# Patient Record
Sex: Male | Born: 1970 | Race: Black or African American | Hispanic: No | Smoking: Never smoker
Health system: Southern US, Community
[De-identification: ages and names within clinical notes are randomized; demographics above are authoritative.]

## PROBLEM LIST (undated history)

## (undated) DIAGNOSIS — I3139 Other pericardial effusion (noninflammatory): Secondary | ICD-10-CM

## (undated) DIAGNOSIS — R569 Unspecified convulsions: Secondary | ICD-10-CM

## (undated) DIAGNOSIS — D649 Anemia, unspecified: Secondary | ICD-10-CM

## (undated) DIAGNOSIS — E785 Hyperlipidemia, unspecified: Secondary | ICD-10-CM

## (undated) DIAGNOSIS — I714 Abdominal aortic aneurysm, without rupture, unspecified: Secondary | ICD-10-CM

## (undated) DIAGNOSIS — N289 Disorder of kidney and ureter, unspecified: Secondary | ICD-10-CM

## (undated) DIAGNOSIS — I214 Non-ST elevation (NSTEMI) myocardial infarction: Secondary | ICD-10-CM

## (undated) DIAGNOSIS — I10 Essential (primary) hypertension: Secondary | ICD-10-CM

## (undated) DIAGNOSIS — N186 End stage renal disease: Secondary | ICD-10-CM

## (undated) DIAGNOSIS — I639 Cerebral infarction, unspecified: Secondary | ICD-10-CM

## (undated) DIAGNOSIS — I5042 Chronic combined systolic (congestive) and diastolic (congestive) heart failure: Secondary | ICD-10-CM

## (undated) DIAGNOSIS — K219 Gastro-esophageal reflux disease without esophagitis: Secondary | ICD-10-CM

## (undated) DIAGNOSIS — N051 Unspecified nephritic syndrome with focal and segmental glomerular lesions: Secondary | ICD-10-CM

## (undated) DIAGNOSIS — I313 Pericardial effusion (noninflammatory): Secondary | ICD-10-CM

## (undated) DIAGNOSIS — Z992 Dependence on renal dialysis: Secondary | ICD-10-CM

## (undated) HISTORY — DX: End stage renal disease: N18.6

## (undated) HISTORY — DX: Abdominal aortic aneurysm, without rupture, unspecified: I71.40

## (undated) HISTORY — DX: Abdominal aortic aneurysm, without rupture: I71.4

## (undated) HISTORY — PX: ABDOMINAL AORTIC ANEURYSM REPAIR: SUR1152

## (undated) HISTORY — DX: Pericardial effusion (noninflammatory): I31.3

## (undated) HISTORY — PX: KIDNEY SURGERY: SHX687

## (undated) HISTORY — DX: Other pericardial effusion (noninflammatory): I31.39

---

## 1997-11-30 ENCOUNTER — Emergency Department (HOSPITAL_COMMUNITY): Admission: EM | Admit: 1997-11-30 | Discharge: 1997-11-30 | Payer: Self-pay | Admitting: Emergency Medicine

## 2016-03-20 DIAGNOSIS — Z01818 Encounter for other preprocedural examination: Secondary | ICD-10-CM | POA: Insufficient documentation

## 2016-03-20 DIAGNOSIS — I723 Aneurysm of iliac artery: Secondary | ICD-10-CM | POA: Insufficient documentation

## 2016-07-21 DIAGNOSIS — I714 Abdominal aortic aneurysm, without rupture, unspecified: Secondary | ICD-10-CM | POA: Insufficient documentation

## 2016-09-13 DIAGNOSIS — Z8673 Personal history of transient ischemic attack (TIA), and cerebral infarction without residual deficits: Secondary | ICD-10-CM | POA: Insufficient documentation

## 2016-09-13 DIAGNOSIS — R002 Palpitations: Secondary | ICD-10-CM | POA: Insufficient documentation

## 2017-02-06 DIAGNOSIS — N051 Unspecified nephritic syndrome with focal and segmental glomerular lesions: Secondary | ICD-10-CM | POA: Insufficient documentation

## 2017-02-13 DIAGNOSIS — R5084 Febrile nonhemolytic transfusion reaction: Secondary | ICD-10-CM | POA: Insufficient documentation

## 2017-02-14 DIAGNOSIS — R0689 Other abnormalities of breathing: Secondary | ICD-10-CM | POA: Insufficient documentation

## 2017-02-14 DIAGNOSIS — G8918 Other acute postprocedural pain: Secondary | ICD-10-CM | POA: Insufficient documentation

## 2017-06-20 ENCOUNTER — Emergency Department (HOSPITAL_COMMUNITY): Payer: Medicare Other

## 2017-06-20 ENCOUNTER — Inpatient Hospital Stay (HOSPITAL_COMMUNITY)
Admission: EM | Admit: 2017-06-20 | Discharge: 2017-06-20 | DRG: 291 | Discharge status: Against Medical Advice | Payer: Medicare Other | Attending: Internal Medicine | Admitting: Internal Medicine

## 2017-06-20 ENCOUNTER — Encounter (HOSPITAL_COMMUNITY): Payer: Self-pay | Admitting: Emergency Medicine

## 2017-06-20 ENCOUNTER — Other Ambulatory Visit: Payer: Self-pay

## 2017-06-20 DIAGNOSIS — E785 Hyperlipidemia, unspecified: Secondary | ICD-10-CM

## 2017-06-20 DIAGNOSIS — I5033 Acute on chronic diastolic (congestive) heart failure: Secondary | ICD-10-CM | POA: Diagnosis present

## 2017-06-20 DIAGNOSIS — R0602 Shortness of breath: Secondary | ICD-10-CM | POA: Diagnosis present

## 2017-06-20 DIAGNOSIS — I132 Hypertensive heart and chronic kidney disease with heart failure and with stage 5 chronic kidney disease, or end stage renal disease: Secondary | ICD-10-CM | POA: Diagnosis present

## 2017-06-20 DIAGNOSIS — E876 Hypokalemia: Secondary | ICD-10-CM | POA: Diagnosis present

## 2017-06-20 DIAGNOSIS — R7989 Other specified abnormal findings of blood chemistry: Secondary | ICD-10-CM | POA: Diagnosis present

## 2017-06-20 DIAGNOSIS — I160 Hypertensive urgency: Secondary | ICD-10-CM | POA: Diagnosis present

## 2017-06-20 DIAGNOSIS — E8770 Fluid overload, unspecified: Secondary | ICD-10-CM | POA: Diagnosis present

## 2017-06-20 DIAGNOSIS — N186 End stage renal disease: Secondary | ICD-10-CM

## 2017-06-20 DIAGNOSIS — Z992 Dependence on renal dialysis: Secondary | ICD-10-CM

## 2017-06-20 DIAGNOSIS — I639 Cerebral infarction, unspecified: Secondary | ICD-10-CM | POA: Diagnosis present

## 2017-06-20 DIAGNOSIS — Z8249 Family history of ischemic heart disease and other diseases of the circulatory system: Secondary | ICD-10-CM

## 2017-06-20 DIAGNOSIS — E875 Hyperkalemia: Secondary | ICD-10-CM | POA: Diagnosis present

## 2017-06-20 DIAGNOSIS — K5900 Constipation, unspecified: Secondary | ICD-10-CM | POA: Diagnosis present

## 2017-06-20 DIAGNOSIS — G40909 Epilepsy, unspecified, not intractable, without status epilepticus: Secondary | ICD-10-CM | POA: Diagnosis present

## 2017-06-20 DIAGNOSIS — D631 Anemia in chronic kidney disease: Secondary | ICD-10-CM | POA: Diagnosis present

## 2017-06-20 DIAGNOSIS — Z8673 Personal history of transient ischemic attack (TIA), and cerebral infarction without residual deficits: Secondary | ICD-10-CM | POA: Diagnosis not present

## 2017-06-20 DIAGNOSIS — K219 Gastro-esophageal reflux disease without esophagitis: Secondary | ICD-10-CM | POA: Diagnosis present

## 2017-06-20 DIAGNOSIS — R569 Unspecified convulsions: Secondary | ICD-10-CM

## 2017-06-20 DIAGNOSIS — R945 Abnormal results of liver function studies: Secondary | ICD-10-CM | POA: Diagnosis not present

## 2017-06-20 HISTORY — DX: Unspecified nephritic syndrome with focal and segmental glomerular lesions: N05.1

## 2017-06-20 HISTORY — DX: Cerebral infarction, unspecified: I63.9

## 2017-06-20 HISTORY — DX: Dependence on renal dialysis: N18.6

## 2017-06-20 HISTORY — DX: Unspecified convulsions: R56.9

## 2017-06-20 HISTORY — DX: Essential (primary) hypertension: I10

## 2017-06-20 HISTORY — DX: Hyperlipidemia, unspecified: E78.5

## 2017-06-20 HISTORY — DX: Gastro-esophageal reflux disease without esophagitis: K21.9

## 2017-06-20 HISTORY — DX: Disorder of kidney and ureter, unspecified: N28.9

## 2017-06-20 HISTORY — DX: Dependence on renal dialysis: Z99.2

## 2017-06-20 LAB — CBC WITH DIFFERENTIAL/PLATELET
BASOS PCT: 0 %
Basophils Absolute: 0 10*3/uL (ref 0.0–0.1)
EOS ABS: 0.3 10*3/uL (ref 0.0–0.7)
EOS PCT: 3 %
HCT: 20.9 % — ABNORMAL LOW (ref 39.0–52.0)
Hemoglobin: 7 g/dL — ABNORMAL LOW (ref 13.0–17.0)
Lymphocytes Relative: 19 %
Lymphs Abs: 1.5 10*3/uL (ref 0.7–4.0)
MCH: 28.7 pg (ref 26.0–34.0)
MCHC: 33.5 g/dL (ref 30.0–36.0)
MCV: 85.7 fL (ref 78.0–100.0)
MONO ABS: 0.6 10*3/uL (ref 0.1–1.0)
MONOS PCT: 7 %
Neutro Abs: 5.6 10*3/uL (ref 1.7–7.7)
Neutrophils Relative %: 71 %
Platelets: 222 10*3/uL (ref 150–400)
RBC: 2.44 MIL/uL — ABNORMAL LOW (ref 4.22–5.81)
RDW: 16.4 % — AB (ref 11.5–15.5)
WBC: 8 10*3/uL (ref 4.0–10.5)

## 2017-06-20 LAB — COMPREHENSIVE METABOLIC PANEL
ALBUMIN: 3.2 g/dL — AB (ref 3.5–5.0)
ALT: 468 U/L — ABNORMAL HIGH (ref 17–63)
ANION GAP: 21 — AB (ref 5–15)
AST: 177 U/L — ABNORMAL HIGH (ref 15–41)
Alkaline Phosphatase: 115 U/L (ref 38–126)
BUN: 139 mg/dL — ABNORMAL HIGH (ref 6–20)
CO2: 19 mmol/L — AB (ref 22–32)
Calcium: 9.4 mg/dL (ref 8.9–10.3)
Chloride: 99 mmol/L — ABNORMAL LOW (ref 101–111)
Creatinine, Ser: 18.19 mg/dL — ABNORMAL HIGH (ref 0.61–1.24)
GFR calc non Af Amer: 3 mL/min — ABNORMAL LOW (ref >60)
GFR, EST AFRICAN AMERICAN: 3 mL/min — AB (ref >60)
GLUCOSE: 95 mg/dL (ref 65–99)
POTASSIUM: 5.2 mmol/L — AB (ref 3.5–5.1)
SODIUM: 139 mmol/L (ref 135–145)
TOTAL PROTEIN: 6.6 g/dL (ref 6.5–8.1)
Total Bilirubin: 1.1 mg/dL (ref 0.3–1.2)

## 2017-06-20 LAB — HEPATITIS PANEL, ACUTE
HEP B S AG: NEGATIVE
Hep A IgM: NEGATIVE
Hep B C IgM: NEGATIVE

## 2017-06-20 LAB — ABO/RH: ABO/RH(D): A POS

## 2017-06-20 LAB — ACETAMINOPHEN LEVEL: Acetaminophen (Tylenol), Serum: <10 ug/mL — ABNORMAL LOW (ref 10–30)

## 2017-06-20 LAB — TYPE AND SCREEN
ABO/RH(D): A POS
ANTIBODY SCREEN: NEGATIVE

## 2017-06-20 LAB — HIV ANTIBODY (ROUTINE TESTING W REFLEX): HIV Screen 4th Generation wRfx: NONREACTIVE

## 2017-06-20 MED ORDER — SEVELAMER CARBONATE 800 MG PO TABS
3200.0000 mg | ORAL_TABLET | Freq: Three times a day (TID) | ORAL | Status: DC
  Filled 2017-06-20 (×4): qty 4

## 2017-06-20 MED ORDER — NITROGLYCERIN IN D5W 200-5 MCG/ML-% IV SOLN
0.0000 ug/min | Freq: Once | INTRAVENOUS | Status: DC
  Filled 2017-06-20: qty 250

## 2017-06-20 MED ORDER — SENNOSIDES-DOCUSATE SODIUM 8.6-50 MG PO TABS: 1.0000 | ORAL_TABLET | Freq: Every evening | ORAL | Status: DC | PRN

## 2017-06-20 MED ORDER — HYDRALAZINE HCL 25 MG PO TABS
100.0000 mg | ORAL_TABLET | Freq: Three times a day (TID) | ORAL | Status: DC
  Administered 2017-06-20: 100 mg via ORAL
  Filled 2017-06-20: qty 4

## 2017-06-20 MED ORDER — LISINOPRIL 20 MG PO TABS: 20.0000 mg | ORAL_TABLET | Freq: Every day | ORAL | Status: DC

## 2017-06-20 MED ORDER — ATORVASTATIN CALCIUM 40 MG PO TABS
40.0000 mg | ORAL_TABLET | Freq: Every day | ORAL | Status: DC
  Filled 2017-06-20: qty 1

## 2017-06-20 MED ORDER — LEVETIRACETAM 500 MG PO TABS: 500.0000 mg | ORAL_TABLET | Freq: Two times a day (BID) | ORAL | Status: DC

## 2017-06-20 MED ORDER — HYDRALAZINE HCL 20 MG/ML IJ SOLN
10.0000 mg | Freq: Once | INTRAMUSCULAR | Status: DC
Start: 2017-06-20 — End: 2017-06-20
  Filled 2017-06-20: qty 1

## 2017-06-20 MED ORDER — PANTOPRAZOLE SODIUM 40 MG PO TBEC: 40.0000 mg | DELAYED_RELEASE_TABLET | Freq: Every day | ORAL | Status: DC

## 2017-06-20 MED ORDER — CALCITRIOL 0.5 MCG PO CAPS
0.5000 ug | ORAL_CAPSULE | Freq: Every day | ORAL | Status: DC
  Filled 2017-06-20: qty 1

## 2017-06-20 MED ORDER — HYDRALAZINE HCL 20 MG/ML IJ SOLN
10.0000 mg | Freq: Once | INTRAMUSCULAR | Status: AC
  Administered 2017-06-20: 10 mg via INTRAVENOUS

## 2017-06-20 MED ORDER — VITAMIN D 1000 UNITS PO TABS: 1000.0000 [IU] | ORAL_TABLET | Freq: Every day | ORAL | Status: DC

## 2017-06-20 MED ORDER — CLONIDINE HCL 0.2 MG PO TABS: 0.2000 mg | ORAL_TABLET | Freq: Two times a day (BID) | ORAL | Status: DC

## 2017-06-20 MED ORDER — HEPARIN SODIUM (PORCINE) 5000 UNIT/ML IJ SOLN: 5000.0000 [IU] | Freq: Three times a day (TID) | INTRAMUSCULAR | Status: DC

## 2017-06-20 MED ORDER — ZOLPIDEM TARTRATE 5 MG PO TABS: 5.0000 mg | ORAL_TABLET | Freq: Every evening | ORAL | Status: DC | PRN

## 2017-06-20 MED ORDER — ASPIRIN EC 81 MG PO TBEC: 81.0000 mg | DELAYED_RELEASE_TABLET | Freq: Every day | ORAL | Status: DC

## 2017-06-20 MED ORDER — POLYETHYLENE GLYCOL 3350 17 G PO PACK: 17.0000 g | PACK | Freq: Every day | ORAL | Status: DC

## 2017-06-20 MED ORDER — FUROSEMIDE 20 MG PO TABS: 80.0000 mg | ORAL_TABLET | Freq: Every day | ORAL | Status: DC

## 2017-06-20 MED ORDER — CARVEDILOL 12.5 MG PO TABS: 25.0000 mg | ORAL_TABLET | Freq: Two times a day (BID) | ORAL | Status: DC

## 2017-06-20 MED ORDER — HYDRALAZINE HCL 20 MG/ML IJ SOLN: 5.0000 mg | INTRAMUSCULAR | Status: DC | PRN

## 2017-06-20 MED ORDER — ONDANSETRON HCL 4 MG/2ML IJ SOLN: 4.0000 mg | Freq: Three times a day (TID) | INTRAMUSCULAR | Status: DC | PRN

## 2017-06-20 NOTE — ED Triage Notes (Signed)
Pt st's he is from  [place] and has not been to dialysis since last Thursday.  Pt to ED with c/o shortness of breath and non productive cough

## 2017-06-20 NOTE — ED Notes (Signed)
Another RN attempted to start IV on pt. Pt refused to let the RN attempt IV access.  States he needs treatment soon because he "needs to go back to [place]".  RN explained that pt's treatment involves establishing an IV.  Pt continues to refuse.  EDP Rancour notified.

## 2017-06-20 NOTE — ED Notes (Signed)
EDP Rancour at bedside  

## 2017-06-20 NOTE — ED Notes (Signed)
IV team RN at bedside with primary RN.  Pt continues to refuse treatment. Pt states "if I can't be guaranteed 100% that I will be on the dialysis machine by 7a then I cannot stay".  Advised pt that his blood pressure is dangerously high and the risks associated.  Pt states "im not worried about that blood pressure, it's always that high or higher"  Pt states he needs to return to [place] by noon to be with his son.  Pt only consents to any treatment he can receive for constipation but declines all other treatment at this time.  EDP Rancour notified.

## 2017-06-20 NOTE — ED Provider Notes (Signed)
[facility] EMERGENCY DEPARTMENT Provider Note   CSN: [id number] Arrival date & time: 06/20/17  0000     History   Chief Complaint Chief Complaint  Patient presents with  . Shortness of Breath    HPI Angel [name] is a 46 y.o. male.  Patient visiting from [place].  Feels like he needs dialysis.  He is normally Tuesday, Thursday, Saturday and has missed his last 2 sessions.  He feels he is about 2 kg over his dry weight and is having some shortness of breath and chest congestion.  Denies any cough or fever.  Still makes some urine.  Denies any chest pain.  Denies any abdominal pain, nausea or vomiting.  He is hypertensive but states compliance with his medications and states he normally has difficult to control blood pressure. Patient had a recent open aortic aneurysm repair in July at [facility].   The history is provided by the patient.  Shortness of Breath  Pertinent negatives include no fever, no headaches, no rhinorrhea, no chest pain, no vomiting, no abdominal pain, no rash and no leg swelling.    Past Medical History:  Diagnosis Date  . CHF (congestive heart failure) ([place])   . Hypertension   . Renal disorder     There are no active problems to display for this patient.   Past Surgical History:  Procedure Laterality Date  . ABDOMINAL AORTIC ANEURYSM REPAIR    . KIDNEY SURGERY         Home Medications    Prior to Admission medications   Not on File    Family History No family history on file.  Social History Social History   Tobacco Use  . Smoking status: Never Smoker  . Smokeless tobacco: Never Used  Substance Use Topics  . Alcohol use: No    Frequency: Never  . Drug use: No     Allergies   Patient has no allergy information on record.   Review of Systems Review of Systems  Constitutional: Positive for activity change, appetite change and fatigue. Negative for fever.  HENT: Negative for congestion and  rhinorrhea.   Eyes: Negative for visual disturbance.  Respiratory: Positive for chest tightness and shortness of breath.   Cardiovascular: Negative for chest pain and leg swelling.  Gastrointestinal: Negative for abdominal pain, nausea and vomiting.  Genitourinary: Negative for dysuria and hematuria.  Musculoskeletal: Negative for arthralgias.  Skin: Negative for rash.  Neurological: Negative for dizziness, weakness and headaches.    all other systems are negative except as noted in the HPI and PMH.    Physical Exam Updated Vital Signs BP (!) 173/137   Pulse 89   Temp 98.2 F (36.8 C) (Oral)   Resp (!) 23   Ht 5\' 5"  (1.651 m)   Wt 58 kg (127 lb 13.9 oz)   SpO2 98%   BMI 21.28 kg/m   Physical Exam  Constitutional: He is oriented to person, place, and time. He appears well-developed and well-nourished. No distress.  Speaking in full sentences  HENT:  Head: Normocephalic and atraumatic.  Mouth/Throat: Oropharynx is clear and moist. No oropharyngeal exudate.  Eyes: Conjunctivae and EOM are normal. Pupils are equal, round, and reactive to light.  Neck: Normal range of motion. Neck supple.  No meningismus.  Cardiovascular: Normal rate, regular rhythm, normal heart sounds and intact distal pulses.  No murmur heard. Pulmonary/Chest: Effort normal and breath sounds normal. No respiratory distress.  Bibasilar crackles Dialysis catheter R chest  Abdominal: Soft. There is no tenderness. There is no rebound and no guarding.  Well healed midline abdominal incision  Musculoskeletal: Normal range of motion. He exhibits edema. He exhibits no tenderness.  +1 pretibial edema  Neurological: He is alert and oriented to person, place, and time. No cranial nerve deficit. He exhibits normal muscle tone. Coordination normal.  No ataxia on finger to nose bilaterally. No pronator drift. 5/5 strength throughout. CN 2-12 intact.Equal grip strength. Sensation intact.   Skin: Skin is warm.    Psychiatric: He has a normal mood and affect. His behavior is normal.  Nursing note and vitals reviewed.    ED Treatments / Results  Labs (all labs ordered are listed, but only abnormal results are displayed) Labs Reviewed  CBC WITH DIFFERENTIAL/PLATELET - Abnormal; Notable for the following components:      Result Value   RBC 2.44 (*)    Hemoglobin 7.0 (*)    HCT 20.9 (*)    RDW 16.4 (*)    All other components within normal limits  COMPREHENSIVE METABOLIC PANEL - Abnormal; Notable for the following components:   Potassium 5.2 (*)    Chloride 99 (*)    CO2 19 (*)    BUN 139 (*)    Creatinine, Ser 18.19 (*)    Albumin 3.2 (*)    AST 177 (*)    ALT 468 (*)    GFR calc non Af Amer 3 (*)    GFR calc Af Amer 3 (*)    Anion gap 21 (*)    All other components within normal limits  TYPE AND SCREEN  ABO/RH    EKG  EKG Interpretation  Date/Time:  Wednesday June 20 2017 00:11:14 EST Ventricular Rate:  101 PR Interval:  118 QRS Duration: 86 QT Interval:  358 QTC Calculation: 464 R Axis:   -12 Text Interpretation:  Sinus tachycardia Minimal voltage criteria for LVH, may be normal variant Nonspecific ST and T wave abnormality Abnormal ECG No previous ECGs available Confirmed by Ezequiel Essex [phone]) on 06/20/2017 4:02:28 AM       Radiology Dg Chest 2 View  Result Date: 06/20/2017 CLINICAL DATA:  Acute onset of shortness of breath. EXAM: CHEST  2 VIEW COMPARISON:  None. FINDINGS: The lungs are well-aerated and clear. There is no evidence of focal opacification, pleural effusion or pneumothorax. The heart is mildly enlarged. A right-sided dual-lumen catheter is noted ending at the right atrium. No acute osseous abnormalities are seen. IMPRESSION: Mild cardiomegaly.  Lungs remain grossly clear. Electronically Signed   By: Garald Balding M.D.   On: 06/20/2017 01:43    Procedures Procedures (including critical care time)  Medications Ordered in ED Medications   hydrALAZINE (APRESOLINE) injection 10 mg (not administered)     Initial Impression / Assessment and Plan / ED Course  I have reviewed the triage vital signs and the nursing notes.  Pertinent labs & imaging results that were available during my care of the patient were reviewed by me and considered in my medical decision making (see chart for details).    Patient requesting dialysis.  States he has missed his last 2 sessions and feels short of breath and 2 kg over his dry weight.  No chest pain.  He is in not in any respiratory distress.  He is not hypoxic.  His blood pressure is elevated. Chest x-ray does not show any pleural effusions.  There is minimal edema.  Potassium is 5.2 without EKG changes.  Hemoglobin is  7.0 which appears to be similar to previous values in care everywhere.  Hydralazine given and NTG started for hypertensive urgency.  D/w Dr. Jimmy Footman of nephrology who will arrange for dialysis.   Admission d/w Dr. Blaine Hamper.  Patient states he will not stay if he cannot have dialysis by 8am. He was informed that this cannot be guaranteed.   CRITICAL CARE Performed by: Ezequiel Essex Total critical care time: 31 minutes Critical care time was exclusive of separately billable procedures and treating other patients. Critical care was necessary to treat or prevent imminent or life-threatening deterioration. Critical care was time spent personally by me on the following activities: development of treatment plan with patient and/or surrogate as well as nursing, discussions with consultants, evaluation of patient's response to treatment, examination of patient, obtaining history from patient or surrogate, ordering and performing treatments and interventions, ordering and review of laboratory studies, ordering and review of radiographic studies, pulse oximetry and re-evaluation of patient's condition.  Final Clinical Impressions(s) / ED Diagnoses   Final diagnoses:  Hypervolemia,  unspecified hypervolemia type  Hyperkalemia  Hypertensive urgency    ED Discharge Orders    None       Marshell Dilauro, Annie Main, MD 06/20/17 [phone]

## 2017-06-20 NOTE — Discharge Summary (Signed)
Physician Discharge Summary  Angel [name] MWN:[id number] DOB: [birth year] DOA: 06/20/2017  PCP: Gwenlyn Saran, MD  Admit date: 06/20/2017 Discharge date: 06/20/2017  Time spent: 35 minutes  Recommendations for Outpatient Follow-up:   Did not make follow-up appointment since pt left hospital on AMA, I did not have chance to see pt before he left.  Discharge Diagnoses:  Principal Problem:   Fluid overload Active Problems:   HLD (hyperlipidemia)   Stroke (cerebrum) ([name])   ESRD on dialysis ([name])   Acute on chronic diastolic CHF (congestive heart failure) ([name])   Hypertensive urgency   Hyperkalemia   GERD (gastroesophageal reflux disease)   Seizure ([name])   Abnormal LFTs   Anemia in ESRD (end-stage renal disease) ([place])   Discharge Condition: still has SOB and elevated Bp at 179/138, hemodialysis was not done.  Diet recommendation:  Did not make recommendations since pt left hospital on AMA, I did not have chance to see pt before he left.  Filed Weights   06/20/17 0029  Weight: 58 kg (127 lb 13.9 oz)    History of present illness:  Angel [name] is a 46 y.o. male with medical history significant of ESRD-HD, hypertension, hyperlipidemia, stroke, GERD, CHF, seizure, s/p of AAA repair, who presents with SOB.  Patient states that he has missed his last 2 sessions of hemodialysis.He feels he is about 2 kg over his dry weight. He developed shortness rest, which has been progressively getting worse. He also has chest congestion, but no chest pain. He has a mild dry cough, but no fever or chills. Denies nausea, vomiting, diarrhea or abdominal pain. He states that he is constipated recently. No symptoms of UTI or unilateral weakness. Patient had a recent open aortic aneurysm repair in July at [facility].  ED Course: pt was found to have potassium of 5.2, bicarbonate 19, creatinine 18.19, BUN 139, WBC 8.0, hemoglobin 7.0 which was 9.9 on 02/17/17, abnormal liver function with  ALP 115, AST 177, ALT 468, total bilirubin 1.1, elevated blood pressure 198/143, tachycardia, tachypnea, oxygen saturation 98% on room air, temperature normal. Chest x-rays negative for infiltration, but showed mild cardiomegaly. Patient is admitted to stepdown as inpatient.  Hospital Course:   Pt was admitted to SDU as inpt. Assessment & plan was made as the followings, but unfortunately patient left hospital on AMA. I did not have chance to see pt before he left. Per EDP's note, "patient did not want to wait any longer in the emergency department for his dialysis.  No evidence of psychosis.  He decided independently to leave [place]".   Fluid overload and acute on chronic diastolic CHF: Patient missed 2 sessions of dialysis, presented with SOB. Most likely due to fluid overload given  1+ leg edema and positive JVD. He also has hypertensive urgency with blood pressure 189/143. Patient does not have chest pain. 2-D Echo 11/80/17 showed EF of 50-55 percent  -will admit to SDU -nitro gtt was ordered -dialysis per renal  -Bp control as below  ESRD on dialysis (TTS): missed 2 sessions of HD. 5.2, bicarbonate 19, creatinine 18.19, BUN 139 -Left message to renal box for HD -Continue Renvela and Calcitriol  Mild hypokalemia: potassium 5.2 -Per dialysis  Hypertensive urgency: Patient states that he has been compliant to taking his medications. -IV hydralazine when necessary -Continue home blood pressure medications: Coreg, clonidine, hydralazine, lisinopril -Patient is also on Lasix  HLD: -lipitor  Hx of Stroke (cerebrum) ([name]) -on ASA and lipitor  GERD: -Protonix  Seizure:  -Seizure precaution -Continue Keppra -check Keppra level  Abnormal LFTs: Etiology is not clear. ALP 115, AST 177, ALT 468, total bilirubin 1.1, -Check Tylenol level -Hepatitis panel  Anemia in ESRD (end-stage renal disease) ([place]): Hemoglobin dropped from 9.9 on 02/17/17-7.0. No active  bleeding -Check FOBT -Follow-up by CBC  Constipation: -MiraLAX and senokote   Procedures:  none  Consultations:  Renal for hemodialysis, but hemodialysis was not done yet  Discharge Exam: Vitals:   06/20/17 0600 06/20/17 0615  BP: (!) 179/138 (!) 179/139  Pulse: 93 92  Resp: 20 (!) 24  Temp:    SpO2: 100% 99%     I did not have chance to to physical examination since pt left hospital on [facility]   Discharge Instructions  Did not give instruction since pt left hospital on AMA, I did not have chance to see pt before he left.   Current Discharge Medication List    CONTINUE these medications which have NOT CHANGED   Details  aspirin EC 81 MG tablet Take 81 mg daily by mouth.    atorvastatin (LIPITOR) 40 MG tablet Take 40 mg at bedtime by mouth.    carvedilol (COREG) 25 MG tablet Take 25 mg 2 (two) times daily with a meal by mouth.    cloNIDine (CATAPRES) 0.2 MG tablet Take 0.2 mg 2 (two) times daily by mouth.    furosemide (LASIX) 80 MG tablet Take 80 mg at bedtime by mouth.    hydrALAZINE (APRESOLINE) 50 MG tablet Take 100 mg 3 (three) times daily by mouth.    lisinopril (PRINIVIL,ZESTRIL) 40 MG tablet Take 40 mg daily by mouth.       No Known Allergies    The results of significant diagnostics from this hospitalization (including imaging, microbiology, ancillary and laboratory) are listed below for reference.    Significant Diagnostic Studies: Dg Chest 2 View  Result Date: 06/20/2017 CLINICAL DATA:  Acute onset of shortness of breath. EXAM: CHEST  2 VIEW COMPARISON:  None. FINDINGS: The lungs are well-aerated and clear. There is no evidence of focal opacification, pleural effusion or pneumothorax. The heart is mildly enlarged. A right-sided dual-lumen catheter is noted ending at the right atrium. No acute osseous abnormalities are seen. IMPRESSION: Mild cardiomegaly.  Lungs remain grossly clear. Electronically Signed   By: Garald Balding M.D.   On: 06/20/2017  01:43    Microbiology: No results found for this or any previous visit (from the past 240 hour(s)).   Labs: Basic Metabolic Panel: Recent Labs  Lab 06/20/17 0036  NA 139  K 5.2*  CL 99*  CO2 19*  GLUCOSE 95  BUN 139*  CREATININE 18.19*  CALCIUM 9.4   Liver Function Tests: Recent Labs  Lab 06/20/17 0036  AST 177*  ALT 468*  ALKPHOS 115  BILITOT 1.1  PROT 6.6  ALBUMIN 3.2*   No results for input(s): LIPASE, AMYLASE in the last 168 hours. No results for input(s): AMMONIA in the last 168 hours. CBC: Recent Labs  Lab 06/20/17 0036  WBC 8.0  NEUTROABS 5.6  HGB 7.0*  HCT 20.9*  MCV 85.7  PLT 222   Cardiac Enzymes: No results for input(s): CKTOTAL, CKMB, CKMBINDEX, TROPONINI in the last 168 hours. BNP: BNP (last 3 results) No results for input(s): BNP in the last 8760 hours.  ProBNP (last 3 results) No results for input(s): PROBNP in the last 8760 hours.  CBG: No results for input(s): GLUCAP in the last 168 hours.  Signed:  Ivor Costa  [facility] 06/20/2017, 5:26 PM

## 2017-06-20 NOTE — ED Notes (Signed)
RN attempted IV without success.  Pt states he was "stuck  27 times" last admission and would prefer IV team to assist with access.

## 2017-06-20 NOTE — H&P (Signed)
History and Physical    Angel [name] SVX:[id number] DOB: 09-Apr-1971 DOA: 06/20/2017  Referring MD/NP/PA:   PCP: Gwenlyn Saran, MD   Patient coming from:  The patient is coming from home.  At baseline, pt is independent for most of ADL.   Chief Complaint: SOB  HPI: Angel [name] is a 46 y.o. male with medical history significant of ESRD-HD, hypertension, hyperlipidemia, stroke, GERD, CHF, seizure, s/p of AAA repair, who presents with SOB.  Patient states that he has missed his last 2 sessions of hemodialysis. He feels he is about 2 kg over his dry weight. He developed shortness rest, which has been progressively getting worse. He also has chest congestion, but no chest pain. He has a mild dry cough, but no fever or chills. Denies nausea, vomiting, diarrhea or abdominal pain. He states that he is constipated recently. No symptoms of UTI or unilateral weakness. Patient had a recent open aortic aneurysm repair in July at [facility].  ED Course: pt was found to have potassium of 5.2, bicarbonate 19, creatinine 18.19, BUN 139, WBC 8.0, hemoglobin 7.0 which was 9.9 on 02/17/17, abnormal liver function with ALP 115, AST 177, ALT 468, total bilirubin 1.1, elevated blood pressure 198/143, tachycardia, tachypnea, oxygen saturation 98% on room air, temperature normal. Chest x-rays negative for infiltration, but showed mild cardiomegaly. Patient is admitted to stepdown as inpatient.  Review of Systems:   General: no fevers, chills, no body weight gain, has poor appetite, has fatigue HEENT: no blurry vision, hearing changes or sore throat Respiratory: has dyspnea, coughing, no wheezing CV: no chest pain, no palpitations GI: no nausea, vomiting, abdominal pain, diarrhea, has constipation GU: no dysuria, burning on urination, increased urinary frequency, hematuria  Ext: has leg edema Neuro: no unilateral weakness, numbness, or tingling, no vision change or hearing loss Skin: no rash, no  skin tear. MSK: No muscle spasm, no deformity, no limitation of range of movement in spin Heme: No easy bruising.  Travel history: No recent long distant travel.  Allergy: No Known Allergies  Past Medical History:  Diagnosis Date  . CHF (congestive heart failure) ([place])   . ESRD on dialysis ([place])   . FSGS (focal segmental glomerulosclerosis)   . GERD (gastroesophageal reflux disease)   . HLD (hyperlipidemia)   . Hypertension   . Renal disorder   . Seizure ([place])   . Stroke (cerebrum) [facility])     Past Surgical History:  Procedure Laterality Date  . ABDOMINAL AORTIC ANEURYSM REPAIR    . KIDNEY SURGERY      Social History:  reports that  has never smoked. he has never used smokeless tobacco. He reports that he does not drink alcohol or use drugs.  Family History:  Family History  Problem Relation Age of Onset  . Hypertension Mother   . Kidney disease Mother   . Asthma Father   . Hypertension Father   . Kidney disease Father   . Hypertension Sister   . Heart disease Sister   . Kidney disease Sister   . Heart disease Brother   . Hypertension Brother   . COPD Brother      Prior to Admission medications   Not on File    Physical Exam: Vitals:   06/20/17 0500 06/20/17 0515 06/20/17 0530 06/20/17 0600  BP: (!) 173/135 (!) 177/132 (!) 178/143 (!) 179/138  Pulse: 90 92 93 93  Resp: (!) 21 (!) 24 (!) 25 20  Temp:  TempSrc:      SpO2: 100% 97% 99% 100%  Weight:      Height:       General: Not in acute distress HEENT:       Eyes: PERRL, EOMI, no scleral icterus.       ENT: No discharge from the ears and nose, no pharynx injection, no tonsillar enlargement.        Neck: positive JVD, no bruit, no mass felt. Heme: No neck lymph node enlargement. Cardiac: S1/S2, RRR, No murmurs, No gallops or rubs. Respiratory:  No rales, wheezing, rhonchi or rubs. GI: Soft, nondistended, nontender, no rebound pain, no organomegaly, BS present. GU: No hematuria Ext: 1+ pitting leg  edema bilaterally. 2+DP/PT pulse bilaterally. Musculoskeletal: No joint deformities, No joint redness or warmth, no limitation of ROM in spin. Skin: No rashes.  Neuro: Alert, oriented X3, cranial nerves II-XII grossly intact, moves all extremities normally.  Psych: Patient is not psychotic, no suicidal or hemocidal ideation.  Labs on Admission: I have personally reviewed following labs and imaging studies  CBC: Recent Labs  Lab 06/20/17 0036  WBC 8.0  NEUTROABS 5.6  HGB 7.0*  HCT 20.9*  MCV 85.7  PLT 144   Basic Metabolic Panel: Recent Labs  Lab 06/20/17 0036  NA 139  K 5.2*  CL 99*  CO2 19*  GLUCOSE 95  BUN 139*  CREATININE 18.19*  CALCIUM 9.4   GFR: Estimated Creatinine Clearance: 4.2 mL/min (A) (by C-G formula based on SCr of 18.19 mg/dL (H)). Liver Function Tests: Recent Labs  Lab 06/20/17 0036  AST 177*  ALT 468*  ALKPHOS 115  BILITOT 1.1  PROT 6.6  ALBUMIN 3.2*   No results for input(s): LIPASE, AMYLASE in the last 168 hours. No results for input(s): AMMONIA in the last 168 hours. Coagulation Profile: No results for input(s): INR, PROTIME in the last 168 hours. Cardiac Enzymes: No results for input(s): CKTOTAL, CKMB, CKMBINDEX, TROPONINI in the last 168 hours. BNP (last 3 results) No results for input(s): PROBNP in the last 8760 hours. HbA1C: No results for input(s): HGBA1C in the last 72 hours. CBG: No results for input(s): GLUCAP in the last 168 hours. Lipid Profile: No results for input(s): CHOL, HDL, LDLCALC, TRIG, CHOLHDL, LDLDIRECT in the last 72 hours. Thyroid Function Tests: No results for input(s): TSH, T4TOTAL, FREET4, T3FREE, THYROIDAB in the last 72 hours. Anemia Panel: No results for input(s): VITAMINB12, FOLATE, FERRITIN, TIBC, IRON, RETICCTPCT in the last 72 hours. Urine analysis: No results found for: COLORURINE, APPEARANCEUR, LABSPEC, PHURINE, GLUCOSEU, HGBUR, BILIRUBINUR, KETONESUR, PROTEINUR, UROBILINOGEN, NITRITE,  LEUKOCYTESUR Sepsis Labs: @LABRCNTIP (procalcitonin:4,lacticidven:4) )No results found for this or any previous visit (from the past 240 hour(s)).   Radiological Exams on Admission: Dg Chest 2 View  Result Date: 06/20/2017 CLINICAL DATA:  Acute onset of shortness of breath. EXAM: CHEST  2 VIEW COMPARISON:  None. FINDINGS: The lungs are well-aerated and clear. There is no evidence of focal opacification, pleural effusion or pneumothorax. The heart is mildly enlarged. A right-sided dual-lumen catheter is noted ending at the right atrium. No acute osseous abnormalities are seen. IMPRESSION: Mild cardiomegaly.  Lungs remain grossly clear. Electronically Signed   By: Garald Balding M.D.   On: 06/20/2017 01:43     EKG: Independently reviewed.  Sinus rhythm, QTC 464, mild T-wave inversion in V5-V6  Assessment/Plan Principal Problem:   Fluid overload Active Problems:   HLD (hyperlipidemia)   Stroke (cerebrum) ([name])   ESRD on dialysis ([place])   Acute  on chronic diastolic CHF (congestive heart failure) ([name])   Hypertensive urgency   Hyperkalemia   GERD (gastroesophageal reflux disease)   Seizure ([name])   Abnormal LFTs   Anemia in ESRD (end-stage renal disease) ([name])   Fluid overload and acute on chronic diastolic CHF: Patient missed 2 sessions of dialysis, presented with SOB. Most likely due to fluid overload given  1+ leg edema and positive JVD. He also has hypertensive urgency with blood pressure 189/143. Patient does not have chest pain. 2-D Echo 11/80/17 showed EF of 50-55 percent  -will admit to SDU -nitro gtt was ordered -dialysis per renal  -Bp control as below  ESRD on dialysis (TTS): missed 2 sessions of HD. 5.2, bicarbonate 19, creatinine 18.19, BUN 139 -Left message to renal box for HD -Continue Renvela and Calcitriol  Mild hypokalemia: potassium 5.2 -Per dialysis  Hypertensive urgency: Patient states that he has been compliant to taking his medications. -IV hydralazine  when necessary -Continue home blood pressure medications: Coreg, clonidine, hydralazine, lisinopril -Patient is also on Lasix  HLD: -lipitor  Hx of Stroke (cerebrum) ([name]) -on ASA and lipitor  GERD: -Protonix  Seizure:  -Seizure precaution -Continue Keppra -check Keppra level  Abnormal LFTs: Etiology is not clear. ALP 115, AST 177, ALT 468, total bilirubin 1.1, -Check Tylenol level -Hepatitis panel  Anemia in ESRD (end-stage renal disease) ([place]): Hemoglobin dropped from 9.9 on 02/17/17-7.0. No active bleeding -Check FOBT -Follow-up by CBC  Constipation: -MiraLAX and senokote    DVT ppx: SQ Heparin    Code Status: Full code Family Communication: None at bed side.    Disposition Plan:  Anticipate discharge back to previous home environment Consults called:  none Admission status:   SDU/inpation       Date of Service 06/20/2017    Neysha Criado, [place] Pager [phone]  If 7PM-7AM, please contact night-coverage [email or web address] Password [facility] 06/20/2017, 6:28 AM

## 2017-06-20 NOTE — ED Provider Notes (Signed)
Patient did not want to wait any longer in the emergency department for his dialysis.  No evidence of psychosis.  He decided independently to leave [place].   Nat Christen, MD 06/20/17 [phone]

## 2017-06-20 NOTE — Discharge Instructions (Signed)
Follow-up your kidney doctor at home.

## 2017-06-20 NOTE — Progress Notes (Signed)
Patient apparently left AMA.

## 2017-06-22 LAB — LEVETIRACETAM LEVEL: Levetiracetam Lvl: NOT DETECTED ug/mL (ref 10.0–40.0)

## 2017-06-24 ENCOUNTER — Encounter (HOSPITAL_COMMUNITY): Payer: Self-pay | Admitting: Emergency Medicine

## 2017-06-24 ENCOUNTER — Inpatient Hospital Stay (HOSPITAL_COMMUNITY)
Admission: EM | Admit: 2017-06-24 | Discharge: 2017-06-27 | DRG: 280 | Discharge status: Against Medical Advice | Payer: Medicare Other | Attending: Family Medicine | Admitting: Family Medicine

## 2017-06-24 ENCOUNTER — Emergency Department (HOSPITAL_COMMUNITY): Payer: Medicare Other

## 2017-06-24 DIAGNOSIS — I161 Hypertensive emergency: Secondary | ICD-10-CM | POA: Diagnosis present

## 2017-06-24 DIAGNOSIS — G40909 Epilepsy, unspecified, not intractable, without status epilepticus: Secondary | ICD-10-CM | POA: Diagnosis present

## 2017-06-24 DIAGNOSIS — Z8249 Family history of ischemic heart disease and other diseases of the circulatory system: Secondary | ICD-10-CM | POA: Diagnosis not present

## 2017-06-24 DIAGNOSIS — I169 Hypertensive crisis, unspecified: Secondary | ICD-10-CM

## 2017-06-24 DIAGNOSIS — Z9115 Patient's noncompliance with renal dialysis: Secondary | ICD-10-CM

## 2017-06-24 DIAGNOSIS — K219 Gastro-esophageal reflux disease without esophagitis: Secondary | ICD-10-CM | POA: Diagnosis present

## 2017-06-24 DIAGNOSIS — Z7982 Long term (current) use of aspirin: Secondary | ICD-10-CM

## 2017-06-24 DIAGNOSIS — N2581 Secondary hyperparathyroidism of renal origin: Secondary | ICD-10-CM | POA: Diagnosis present

## 2017-06-24 DIAGNOSIS — E785 Hyperlipidemia, unspecified: Secondary | ICD-10-CM | POA: Diagnosis present

## 2017-06-24 DIAGNOSIS — Z888 Allergy status to other drugs, medicaments and biological substances status: Secondary | ICD-10-CM

## 2017-06-24 DIAGNOSIS — I5040 Unspecified combined systolic (congestive) and diastolic (congestive) heart failure: Secondary | ICD-10-CM | POA: Diagnosis present

## 2017-06-24 DIAGNOSIS — I429 Cardiomyopathy, unspecified: Secondary | ICD-10-CM | POA: Diagnosis not present

## 2017-06-24 DIAGNOSIS — I132 Hypertensive heart and chronic kidney disease with heart failure and with stage 5 chronic kidney disease, or end stage renal disease: Secondary | ICD-10-CM | POA: Diagnosis present

## 2017-06-24 DIAGNOSIS — E875 Hyperkalemia: Secondary | ICD-10-CM | POA: Diagnosis present

## 2017-06-24 DIAGNOSIS — N186 End stage renal disease: Secondary | ICD-10-CM | POA: Diagnosis present

## 2017-06-24 DIAGNOSIS — Z841 Family history of disorders of kidney and ureter: Secondary | ICD-10-CM

## 2017-06-24 DIAGNOSIS — I361 Nonrheumatic tricuspid (valve) insufficiency: Secondary | ICD-10-CM | POA: Diagnosis not present

## 2017-06-24 DIAGNOSIS — Z79899 Other long term (current) drug therapy: Secondary | ICD-10-CM | POA: Diagnosis not present

## 2017-06-24 DIAGNOSIS — R748 Abnormal levels of other serum enzymes: Secondary | ICD-10-CM | POA: Diagnosis not present

## 2017-06-24 DIAGNOSIS — Z8673 Personal history of transient ischemic attack (TIA), and cerebral infarction without residual deficits: Secondary | ICD-10-CM

## 2017-06-24 DIAGNOSIS — D631 Anemia in chronic kidney disease: Secondary | ICD-10-CM | POA: Diagnosis present

## 2017-06-24 DIAGNOSIS — R0602 Shortness of breath: Secondary | ICD-10-CM

## 2017-06-24 DIAGNOSIS — I214 Non-ST elevation (NSTEMI) myocardial infarction: Secondary | ICD-10-CM | POA: Diagnosis present

## 2017-06-24 DIAGNOSIS — I10 Essential (primary) hypertension: Secondary | ICD-10-CM | POA: Diagnosis not present

## 2017-06-24 DIAGNOSIS — Z992 Dependence on renal dialysis: Secondary | ICD-10-CM | POA: Diagnosis not present

## 2017-06-24 DIAGNOSIS — I160 Hypertensive urgency: Secondary | ICD-10-CM | POA: Diagnosis present

## 2017-06-24 LAB — BASIC METABOLIC PANEL
Anion gap: 23 — ABNORMAL HIGH (ref 5–15)
BUN: 203 mg/dL — AB (ref 6–20)
CALCIUM: 9.8 mg/dL (ref 8.9–10.3)
CHLORIDE: 102 mmol/L (ref 101–111)
CO2: 16 mmol/L — ABNORMAL LOW (ref 22–32)
CREATININE: 22.62 mg/dL — AB (ref 0.61–1.24)
GFR calc Af Amer: 2 mL/min — ABNORMAL LOW (ref >60)
GFR calc non Af Amer: 2 mL/min — ABNORMAL LOW (ref >60)
Glucose, Bld: 112 mg/dL — ABNORMAL HIGH (ref 65–99)
Potassium: 5.2 mmol/L — ABNORMAL HIGH (ref 3.5–5.1)
SODIUM: 141 mmol/L (ref 135–145)

## 2017-06-24 LAB — I-STAT TROPONIN, ED: Troponin i, poc: 0.41 ng/mL (ref 0.00–0.08)

## 2017-06-24 LAB — SAMPLE TO BLOOD BANK

## 2017-06-24 LAB — CBC
HCT: 22.2 % — ABNORMAL LOW (ref 39.0–52.0)
HEMOGLOBIN: 7.5 g/dL — AB (ref 13.0–17.0)
MCH: 28.3 pg (ref 26.0–34.0)
MCHC: 33.8 g/dL (ref 30.0–36.0)
MCV: 83.8 fL (ref 78.0–100.0)
PLATELETS: 245 10*3/uL (ref 150–400)
RBC: 2.65 MIL/uL — ABNORMAL LOW (ref 4.22–5.81)
RDW: 16.1 % — AB (ref 11.5–15.5)
WBC: 7.3 10*3/uL (ref 4.0–10.5)

## 2017-06-24 MED ORDER — ACETAMINOPHEN 325 MG PO TABS
650.0000 mg | ORAL_TABLET | Freq: Four times a day (QID) | ORAL | Status: DC | PRN
  Administered 2017-06-25 (×2): 650 mg via ORAL
  Filled 2017-06-24 (×2): qty 2

## 2017-06-24 MED ORDER — SODIUM CHLORIDE 0.9 % IV SOLN
1.0000 g | Freq: Once | INTRAVENOUS | Status: AC
  Administered 2017-06-24: 1 g via INTRAVENOUS
  Filled 2017-06-24: qty 10

## 2017-06-24 MED ORDER — SODIUM BICARBONATE 8.4 % IV SOLN
50.0000 meq | Freq: Once | INTRAVENOUS | Status: AC
  Administered 2017-06-24: 50 meq via INTRAVENOUS
  Filled 2017-06-24: qty 50

## 2017-06-24 MED ORDER — ZOLPIDEM TARTRATE 5 MG PO TABS
5.0000 mg | ORAL_TABLET | Freq: Every evening | ORAL | Status: DC | PRN
  Administered 2017-06-25: 5 mg via ORAL
  Filled 2017-06-24: qty 1

## 2017-06-24 MED ORDER — ACETAMINOPHEN 650 MG RE SUPP: 650.0000 mg | Freq: Four times a day (QID) | RECTAL | Status: DC | PRN

## 2017-06-24 MED ORDER — HYDRALAZINE HCL 50 MG PO TABS
100.0000 mg | ORAL_TABLET | Freq: Three times a day (TID) | ORAL | Status: DC
  Administered 2017-06-25 (×2): 100 mg via ORAL
  Filled 2017-06-24: qty 2
  Filled 2017-06-24 (×2): qty 4
  Filled 2017-06-24: qty 2

## 2017-06-24 MED ORDER — NITROGLYCERIN IN D5W 200-5 MCG/ML-% IV SOLN
0.0000 ug/min | INTRAVENOUS | Status: DC
  Administered 2017-06-24: 5 ug/min via INTRAVENOUS
  Administered 2017-06-25: 35 ug/min via INTRAVENOUS
  Administered 2017-06-25: 20 ug/min via INTRAVENOUS
  Filled 2017-06-24: qty 250

## 2017-06-24 MED ORDER — HYDRALAZINE HCL 20 MG/ML IJ SOLN
10.0000 mg | Freq: Four times a day (QID) | INTRAMUSCULAR | Status: DC | PRN
  Administered 2017-06-25 (×2): 10 mg via INTRAVENOUS
  Filled 2017-06-24 (×3): qty 1

## 2017-06-24 MED ORDER — SODIUM POLYSTYRENE SULFONATE 15 GM/60ML PO SUSP
30.0000 g | Freq: Once | ORAL | Status: AC
  Administered 2017-06-24: 30 g via ORAL
  Filled 2017-06-24: qty 120

## 2017-06-24 NOTE — ED Provider Notes (Signed)
The patient is a 46 year old male, he has end-stage renal disease and is on dialysis, he has a Port-A-Cath in his right upper chest through which he gets dialysis.  He has not had dialysis in over 6 days.  He reports that he recently moved from [place] to this area and has not arranged a dialysis set up yet.  He is working with a Education officer, museum to do this.  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  He states that his blood pressure has been relatively well-controlled until recently when it started to go much higher despite taking the maximum doses of hydralazine clonidine and carvedilol.  He has also been prescribed lisinopril.  The patient on exam is severely hypertensive at 240/144, his heart sounds are normal, there is only a slight murmur, his abdomen is soft and nontender, he does have 1+ pitting edema symmetrically below the knees.  He speaks in full sentences and does not appear to be in respiratory distress and I detect no rales on exam.  His x-ray reveals severe cardiomegaly, no significant pulmonary edema  His lab work shows a significant elevation in his creatinine to 22 potassium of 5.2 and an EKG without findings of hyperkalemia.  The patient is critically ill with severe hypertensive emergency, he will need to have nitroglycerin drip, he will need to be admitted to the hospital, dialysis, the patient is in agreement with this plan.   EKG Interpretation  Date/Time:  Sunday June 24 2017 19:40:07 EST Ventricular Rate:  101 PR Interval:  114 QRS Duration: 84 QT Interval:  356 QTC Calculation: 461 R Axis:   -11 Text Interpretation:  Sinus tachycardia Nonspecific T wave abnormality Abnormal ECG since last tracing no significant change Confirmed by [name] [phone]) on 06/24/2017 8:28:12 PM      Vitals:   06/24/17 1919 06/24/17 1930 06/24/17 2140 06/24/17  2200  BP: (!) 215/140  (!) 230/141 (!) 220/137  Pulse:   (!) 102 (!) 102  Resp:   20 19  SpO2:  97% 96% 100%  Weight:  57.6 kg (127 lb)    Height:  5\' 5"  (1.651 m)       CRITICAL CARE Performed by: Johnna Acosta Total critical care time: 35 minutes Critical care time was exclusive of separately billable procedures and treating other patients. Critical care was necessary to treat or prevent imminent or life-threatening deterioration. Critical care was time spent personally by me on the following activities: development of treatment plan with patient and/or surrogate as well as nursing, discussions with consultants, evaluation of patient's response to treatment, examination of patient, obtaining history from patient or surrogate, ordering and performing treatments and interventions, ordering and review of laboratory studies, ordering and review of radiographic studies, pulse oximetry and re-evaluation of patient's condition.  Medical screening examination/treatment/procedure(s) were conducted as a shared visit with non-physician practitioner(s) and myself.  I personally evaluated the patient during the encounter.  Clinical Impression:   Final diagnoses:  Hypertensive crisis  SOB (shortness of breath)  ESRD (end stage renal disease) ([name])         [name], MD 06/25/17 [phone]

## 2017-06-24 NOTE — ED Provider Notes (Signed)
[facility] EMERGENCY DEPARTMENT Provider Note   CSN: [id number] Arrival date & time: 06/24/17  1850     History   Chief Complaint Chief Complaint  Patient presents with  . Shortness of Breath    HPI Angel [name] is a 46 y.o. male.  HPI   Angel [name] is a 46yo male with a history of ESRD, CHF, hypertension, hyperlipidemia, GERD, anemia who presents emergency department for dialysis.  Patient states that he recently moved from [place] where he was getting hemodialysis Tuesday, Thursday, Saturday.  Has a portacath in the right chest. He has not established a dialysis center in [place].  States that he has a Education officer, museum who is working on finding him nephrologist in the area.  Reports that he was seen at [facility] long ER on Wednesday and scheduled for dialysis, but left AMA due to wait time and had to pick up his son. States that he has had worsening shortness of breath for the past several days. Also reports swelling in his bilateral lower legs. Denies fever, cough, headache, visual disturbance, numbness, weakness, chest pain, abdominal pain, N/V, diarrhea. He states that he urinates two or three times a day. States that his blood pressure has been high despite taking hydralazine, clonidine, carvedilol and lisinopril.   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   Past Medical History:  Diagnosis Date  . CHF (congestive heart failure) ([place])   . ESRD on dialysis ([place])   . FSGS (focal segmental glomerulosclerosis)   . GERD (gastroesophageal reflux disease)   . HLD (hyperlipidemia)   . Hypertension   . Renal disorder   . Seizure ([place])   . Stroke [facility]cerebrum) [facility])     Patient Active Problem List   Diagnosis Date Noted  . Hypertensive urgency 06/20/2017  . Fluid overload 06/20/2017  . Hyperkalemia 06/20/2017  . GERD (gastroesophageal reflux disease) 06/20/2017  . Seizure  ([place]) 06/20/2017  . Abnormal LFTs 06/20/2017  . Anemia in ESRD (end-stage renal disease) ([place]) 06/20/2017  . HLD (hyperlipidemia)   . Stroke (cerebrum) ([place])   . ESRD on dialysis ([place])   . Acute on chronic diastolic CHF (congestive heart failure) ([place])     Past Surgical History:  Procedure Laterality Date  . ABDOMINAL AORTIC ANEURYSM REPAIR    . KIDNEY SURGERY         Home Medications    Prior to Admission medications   Medication Sig Start Date End Date Taking? Authorizing Provider  aspirin EC 81 MG tablet Take 81 mg daily by mouth.   Yes [provider]  atorvastatin (LIPITOR) 40 MG tablet Take 40 mg at bedtime by mouth.   Yes [provider]  carvedilol (COREG) 25 MG tablet Take 25 mg 2 (two) times daily with a meal by mouth.   Yes [provider]  cloNIDine (CATAPRES) 0.2 MG tablet Take 0.2 mg 2 (two) times daily by mouth.   Yes [provider]  furosemide (LASIX) 80 MG tablet Take 80 mg at bedtime by mouth.   Yes [provider]  hydrALAZINE (APRESOLINE) 50 MG tablet Take 100 mg 3 (three) times daily by mouth.   Yes [provider]  lisinopril (PRINIVIL,ZESTRIL) 40 MG tablet Take 40 mg daily by mouth.   Yes [provider]    Family History Family History  Problem Relation Age of Onset  . Hypertension Mother   .  Kidney disease Mother   . Asthma Father   . Hypertension Father   . Kidney disease Father   . Hypertension Sister   . Heart disease Sister   . Kidney disease Sister   . Heart disease Brother   . Hypertension Brother   . COPD Brother     Social History Social History   Tobacco Use  . Smoking status: Never Smoker  . Smokeless tobacco: Never Used  Substance Use Topics  . Alcohol use: No    Frequency: Never  . Drug use: No     Allergies   Patient has no known allergies.   Review of Systems Review of Systems  Constitutional: Negative for chills, fatigue and fever.  HENT: Negative  for congestion.   Respiratory: Positive for shortness of breath. Negative for cough.   Cardiovascular: Positive for leg swelling (bilateral). Negative for chest pain and palpitations.  Gastrointestinal: Negative for abdominal pain, nausea and vomiting.  Genitourinary: Negative for dysuria and frequency.  Musculoskeletal: Negative for gait problem.  Skin: Negative for color change.  Neurological: Negative for dizziness, weakness, numbness and headaches.  Psychiatric/Behavioral: Negative for agitation and confusion.     Physical Exam Updated Vital Signs BP (!) 230/141 (BP Location: Right Arm)   Pulse (!) 102   Resp 20   Ht 5\' 5"  (1.651 m)   Wt 57.6 kg (127 lb)   SpO2 96%   BMI 21.13 kg/m   Physical Exam  Constitutional: He is oriented to person, place, and time. He appears well-developed and well-nourished.  Non-toxic appearance. He does not appear ill. No distress.  Sitting comfortably at the bedside, answering questions appropriately.  HENT:  Head: Normocephalic and atraumatic.  Mouth/Throat: Oropharynx is clear and moist. No oropharyngeal exudate.  Eyes: Pupils are equal, round, and reactive to light.  Neck: Normal range of motion. Neck supple. JVD present. No tracheal deviation present.  Cardiovascular: Normal rate, regular rhythm and intact distal pulses.  No murmur heard. Pulmonary/Chest: Effort normal. No tachypnea. No respiratory distress. He has no wheezes. He has no rhonchi. He has no rales.     Abdominal: Soft. Bowel sounds are normal. He exhibits no distension. There is no tenderness. There is no guarding.  Midline well healed abdominal scar. No fluid wave.  Musculoskeletal:  Bilateral 1+ pitting edema at the ankles.   Neurological: He is alert and oriented to person, place, and time.  Skin: Skin is warm and dry. Capillary refill takes less than 2 seconds.  Psychiatric: He has a normal mood and affect. His behavior is normal.  Nursing note and vitals  reviewed.    ED Treatments / Results  Labs (all labs ordered are listed, but only abnormal results are displayed) Labs Reviewed  CBC - Abnormal; Notable for the following components:      Result Value   RBC 2.65 (*)    Hemoglobin 7.5 (*)    HCT 22.2 (*)    RDW 16.1 (*)    All other components within normal limits  BASIC METABOLIC PANEL - Abnormal; Notable for the following components:   Potassium 5.2 (*)    CO2 16 (*)    Glucose, Bld 112 (*)    BUN 203 (*)    Creatinine, Ser 22.62 (*)    GFR calc non Af Amer 2 (*)    GFR calc Af Amer 2 (*)    Anion gap 23 (*)    All other components within normal limits  I-STAT TROPONIN, ED  SAMPLE  TO BLOOD BANK    EKG  EKG Interpretation  Date/Time:  Sunday June 24 2017 19:40:07 EST Ventricular Rate:  101 PR Interval:  114 QRS Duration: 84 QT Interval:  356 QTC Calculation: 461 R Axis:   -11 Text Interpretation:  Sinus tachycardia Nonspecific T wave abnormality Abnormal ECG since last tracing no significant change Confirmed by [name] [phone]) on 06/24/2017 8:28:12 PM       Radiology Dg Chest 2 View  Result Date: 06/24/2017 CLINICAL DATA:  Shortness of breath EXAM: CHEST  2 VIEW COMPARISON:  Four days ago FINDINGS: Cardiopericardial enlargement that is stable. Aortic tortuosity. Dialysis catheter on the right with tip at the right atrium. There is no edema, consolidation, effusion, or pneumothorax. No acute osseous finding. IMPRESSION: 1. No acute finding. 2. Cardiomegaly. Electronically Signed   By: Monte Fantasia M.D.   On: 06/24/2017 20:54    Procedures Procedures (including critical care time)  Medications Ordered in ED Medications  nitroGLYCERIN 50 mg in dextrose 5 % 250 mL (0.2 mg/mL) infusion (not administered)     Initial Impression / Assessment and Plan / ED Course  I have reviewed the triage vital signs and the nursing notes.  Pertinent labs & imaging results that were available during my care of the  patient were reviewed by me and considered in my medical decision making (see chart for details).  Clinical Course as of Jun 25 2211  Nancy Fetter Jun 24, 2017  2206 Spoke with Nephrologist Dr. Jonnie Finner who states nephrology will see patient in the morning for dialysis. Would like him to have 30g kayexalate. Ordered.    [ES]    Clinical Course User Index [ES] Glyn Ade, PA-C   ESRD patient presents with worsening shortness of breath. Last hemodialysis treatment over a week ago. No respiratory distress, breathing comfortably on room air. Lungs CTA. He has 1+ pitting edema up to the ankles.  Blood pressure 215/140. CXR with stable cardiomegaly, no pulmonary edema or effusion. BMP reveals hyperkalemia (K+ 5.2), elevated creatinine (22.62), anion gap 23. CBC reveals Hgb 7.5 (stable from previous). EKG without peaked t-waves.    Patient started on nitroglycerin drip in the ED. He denies CP, SOB, headache, visual changes. Nephrology consulted who state they will see patient in the morning for hemodialysis.   Consulted IM Dr. Maudie Mercury who will admit patient for hypertensive crisis and dialysis. Discussed the patient with Dr. Sabra Heck who also saw the patient and agrees with the above plan.    Final Clinical Impressions(s) / ED Diagnoses   Final diagnoses:  Hypertensive crisis  SOB (shortness of breath)  ESRD (end stage renal disease) [facility])    ED Discharge Orders    None       Bernarda Caffey 06/25/17 1605    [name], MD 07/09/17 1131

## 2017-06-24 NOTE — ED Notes (Signed)
Delay in lab draw pt not in room. 

## 2017-06-24 NOTE — ED Triage Notes (Signed)
Reports feeling SOB for the last four days.  Moved from [place] and has not had dialysis for a week.

## 2017-06-24 NOTE — H&P (Addendum)
TRH H&P   Patient Demographics:    Angel [name], is a 46 y.o. male  MRN: [id number]   DOB - May 02, 1971  Admit Date - 06/24/2017  Outpatient Primary MD for the patient is Gwenlyn Saran, MD  Referring MD/NP/PA: Florentina Jenny for Angel [name]  Outpatient Specialists:   Patient coming from: home  Chief Complaint  Patient presents with  . Shortness of Breath      HPI:    Angel [name]  is a 46 y.o. male, w Stroke, Seizure do, ESRD on HD, Hypertension, CHF(diastolic), apparently w recent admission for hypertensive emergency , left AMA, now presents w c/o dyspnea and not having had dialysis in over 1 week.   In ED, pt was noted to be hypertensive  240/144,  Bun 203, Creatinine 22.62 K 5.2 Wbc 7.3, hgb 7.5, Plt 245  CXR IMPRESSION: 1. No acute finding. 2. Cardiomegaly.  EKG nsr 100 , nl axis, poor R progression T inversion in  v5, 6 Trop negative  Pt will be admitted for dyspnea secondary to hypertensive emergency.       Review of systems:    In addition to the HPI above, No Fever-chills, No Headache, No changes with Vision or hearing, No problems swallowing food or Liquids, No Chest pain, No Cough  No Abdominal pain, No Nausea or Vommitting, Bowel movements are regular, No Blood in stool or Urine, No dysuria, No new skin rashes or bruises, No new joints pains-aches,  No new weakness, tingling, numbness in any extremity, No recent weight gain or loss, No polyuria, polydypsia or polyphagia, No significant Mental Stressors.  A full 10 point Review of Systems was done, except as stated above, all other Review of Systems were negative.   With Past History of the following :    Past Medical History:  Diagnosis Date  . CHF (congestive heart failure) ([place])   . ESRD on dialysis ([place])   . FSGS (focal segmental glomerulosclerosis)   . GERD (gastroesophageal reflux  disease)   . HLD (hyperlipidemia)   . Hypertension   . Renal disorder   . Seizure ([place])   . Stroke (cerebrum) [facility])       Past Surgical History:  Procedure Laterality Date  . ABDOMINAL AORTIC ANEURYSM REPAIR    . KIDNEY SURGERY        Social History:     Social History   Tobacco Use  . Smoking status: Never Smoker  . Smokeless tobacco: Never Used  Substance Use Topics  . Alcohol use: No    Frequency: Never     Lives - at home  Mobility - walks by self   Family History :     Family History  Problem Relation Age of Onset  . Hypertension Mother   . Kidney disease Mother   . Asthma Father   . Hypertension Father   . Kidney disease  Father   . Hypertension Sister   . Heart disease Sister   . Kidney disease Sister   . Heart disease Brother   . Hypertension Brother   . COPD Brother       Home Medications:   Prior to Admission medications   Medication Sig Start Date End Date Taking? Authorizing Provider  aspirin EC 81 MG tablet Take 81 mg daily by mouth.   Yes [provider]  atorvastatin (LIPITOR) 40 MG tablet Take 40 mg at bedtime by mouth.   Yes [provider]  carvedilol (COREG) 25 MG tablet Take 25 mg 2 (two) times daily with a meal by mouth.   Yes [provider]  cloNIDine (CATAPRES) 0.2 MG tablet Take 0.2 mg 2 (two) times daily by mouth.   Yes [provider]  furosemide (LASIX) 80 MG tablet Take 80 mg at bedtime by mouth.   Yes [provider]  hydrALAZINE (APRESOLINE) 50 MG tablet Take 100 mg 3 (three) times daily by mouth.   Yes [provider]  lisinopril (PRINIVIL,ZESTRIL) 40 MG tablet Take 40 mg daily by mouth.   Yes [provider]     Allergies:    No Known Allergies   Physical Exam:   Vitals  Blood pressure (!) 220/137, pulse (!) 102, resp. rate 19, height '5\' 5"'$  (1.651 m), weight 57.6 kg (127 lb), SpO2 100 %.   1. General  lying in bed in NAD,   2. Normal affect and  insight, Not Suicidal or Homicidal, Awake Alert, Oriented X 3.  3. No F.N deficits, ALL C.Nerves Intact, Strength 5/5 all 4 extremities, Sensation intact all 4 extremities, Plantars down going.  4. Ears and Eyes appear Normal, Conjunctivae clear, PERRLA. Moist Oral Mucosa.  5. Supple Neck, No JVD, No cervical lymphadenopathy appriciated, No Carotid Bruits.  6. Symmetrical Chest wall movement, Good air movement bilaterally, + blbasilar crackles, no wheezing  7. Tachy s1, s2, no m/g/r   + hickmann in the right upper chest  8. Positive Bowel Sounds, Abdomen Soft, No tenderness, No organomegaly appriciated,No rebound -guarding or rigidity.  9.  No Cyanosis, Normal Skin Turgor, No Skin Rash or Bruise.  10. Good muscle tone,  joints appear normal , no effusions, Normal ROM.  11. No Palpable Lymph Nodes in Neck or Axillae     Data Review:    CBC Recent Labs  Lab 06/20/17 0036 06/24/17 1953  WBC 8.0 7.3  HGB 7.0* 7.5*  HCT 20.9* 22.2*  PLT 222 245  MCV 85.7 83.8  MCH 28.7 28.3  MCHC 33.5 33.8  RDW 16.4* 16.1*  LYMPHSABS 1.5  --   MONOABS 0.6  --   EOSABS 0.3  --   BASOSABS 0.0  --    ------------------------------------------------------------------------------------------------------------------  Chemistries  Recent Labs  Lab 06/20/17 0036 06/24/17 1953  NA 139 141  K 5.2* 5.2*  CL 99* 102  CO2 19* 16*  GLUCOSE 95 112*  BUN 139* 203*  CREATININE 18.19* 22.62*  CALCIUM 9.4 9.8  AST 177*  --   ALT 468*  --   ALKPHOS 115  --   BILITOT 1.1  --    ------------------------------------------------------------------------------------------------------------------ estimated creatinine clearance is 3.3 mL/min (A) (by C-G formula based on SCr of 22.62 mg/dL (H)). ------------------------------------------------------------------------------------------------------------------ No results for input(s): TSH, T4TOTAL, T3FREE, THYROIDAB in the last 72 hours.  Invalid  input(s): FREET3  Coagulation profile No results for input(s): INR, PROTIME in the last 168 hours. ------------------------------------------------------------------------------------------------------------------- No results for  input(s): DDIMER in the last 72 hours. -------------------------------------------------------------------------------------------------------------------  Cardiac Enzymes No results for input(s): CKMB, TROPONINI, MYOGLOBIN in the last 168 hours.  Invalid input(s): CK ------------------------------------------------------------------------------------------------------------------ No results found for: BNP   ---------------------------------------------------------------------------------------------------------------  Urinalysis No results found for: COLORURINE, APPEARANCEUR, LABSPEC, PHURINE, GLUCOSEU, HGBUR, BILIRUBINUR, KETONESUR, PROTEINUR, UROBILINOGEN, NITRITE, LEUKOCYTESUR  ----------------------------------------------------------------------------------------------------------------   Imaging Results:    Dg Chest 2 View  Result Date: 06/24/2017 CLINICAL DATA:  Shortness of breath EXAM: CHEST  2 VIEW COMPARISON:  Four days ago FINDINGS: Cardiopericardial enlargement that is stable. Aortic tortuosity. Dialysis catheter on the right with tip at the right atrium. There is no edema, consolidation, effusion, or pneumothorax. No acute osseous finding. IMPRESSION: 1. No acute finding. 2. Cardiomegaly. Electronically Signed   By: Monte Fantasia M.D.   On: 06/24/2017 20:54      Assessment & Plan:    Principal Problem:   Hypertensive emergency Active Problems:   ESRD on dialysis ([place])   Hyperkalemia   Anemia in ESRD (end-stage renal disease) ([name])    Dyspnea secondary to Hypertensive emergency Nitro gtt Cont carvedilol Cont hydralazine Cont lisinopril Cont clonidine Hydralazine '10mg'$  iv q6h prn sbp >160  Hyperkalemia calcium gluconate 1gm  iv x1 Sodium bicarb 54mq iv x1 Kayexalate 30gm po x1 Check cmp in am  ESRD on HD Nephrology consulted by ED, [place] per ED aware of patient and will arrange HD. Appreciate input.   Anemia Check cbc in am Check ferritin, iron, tibc, b12, folate, ESR  Hyperlipidemia Cont lipitor  Trop elevation Cardiology consult for AM by email  Check cpk Trop I q6h x3 Check cardiac echo  DVT Prophylaxis  SCDs  AM Labs Ordered, also please review Full Orders  Family Communication: Admission, patients condition and plan of care including tests being ordered have been discussed with the patient  who indicate understanding and agree with the plan and Code Status.  Code Status FULL CODE  Likely DC to  home  Condition GUARDED    Consults called:  Nephrology by ED  Admission status:  inpatient  Time spent in minutes : 45   JJani GravelM.D on 06/24/2017 at 10:50 PM  Between 7am to 7pm - Pager - [phone] After 7pm go to [email or web address] - password [email or web address] - Office  [phone]

## 2017-06-24 NOTE — ED Notes (Signed)
A copy of pt troponin .41 given to pt for C25.

## 2017-06-25 ENCOUNTER — Other Ambulatory Visit: Payer: Self-pay

## 2017-06-25 DIAGNOSIS — R748 Abnormal levels of other serum enzymes: Secondary | ICD-10-CM

## 2017-06-25 DIAGNOSIS — R7989 Other specified abnormal findings of blood chemistry: Secondary | ICD-10-CM

## 2017-06-25 DIAGNOSIS — R778 Other specified abnormalities of plasma proteins: Secondary | ICD-10-CM | POA: Insufficient documentation

## 2017-06-25 LAB — CBC
HCT: 20.7 % — ABNORMAL LOW (ref 39.0–52.0)
Hemoglobin: 7 g/dL — ABNORMAL LOW (ref 13.0–17.0)
MCH: 28.5 pg (ref 26.0–34.0)
MCHC: 33.8 g/dL (ref 30.0–36.0)
MCV: 84.1 fL (ref 78.0–100.0)
PLATELETS: 207 10*3/uL (ref 150–400)
RBC: 2.46 MIL/uL — AB (ref 4.22–5.81)
RDW: 16.1 % — ABNORMAL HIGH (ref 11.5–15.5)
WBC: 6.9 10*3/uL (ref 4.0–10.5)

## 2017-06-25 LAB — COMPREHENSIVE METABOLIC PANEL
ALK PHOS: 90 U/L (ref 38–126)
ALT: 147 U/L — AB (ref 17–63)
AST: 24 U/L (ref 15–41)
Albumin: 3.2 g/dL — ABNORMAL LOW (ref 3.5–5.0)
Anion gap: 22 — ABNORMAL HIGH (ref 5–15)
BUN: 209 mg/dL — ABNORMAL HIGH (ref 6–20)
CALCIUM: 9.7 mg/dL (ref 8.9–10.3)
CHLORIDE: 103 mmol/L (ref 101–111)
CO2: 18 mmol/L — ABNORMAL LOW (ref 22–32)
CREATININE: 23.78 mg/dL — AB (ref 0.61–1.24)
GFR, EST AFRICAN AMERICAN: 2 mL/min — AB (ref >60)
GFR, EST NON AFRICAN AMERICAN: 2 mL/min — AB (ref >60)
Glucose, Bld: 125 mg/dL — ABNORMAL HIGH (ref 65–99)
Potassium: 5.2 mmol/L — ABNORMAL HIGH (ref 3.5–5.1)
Sodium: 143 mmol/L (ref 135–145)
TOTAL PROTEIN: 6.3 g/dL — AB (ref 6.5–8.1)
Total Bilirubin: 0.8 mg/dL (ref 0.3–1.2)

## 2017-06-25 LAB — IRON AND TIBC
Iron: 30 ug/dL — ABNORMAL LOW (ref 45–182)
Saturation Ratios: 12 % — ABNORMAL LOW (ref 17.9–39.5)
TIBC: 241 ug/dL — AB (ref 250–450)
UIBC: 211 ug/dL

## 2017-06-25 LAB — CK TOTAL AND CKMB (NOT AT ARMC)
CK, MB: 11.6 ng/mL — ABNORMAL HIGH (ref 0.5–5.0)
RELATIVE INDEX: 1.9 (ref 0.0–2.5)
Total CK: 603 U/L — ABNORMAL HIGH (ref 49–397)

## 2017-06-25 LAB — TROPONIN I
TROPONIN I: 0.47 ng/mL — AB (ref <0.03)
TROPONIN I: 0.41 ng/mL — AB (ref <0.03)
TROPONIN I: 4.35 ng/mL — AB (ref <0.03)

## 2017-06-25 LAB — FERRITIN: Ferritin: 1472 ng/mL — ABNORMAL HIGH (ref 24–336)

## 2017-06-25 LAB — MRSA PCR SCREENING: MRSA by PCR: NEGATIVE

## 2017-06-25 LAB — SEDIMENTATION RATE: SED RATE: 35 mm/h — AB (ref 0–16)

## 2017-06-25 LAB — VITAMIN B12: VITAMIN B 12: 3701 pg/mL — AB (ref 180–914)

## 2017-06-25 LAB — TSH: TSH: 2.543 u[IU]/mL (ref 0.350–4.500)

## 2017-06-25 MED ORDER — HEPARIN SODIUM (PORCINE) 5000 UNIT/ML IJ SOLN
5000.0000 [IU] | Freq: Three times a day (TID) | INTRAMUSCULAR | Status: DC
  Administered 2017-06-25: 5000 [IU] via SUBCUTANEOUS
  Filled 2017-06-25: qty 1

## 2017-06-25 MED ORDER — CLONIDINE HCL 0.2 MG PO TABS
0.2000 mg | ORAL_TABLET | Freq: Three times a day (TID) | ORAL | Status: DC
Start: 2017-06-25 — End: 2017-06-25
  Administered 2017-06-25: 0.2 mg via ORAL
  Filled 2017-06-25 (×2): qty 1

## 2017-06-25 MED ORDER — CARVEDILOL 25 MG PO TABS
25.0000 mg | ORAL_TABLET | Freq: Two times a day (BID) | ORAL | Status: DC
  Administered 2017-06-25 – 2017-06-27 (×5): 25 mg via ORAL
  Filled 2017-06-25: qty 2
  Filled 2017-06-25 (×4): qty 1

## 2017-06-25 MED ORDER — ASPIRIN EC 81 MG PO TBEC
81.0000 mg | DELAYED_RELEASE_TABLET | Freq: Every day | ORAL | Status: DC
  Administered 2017-06-25 – 2017-06-27 (×3): 81 mg via ORAL
  Filled 2017-06-25 (×3): qty 1

## 2017-06-25 MED ORDER — CLONIDINE HCL 0.2 MG PO TABS
0.2000 mg | ORAL_TABLET | Freq: Three times a day (TID) | ORAL | Status: DC
  Administered 2017-06-25 – 2017-06-27 (×5): 0.2 mg via ORAL
  Filled 2017-06-25 (×5): qty 1

## 2017-06-25 MED ORDER — FUROSEMIDE 20 MG PO TABS: 80.0000 mg | ORAL_TABLET | Freq: Every day | ORAL | Status: DC

## 2017-06-25 MED ORDER — DARBEPOETIN ALFA 200 MCG/0.4ML IJ SOSY
200.0000 ug | PREFILLED_SYRINGE | INTRAMUSCULAR | Status: DC
  Administered 2017-06-25: 200 ug via INTRAVENOUS

## 2017-06-25 MED ORDER — HEPARIN BOLUS VIA INFUSION
2000.0000 [IU] | Freq: Once | INTRAVENOUS | Status: AC
Start: 2017-06-25 — End: 2017-06-25
  Administered 2017-06-25: 2000 [IU] via INTRAVENOUS
  Filled 2017-06-25: qty 2000

## 2017-06-25 MED ORDER — FUROSEMIDE 10 MG/ML IJ SOLN
80.0000 mg | Freq: Two times a day (BID) | INTRAMUSCULAR | Status: DC
  Administered 2017-06-25 – 2017-06-26 (×3): 80 mg via INTRAVENOUS
  Filled 2017-06-25 (×3): qty 8

## 2017-06-25 MED ORDER — SODIUM CHLORIDE 0.9 % IV SOLN: 250.0000 mL | INTRAVENOUS | Status: DC | PRN

## 2017-06-25 MED ORDER — DARBEPOETIN ALFA 200 MCG/0.4ML IJ SOSY
PREFILLED_SYRINGE | INTRAMUSCULAR | Status: AC
  Administered 2017-06-25: 200 ug via INTRAVENOUS
  Filled 2017-06-25: qty 0.4

## 2017-06-25 MED ORDER — CALCITRIOL 0.25 MCG PO CAPS
0.7500 ug | ORAL_CAPSULE | ORAL | Status: DC
  Administered 2017-06-25 – 2017-06-27 (×2): 0.75 ug via ORAL
  Filled 2017-06-25: qty 1
  Filled 2017-06-25: qty 3

## 2017-06-25 MED ORDER — PROMETHAZINE HCL 25 MG/ML IJ SOLN
12.5000 mg | Freq: Four times a day (QID) | INTRAMUSCULAR | Status: DC | PRN
  Administered 2017-06-25: 25 mg via INTRAVENOUS
  Filled 2017-06-25: qty 1

## 2017-06-25 MED ORDER — LABETALOL HCL 5 MG/ML IV SOLN
5.0000 mg | INTRAVENOUS | Status: DC | PRN
  Administered 2017-06-26 – 2017-06-27 (×3): 5 mg via INTRAVENOUS
  Filled 2017-06-25 (×3): qty 4

## 2017-06-25 MED ORDER — TRAMADOL HCL 50 MG PO TABS
ORAL_TABLET | ORAL | Status: AC
  Administered 2017-06-25: 50 mg via ORAL
  Filled 2017-06-25: qty 1

## 2017-06-25 MED ORDER — HEPARIN (PORCINE) IN NACL 100-0.45 UNIT/ML-% IJ SOLN
1550.0000 [IU]/h | INTRAMUSCULAR | Status: DC
  Administered 2017-06-25: 650 [IU]/h via INTRAVENOUS
  Administered 2017-06-26: 1150 [IU]/h via INTRAVENOUS
  Filled 2017-06-25 (×3): qty 250

## 2017-06-25 MED ORDER — SODIUM CHLORIDE 0.9% FLUSH: 3.0000 mL | INTRAVENOUS | Status: DC | PRN

## 2017-06-25 MED ORDER — SODIUM CHLORIDE 0.9% FLUSH: 3.0000 mL | Freq: Two times a day (BID) | INTRAVENOUS | Status: DC

## 2017-06-25 MED ORDER — CLONIDINE HCL 0.2 MG PO TABS: 0.2000 mg | ORAL_TABLET | Freq: Two times a day (BID) | ORAL | Status: DC

## 2017-06-25 MED ORDER — ACETAMINOPHEN 325 MG PO TABS: 650.0000 mg | ORAL_TABLET | Freq: Four times a day (QID) | ORAL | Status: DC | PRN

## 2017-06-25 MED ORDER — CLONIDINE HCL 0.2 MG PO TABS
0.2000 mg | ORAL_TABLET | Freq: Once | ORAL | Status: AC
  Administered 2017-06-25: 0.2 mg via ORAL
  Filled 2017-06-25 (×2): qty 1

## 2017-06-25 MED ORDER — ATORVASTATIN CALCIUM 40 MG PO TABS
40.0000 mg | ORAL_TABLET | Freq: Every day | ORAL | Status: DC
  Administered 2017-06-25 – 2017-06-26 (×2): 40 mg via ORAL
  Filled 2017-06-25 (×2): qty 1

## 2017-06-25 MED ORDER — HYDROMORPHONE HCL 1 MG/ML IJ SOLN
1.0000 mg | Freq: Once | INTRAMUSCULAR | Status: AC
  Administered 2017-06-25: 1 mg via INTRAVENOUS

## 2017-06-25 MED ORDER — TRAMADOL HCL 50 MG PO TABS
50.0000 mg | ORAL_TABLET | Freq: Four times a day (QID) | ORAL | Status: DC | PRN
  Administered 2017-06-25: 50 mg via ORAL

## 2017-06-25 MED ORDER — HYDROMORPHONE HCL 1 MG/ML IJ SOLN
INTRAMUSCULAR | Status: AC
  Filled 2017-06-25: qty 1

## 2017-06-25 MED ORDER — SODIUM CHLORIDE 0.9 % IV SOLN
125.0000 mg | INTRAVENOUS | Status: DC
  Administered 2017-06-25: 125 mg via INTRAVENOUS
  Filled 2017-06-25: qty 10

## 2017-06-25 MED ORDER — HYDROXYZINE HCL 25 MG PO TABS: 25.0000 mg | ORAL_TABLET | ORAL | Status: DC | PRN

## 2017-06-25 MED ORDER — LISINOPRIL 20 MG PO TABS
40.0000 mg | ORAL_TABLET | Freq: Every day | ORAL | Status: DC
  Administered 2017-06-25 – 2017-06-27 (×3): 40 mg via ORAL
  Filled 2017-06-25 (×3): qty 2

## 2017-06-25 NOTE — Progress Notes (Signed)
ANTICOAGULATION CONSULT NOTE - Initial Consult  Pharmacy Consult:  Heparin Indication: chest pain/ACS  No Known Allergies  Patient Measurements: Height: 5\' 5"  (165.1 cm) Weight: 127 lb (57.6 kg) IBW/kg (Calculated) : 61.5 Heparin Dosing Weight: 57 kg  Vital Signs: Temp: 98.1 F (36.7 C) (11/19 1337) Temp Source: Oral (11/19 1337) BP: 220/134 (11/19 1600) Pulse Rate: 104 (11/19 1600)  Labs: Recent Labs    06/24/17 1953 06/25/17 0127 06/25/17 0321 06/25/17 0924 06/25/17 1413  HGB 7.5*  --  7.0*  --   --   HCT 22.2*  --  20.7*  --   --   PLT 245  --  207  --   --   CREATININE 22.62*  --  23.78*  --   --   CKTOTAL  --  603*  --   --   --   CKMB  --  11.6*  --   --   --   TROPONINI  --   --  0.47* 0.41* 4.35*    Estimated Creatinine Clearance: 3.2 mL/min (A) (by C-G formula based on SCr of 23.78 mg/dL (H)).   Medical History: Past Medical History:  Diagnosis Date  . CHF (congestive heart failure) ([place])   . ESRD on dialysis ([place])   . FSGS (focal segmental glomerulosclerosis)   . GERD (gastroesophageal reflux disease)   . HLD (hyperlipidemia)   . Hypertension   . Renal disorder   . Seizure ([place])   . Stroke (cerebrum) [facility])       Assessment: 87 YOM recently admitted with hypertensive emergency and then left AMA.  Patient returned with SOB.  Now with elevated troponin and Pharmacy consulted to initiate IV heparin for ACS.  Noted patient received heparin SQ this AM around 0530.  He has ESRD and received HD today.  Patient has low hemoglobin and hematocrit.   Goal of Therapy:  Heparin level 0.3-0.7 units/ml Monitor platelets by anticoagulation protocol: Yes    Plan:  D/C heparin SQ  Heparin 2000 units IV bolus x 1, then Heparin gtt at 650 units/hr Check 8 hr heparin level Daily heparin level and CBC Monitor closely for s/sx of bleeding   Lyndle Pang D. Mina Marble, PharmD, BCPS Pager:  [phone] 06/25/2017, 4:23 PM

## 2017-06-25 NOTE — ED Notes (Signed)
Dr Thereasa Solo at bedside to re-eval pt

## 2017-06-25 NOTE — Progress Notes (Signed)
Pt hypertensive SBPs in the 200's DBP >130; Dr. Giles Maes notified and she ordered Clonidine 0.2mg  once; Lab called with a critical lab troponin =4.35. Dr. Thereasa Solo the primary made aware and he referred me to cardiology; paged cardiology and were made aware. Cardiology stated she is going to put some orders.

## 2017-06-25 NOTE — Consult Note (Signed)
Cardiology Consult    Patient ID: Angel [name] MRN: [id number], DOB/AGE: 09/22/1970   Admit date: 06/24/2017 Date of Consult: 06/25/2017  Primary Physician: Angel Saran, MD Primary Cardiologist: Dr. Lavone Neri [facility] Requesting Provider: Dr. Maudie Mercury  Reason for Consult: chest pain  Patient Profile    Angel [name] is a 46 y.o. male with PMH of ESRD on HD, HTN, dCHF, h/o CVA and seizure, AAA repair, who is being seen today for the evaluation of chest pain. He presented to ED with dyspnea and was found to be in hypertensive emergency due to missing HD.   Past Medical History   Past Medical History:  Diagnosis Date  . CHF (congestive heart failure) ([place])   . ESRD on dialysis ([place])   . FSGS (focal segmental glomerulosclerosis)   . GERD (gastroesophageal reflux disease)   . HLD (hyperlipidemia)   . Hypertension   . Renal disorder   . Seizure ([place])   . Stroke (cerebrum) [facility])     Past Surgical History:  Procedure Laterality Date  . ABDOMINAL AORTIC ANEURYSM REPAIR    . KIDNEY SURGERY      Allergies  No Known Allergies  History of Present Illness    Angel [name] presented to [facility] with dyspnea. He has a history of ESRD on HD via permacath (previous PD) and had apparently recently moved from [place] area to [place] area and not established with HD center; patient was admitted on 11/14 for hypertensive urgency due to missing HD after his move but left prior to getting HD done; on 11/18 he presented again to the ED with worsening dyspnea and again was found to be in hypertensive urgency. His troponis was mildly elevated to 0.4, with no acute EKG changes. He was found to be uremic to 209*, and has stable hyperK to 5.2. Nephrology was consulted for HD and he is still waiting for this.   On evaluation, patient is sleeping but does wake up to voice; he had just received phenergan for nausea and dry heaving per nursing. Patient endorses 4 days of central chest pain which he  currently also endorses, as well as shortness of breath. He is unable to tell me in more detail what has been occurring prior to his presentation to the ED.  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 He had an uneventful aneurysm repair in July 2018 except for difficult to control HTN.   Inpatient Medications    . aspirin EC  81 mg Oral Daily  . atorvastatin  40 mg Oral QHS  . carvedilol  25 mg Oral BID WC  . cloNIDine  0.2 mg Oral TID  . furosemide  80 mg Intravenous Q12H  . heparin  5,000 Units Subcutaneous Q8H  . hydrALAZINE  100 mg Oral TID  . HYDROmorphone      . lisinopril  40 mg Oral Daily   Outpatient Medications    Prior to Admission medications   Medication Sig Start Date End Date Taking? Authorizing Provider  aspirin EC 81 MG tablet Take 81 mg daily by mouth.   Yes [provider]  atorvastatin (LIPITOR) 40 MG tablet Take 40 mg at bedtime  by mouth.   Yes [provider]  carvedilol (COREG) 25 MG tablet Take 25 mg 2 (two) times daily with a meal by mouth.   Yes [provider]  cloNIDine (CATAPRES) 0.2 MG tablet Take 0.2 mg 2 (two) times daily by mouth.   Yes [provider]  furosemide (LASIX) 80 MG tablet Take 80 mg at bedtime by mouth.   Yes [provider]  hydrALAZINE (APRESOLINE) 50 MG tablet Take 100 mg 3 (three) times daily by mouth.   Yes [provider]  lisinopril (PRINIVIL,ZESTRIL) 40 MG tablet Take 40 mg daily by mouth.   Yes [provider]    Family History    Family History  Problem Relation Age of Onset  . Hypertension Mother   . Kidney disease Mother   . Asthma  Father   . Hypertension Father   . Kidney disease Father   . Hypertension Sister   . Heart disease Sister   . Kidney disease Sister   . Heart disease Brother   . Hypertension Brother   . COPD Brother    Social History    Social History   Socioeconomic History  . Marital status: Single    Spouse name: Not on file  . Number of children: Not on file  . Years of education: Not on file  . Highest education level: Not on file  Social Needs  . Financial resource strain: Not on file  . Food insecurity - worry: Not on file  . Food insecurity - inability: Not on file  . Transportation needs - medical: Not on file  . Transportation needs - non-medical: Not on file  Occupational History  . Not on file  Tobacco Use  . Smoking status: Never Smoker  . Smokeless tobacco: Never Used  Substance and Sexual Activity  . Alcohol use: No    Frequency: Never  . Drug use: No  . Sexual activity: Not on file  Other Topics Concern  . Not on file  Social History Narrative  . Not on file     Review of Systems    All other systems reviewed and are otherwise negative except as noted above. Patient unable to participate in full ROS review.  Physical Exam    Blood pressure (!) 224/140, pulse 95, resp. rate 15, height 5\' 5"  (1.651 m), weight 127 lb (57.6 kg), SpO2 94 %.  General: sleeping but will wake to answer 1-2 words at a time Psych: Normal affect. Neuro: Alert and oriented X 3. Moves all extremities spontaneously. HEENT: Normal, moist mucous membranes  Neck: JVD to ear, no carotid bruits. Lungs:  Resp regular and unlabored, CTA on anterior and post lung fields; no increased work of breathing Heart: RRR, +S4, no murmurs, rubs or gallops appreciated. Abdomen: Soft, epigastric tenderness; +BSs.  Extremities: 1+ pitting edema in bil LEs. DP/PT 2+ and equal bilaterally.  Labs    Troponin [facility] of Care Test) Recent Labs    06/24/17 2252  TROPIPOC 0.41*   Recent Labs     06/25/17 0127 06/25/17 0321  CKTOTAL 603*  --   CKMB 11.6*  --   TROPONINI  --  0.47*   Lab Results  Component Value Date   WBC 6.9 06/25/2017   HGB 7.0 (L) 06/25/2017   HCT 20.7 (L) 06/25/2017   MCV 84.1 06/25/2017   PLT 207 06/25/2017    Recent Labs  Lab 06/25/17 0321  NA 143  K 5.2*  CL 103  CO2  18*  BUN 209*  CREATININE 23.78*  CALCIUM 9.7  PROT 6.3*  BILITOT 0.8  ALKPHOS 90  ALT 147*  AST 24  GLUCOSE 125*   No results found for: CHOL, HDL, LDLCALC, TRIG No results found for: DDIMER   Radiology Studies    Dg Chest 2 View  Result Date: 06/24/2017 CLINICAL DATA:  Shortness of breath EXAM: CHEST  2 VIEW COMPARISON:  Four days ago FINDINGS: Cardiopericardial enlargement that is stable. Aortic tortuosity. Dialysis catheter on the right with tip at the right atrium. There is no edema, consolidation, effusion, or pneumothorax. No acute osseous finding. IMPRESSION: 1. No acute finding. 2. Cardiomegaly. Electronically Signed   By: Monte Fantasia M.D.   On: 06/24/2017 20:54   Dg Chest 2 View  Result Date: 06/20/2017 CLINICAL DATA:  Acute onset of shortness of breath. EXAM: CHEST  2 VIEW COMPARISON:  None. FINDINGS: The lungs are well-aerated and clear. There is no evidence of focal opacification, pleural effusion or pneumothorax. The heart is mildly enlarged. A right-sided dual-lumen catheter is noted ending at the right atrium. No acute osseous abnormalities are seen. IMPRESSION: Mild cardiomegaly.  Lungs remain grossly clear. Electronically Signed   By: Garald Balding M.D.   On: 06/20/2017 01:43    ECG & Cardiac Imaging    EKG 06/24/17, personally reviewed - SR, TWI in I, avL, V6 (consistent with prior)  06/14/2016 Nondiagnostic exercise stress due to not achieving target HR.  06/14/2016 Echo: -The left ventricular size is normal. -There is moderate concentric left ventricular hypertrophy. -Left ventricular systolic function is low normal. -LV ejection fraction =  50-55%. -Left ventricular filling pattern is impaired. -The right ventricle is normal in size and function. -There is mild tricuspid regurgitation. -No significant stenosis seen -No pulmonary hypertension. -Estimated right atrial pressure is 5 mmHg.. -There is no pericardial effusion. -There is no comparison study available.  Assessment & Plan    Troponemia from demand ischemia: Mild troponin elevation to 0.4 x2 in setting of hypertensive urgency from missing HD; EKG with no ischemic changes. CXR with cardiomegaly and cephalization but no consolidation or pulm edema. He has significant cardiomegaly that is stable from previous CXR. --f/u echo --we will follow along  Hypertensive urgency in setting of missing HD: Refusing nitro gtt. Home meds of hydralazine, clonidine, carvedilol, lasix, and lisinopril. Nephro on board for HD.   Uremia: BUN 209 - symptomatic. To HD today.  Anemia in setting of ESRD: Hgb 7.0 today, goal to transfuse if <7.0.  Signed, Alphonzo Grieve , MD 06/25/2017, 10:46 AM   I have personally seen and examined this patient with Dr. Jari Favre. I agree with the assessment and plan as outlined above. He has been non-compliant with HD. Now admitted with hypertensive urgency and diffuse body pains. His troponin is mildly elevated which is not surprising given his elevated BP and ESRD. EKG without evidence of acute MI.  My exam shows:  General: Well developed, well nourished, NAD, somnolent HEENT: OP clear, mucus membranes moist  SKIN: warm, dry. No rashes. Neuro: No focal deficits  Musculoskeletal: Muscle strength 5/5 all ext  Psychiatric: Mood and affect normal  Neck: No JVD, no carotid bruits, no thyromegaly, no lymphadenopathy.  Lungs:Clear bilaterally, no wheezes, rhonci, crackles Cardiovascular: Regular rate and rhythm. No murmurs, gallops or rubs. Abdomen:Soft. Bowel sounds present. Non-tender.  Extremities: No lower extremity edema. Pulses are 2 + in the  bilateral DP/PT. Labs reviewed. EKG reviewed by me and shows sinus with NSTWI.  Plan:  His elevated troponin is likely due to demand ischemia in setting of HTN urgency and ESRD. His chest pain is non-specific. EKG without acute changes. Will arrange echo to assess LVEF and wall motion.  I doubt that an ischemic evaluation will be necessary.   Lauree Chandler 06/25/2017 12:48 PM

## 2017-06-25 NOTE — ED Notes (Signed)
Pt ambulatory to bathroom at this time with steady gait and no SOB

## 2017-06-25 NOTE — Procedures (Signed)
I have personally attended this patient's dialysis session.   UF goal 1.5 0.2 clonidine for BP 236/155  Jefferson Maes, MD [facility] [phone] Pager 06/25/2017, 2:42 PM

## 2017-06-25 NOTE — ED Notes (Signed)
Pt refuses nitro gtt, wants it stopped. I discontinued the drip and notified dr Thereasa Solo.

## 2017-06-25 NOTE — ED Notes (Signed)
IP RN not available to take report at this time, will call this RN back

## 2017-06-25 NOTE — ED Notes (Signed)
Pt moaning in pain, reports chest and head pain. Paging admitting for dialysis orders and pain control.

## 2017-06-25 NOTE — ED Notes (Signed)
Nephrology here to see pt

## 2017-06-25 NOTE — Progress Notes (Signed)
[facility] TEAM 1 - Stepdown/ICU TEAM  Angel [name]  [id number]:[id number] DOB: [birth year] DOA: 06/24/2017 PCP: Gwenlyn Saran, MD    Brief Narrative:  46 y.o. male w/ a Seizure do, ESRD on HD T/Th/Sat, HTN, AAA repair, HLD, CHF(diastolic), and a recent admission for hypertensive emergency from which he left AMA who returned to the ED c/o dyspnea after not having had dialysis for over a week.   In the ED he was noted to be hypertensive at 240/144 w/ Bun 203, Creatinine 22.62, and K 5.2.   Significant Events: 11/18 - admit   Subjective: Pt is on his knees at the side of the bed calling out in a high pitched scream repeatedly.  He tells me he is "hurting all over" and describes the pain as a "9 out of 10."  He is unable to localize his sx any further, telling me "it just hurts everywhere.  Hx is hard to obtain as he continues to cry out th/o my attempts to interview him.  He is in no apparent acute resp distress.   He is alert.  He tells me "I just need something strong to knock me out.  Give me something strong."  Assessment & Plan:  Dyspnea Due to pulmonary edema due to uncontrolled HTN and noncompliance w/ HD   ESRD on HD - noncompliance  Nephrology has been contacted and will make arrangements for HD while the pt is hospitalized   Hypertensive urgency Due to combination of noncompliance w/ meds and HD - cont med tx but HD will likely be the most effective in lowering his BP   Anemia of CKD Transfuse prn Hgb < 7.0 - will need epo and Fe load w/ HD  HLD  GERD  Seizure d/o   DVT prophylaxis: SQ heparin Code Status: FULL CODE Family Communication: no family present at time of exam  Disposition Plan: transfer to tele bed   Consultants:  Nephrology   Antimicrobials:  none  Objective: Blood pressure (!) 164/82, pulse 95, resp. rate 15, height 5\' 5"  (1.651 m), weight 57.6 kg (127 lb), SpO2 94 %.  Intake/Output Summary (Last 24 hours) at 06/25/2017 0910 Last data filed  at 06/25/2017 0036 Gross per 24 hour  Intake 110 ml  Output -  Net 110 ml   Filed Weights   06/24/17 1930  Weight: 57.6 kg (127 lb)    Examination: General: No acute respiratory distress evident  Lungs: faint diffuse crackles - no wheezing  Cardiovascular: Regular rate and rhythm without murmur  Extremities: trace B LE edema   CBC: Recent Labs  Lab 06/20/17 0036 06/24/17 1953 06/25/17 0321  WBC 8.0 7.3 6.9  NEUTROABS 5.6  --   --   HGB 7.0* 7.5* 7.0*  HCT 20.9* 22.2* 20.7*  MCV 85.7 83.8 84.1  PLT 222 245 283   Basic Metabolic Panel: Recent Labs  Lab 06/20/17 0036 06/24/17 1953 06/25/17 0321  NA 139 141 143  K 5.2* 5.2* 5.2*  CL 99* 102 103  CO2 19* 16* 18*  GLUCOSE 95 112* 125*  BUN 139* 203* 209*  CREATININE 18.19* 22.62* 23.78*  CALCIUM 9.4 9.8 9.7   GFR: Estimated Creatinine Clearance: 3.2 mL/min (A) (by C-G formula based on SCr of 23.78 mg/dL (H)).  Liver Function Tests: Recent Labs  Lab 06/20/17 0036 06/25/17 0321  AST 177* 24  ALT 468* 147*  ALKPHOS 115 90  BILITOT 1.1 0.8  PROT 6.6 6.3*  ALBUMIN 3.2* 3.2*  Cardiac Enzymes: Recent Labs  Lab 06/25/17 0127 06/25/17 0321  CKTOTAL 603*  --   CKMB 11.6*  --   TROPONINI  --  0.47*      Scheduled Meds: . aspirin EC  81 mg Oral Daily  . atorvastatin  40 mg Oral QHS  . carvedilol  25 mg Oral BID WC  . cloNIDine  0.2 mg Oral BID  . furosemide  80 mg Oral QHS  . heparin  5,000 Units Subcutaneous Q8H  . hydrALAZINE  100 mg Oral TID  . HYDROmorphone      . lisinopril  40 mg Oral Daily  . sodium chloride flush  3 mL Intravenous Q12H     LOS: 1 day   [name], MD [facility] Office  [phone] Pager - Text Page per Amion as per below:  On-Call/Text Page:      [name]      password [email or web address]  If 7PM-7AM, please contact night-coverage [email or web address] Password [email or web address] 06/25/2017, 9:10 AM

## 2017-06-25 NOTE — Plan of Care (Signed)
Continue current care plan 

## 2017-06-25 NOTE — Progress Notes (Signed)
   Called with follow up Troponin 4.3. No chest pain. Discussed with Dr. Angelena Form. Will add IV heparin for now and continue to trend enzymes. Echo has been ordered.   Barnet Pall, NP-C 06/25/2017, 3:51 PM Pager: [phone]

## 2017-06-25 NOTE — Consult Note (Signed)
[facility] Consultation Note Requesting Physician:  Dr. Thereasa Solo Primary Nephrologist: [facility] Reason for Consult:  ESRD in need of HD  HPI: The patient is a 45 y.o. year-old AAM, ON HD since 08/2016, most recently at  [facility] on [address] (per their staffnon-compliant) who moved to [facility] with no HD arrangements made. Was admitted 11/14 with severe HTN, signed out AMA before renal could see. Has not had any HD since 11/8. Clinic there has discharged him. Very little hx obtainable from him as he just had dose of phenergan. Prior to that was nauseous, vomiting, stated hurting all over, and asking for dilaudid. Has only [facility] for access as his clinic says he refused AVF/AVG.   Date/Time Value  06/25/2017 03:21 AM 23.78 (H)  06/24/2017 07:53 PM 22.62 (H)  06/20/2017 12:36 AM 18.19 (H)    Past Medical History:  Diagnosis Date  . CHF (congestive heart failure) ([place])   . ESRD on dialysis ([place])   . FSGS (focal segmental glomerulosclerosis)   . GERD (gastroesophageal reflux disease)   . HLD (hyperlipidemia)   . Hypertension   . Renal disorder   . Seizure ([place])   . Stroke (cerebrum) [facility])     Past Surgical History:  Procedure Laterality Date  . ABDOMINAL AORTIC ANEURYSM REPAIR    . KIDNEY SURGERY      Family History  Problem Relation Age of Onset  . Hypertension Mother   . Kidney disease Mother   . Asthma Father   . Hypertension Father   . Kidney disease Father   . Hypertension Sister   . Heart disease Sister   . Kidney disease Sister   . Heart disease Brother   . Hypertension Brother   . COPD Brother    Social History:  reports that  has never smoked. he has never used smokeless tobacco. He reports that he does not drink alcohol or use drugs.  Allergies: No Known Allergies  Home medications: Prior to Admission medications   Medication Sig Start Date End Date Taking? Authorizing Provider  aspirin EC 81 MG tablet Take 81 mg daily by mouth.   Yes  [provider]  atorvastatin (LIPITOR) 40 MG tablet Take 40 mg at bedtime by mouth.   Yes [provider]  carvedilol (COREG) 25 MG tablet Take 25 mg 2 (two) times daily with a meal by mouth.   Yes [provider]  cloNIDine (CATAPRES) 0.2 MG tablet Take 0.2 mg 2 (two) times daily by mouth.   Yes [provider]  furosemide (LASIX) 80 MG tablet Take 80 mg at bedtime by mouth.   Yes [provider]  hydrALAZINE (APRESOLINE) 50 MG tablet Take 100 mg 3 (three) times daily by mouth.   Yes [provider]  lisinopril (PRINIVIL,ZESTRIL) 40 MG tablet Take 40 mg daily by mouth.   Yes [provider]    Inpatient medications: . aspirin EC  81 mg Oral Daily  . atorvastatin  40 mg Oral QHS  . carvedilol  25 mg Oral BID WC  . cloNIDine  0.2 mg Oral TID  . furosemide  80 mg Intravenous Q12H  . heparin  5,000 Units Subcutaneous Q8H  . hydrALAZINE  100 mg Oral TID  . HYDROmorphone      . lisinopril  40 mg Oral Daily    Review of Systems Unobtainable as pt very lethargic post phenergan  Physical Exam:  Blood pressure (!) 237/144, pulse 100, resp. rate 17, height 5\' 5"  (  1.651 m), weight 57.6 kg (127 lb), SpO2 97 %.  Gen: Lethargic post phenergan Lines/tubes: R TDC Skin: no rash, cyanosis Neck: no JVD, no bruits or LAN Chest: Clear Heart: Regular S1S2 No S3, + S4 Abdomen: soft, not tender Ext: No sig edema Neuro: Cannot fully assess Dialysis Access: [facility] R IJ site not tender   Recent Labs  Lab 06/20/17 0036 06/24/17 1953 06/25/17 0321  NA 139 141 143  K 5.2* 5.2* 5.2*  CL 99* 102 103  CO2 19* 16* 18*  GLUCOSE 95 112* 125*  BUN 139* 203* 209*  CREATININE 18.19* 22.62* 23.78*  CALCIUM 9.4 9.8 9.7    Recent Labs  Lab 06/20/17 0036 06/25/17 0321  AST 177* 24  ALT 468* 147*  ALKPHOS 115 90  BILITOT 1.1 0.8  PROT 6.6 6.3*  ALBUMIN 3.2* 3.2*    Recent Labs  Lab 06/20/17 0036 06/24/17 1953 06/25/17 0321  WBC  8.0 7.3 6.9  NEUTROABS 5.6  --   --   HGB 7.0* 7.5* 7.0*  HCT 20.9* 22.2* 20.7*  MCV 85.7 83.8 84.1  PLT 222 245 207    Recent Labs  Lab 06/25/17 0127 06/25/17 0321 06/25/17 0924  CKTOTAL 603*  --   --   CKMB 11.6*  --   --   TROPONINI  --  0.47* 0.41*   Iron/TIBC/Ferritin/ %Sat    Component Value Date/Time   IRON 30 (L) 06/25/2017 0127   TIBC 241 (L) 06/25/2017 0127   FERRITIN 1,472 (H) 06/25/2017 0127   IRONPCTSAT 12 (L) 06/25/2017 0127    Xrays/Other Studies: Dg Chest 2 View  Result Date: 06/24/2017 CLINICAL DATA:  Shortness of breath EXAM: CHEST  2 VIEW COMPARISON:  Four days ago FINDINGS: Cardiopericardial enlargement that is stable. Aortic tortuosity. Dialysis catheter on the right with tip at the right atrium. There is no edema, consolidation, effusion, or pneumothorax. No acute osseous finding. IMPRESSION: 1. No acute finding. 2. Cardiomegaly. Electronically Signed   By: Monte Fantasia M.D.   On: 06/24/2017 20:54   Dialysis prescription: TTS F180, 3.5 hours, 400/800, EDW 56.5 kg 3251 bath Heparin 2000 bolus Calcitriol 0.75 Aranesp 200/week Hb 11/8 was 7 10/18 Ca 9.5, phos 4.8 04/2017 PTH 684 Last HD 06/14/17  Background: 46 y.o. year-old AAM, ON HD since 08/2016, most recently at  [facility] on [address] (per their staffnon-compliant) who moved to [place] with no HD arrangements made. Was admitted 11/14 with severe HTN, signed out AMA before renal could see. Has not had any HD since 11/8. Clinic there has discharged him. Has only [facility] for access as his clinic says he refused AVF/AVG.   Assessment/Recommendations  1. ESRD - on dialysis since 08/2016 in [place]. Non-compliant. No HD since 06/14/17. BUN >200. Will have to proceed as if dialysis naive to avoid disequilibrium  - short daily and progress back to usual time (and in fact with [facility] will need to be 4 hour pt ultimately). 2. Dialysis access - refused AVF/AVG in past. In order to be accepted at [facility]  clinic will need one or the other before he can be placed. Will start CLIP but that will be requirement 3. HTN - severe. Refused nitro drip. Oral meds per primary svce. 4. Anemia - resume Aranesp, start iron with HD 5. Secondary HPT - check phos. Resume calcitriol with HD 6. Medical non-compliance  Hopefully once less uremic, will be able to discuss issues of his HD and what will be required of him  but right now unable to engage.   Victoriano Maes,  MD [facility] [phone] pager 06/25/2017, 11:19 AM

## 2017-06-25 NOTE — ED Notes (Signed)
Report called to Lynett Grimes, Therapist, sports. Pt will be transported there from hemodialysis

## 2017-06-26 ENCOUNTER — Inpatient Hospital Stay (HOSPITAL_COMMUNITY): Payer: Medicare Other

## 2017-06-26 DIAGNOSIS — I10 Essential (primary) hypertension: Secondary | ICD-10-CM

## 2017-06-26 DIAGNOSIS — I214 Non-ST elevation (NSTEMI) myocardial infarction: Secondary | ICD-10-CM

## 2017-06-26 DIAGNOSIS — Z992 Dependence on renal dialysis: Secondary | ICD-10-CM

## 2017-06-26 DIAGNOSIS — N186 End stage renal disease: Secondary | ICD-10-CM

## 2017-06-26 DIAGNOSIS — I361 Nonrheumatic tricuspid (valve) insufficiency: Secondary | ICD-10-CM

## 2017-06-26 LAB — RENAL FUNCTION PANEL
ALBUMIN: 2.9 g/dL — AB (ref 3.5–5.0)
ANION GAP: 17 — AB (ref 5–15)
BUN: 132 mg/dL — AB (ref 6–20)
CHLORIDE: 101 mmol/L (ref 101–111)
CO2: 22 mmol/L (ref 22–32)
Calcium: 9.5 mg/dL (ref 8.9–10.3)
Creatinine, Ser: 16.97 mg/dL — ABNORMAL HIGH (ref 0.61–1.24)
GFR, EST AFRICAN AMERICAN: 3 mL/min — AB (ref >60)
GFR, EST NON AFRICAN AMERICAN: 3 mL/min — AB (ref >60)
Glucose, Bld: 93 mg/dL (ref 65–99)
PHOSPHORUS: 8 mg/dL — AB (ref 2.5–4.6)
POTASSIUM: 4.5 mmol/L (ref 3.5–5.1)
Sodium: 140 mmol/L (ref 135–145)

## 2017-06-26 LAB — ECHOCARDIOGRAM COMPLETE
Height: 65 in
WEIGHTICAEL: 2310.42 [oz_av]

## 2017-06-26 LAB — CBC
HCT: 19.6 % — ABNORMAL LOW (ref 39.0–52.0)
Hemoglobin: 6.7 g/dL — CL (ref 13.0–17.0)
MCH: 28.4 pg (ref 26.0–34.0)
MCHC: 34.2 g/dL (ref 30.0–36.0)
MCV: 83.1 fL (ref 78.0–100.0)
Platelets: 179 K/uL (ref 150–400)
RBC: 2.36 MIL/uL — ABNORMAL LOW (ref 4.22–5.81)
RDW: 16.1 % — ABNORMAL HIGH (ref 11.5–15.5)
WBC: 6.1 K/uL (ref 4.0–10.5)

## 2017-06-26 LAB — FOLATE RBC
Folate, Hemolysate: >620 ng/mL
Hematocrit: 21.5 % — ABNORMAL LOW (ref 37.5–51.0)

## 2017-06-26 LAB — HEPARIN LEVEL (UNFRACTIONATED)
Heparin Unfractionated: <0.1 [IU]/mL — ABNORMAL LOW (ref 0.30–0.70)
Heparin Unfractionated: 0.17 IU/mL — ABNORMAL LOW (ref 0.30–0.70)

## 2017-06-26 LAB — PREPARE RBC (CROSSMATCH)

## 2017-06-26 LAB — TROPONIN I: TROPONIN I: 0.38 ng/mL — AB (ref <0.03)

## 2017-06-26 MED ORDER — SEVELAMER CARBONATE 800 MG PO TABS
1600.0000 mg | ORAL_TABLET | Freq: Three times a day (TID) | ORAL | Status: DC
  Administered 2017-06-26 – 2017-06-27 (×2): 1600 mg via ORAL
  Filled 2017-06-26 (×2): qty 2

## 2017-06-26 MED ORDER — SODIUM CHLORIDE 0.9 % IV SOLN
125.0000 mg | INTRAVENOUS | Status: AC
  Administered 2017-06-26: 125 mg via INTRAVENOUS
  Filled 2017-06-26: qty 10

## 2017-06-26 MED ORDER — SODIUM CHLORIDE 0.9 % IV SOLN
125.0000 mg | INTRAVENOUS | Status: DC
  Administered 2017-06-27: 125 mg via INTRAVENOUS
  Filled 2017-06-26: qty 10

## 2017-06-26 MED ORDER — SODIUM CHLORIDE 0.9 % IV SOLN: 125.0000 mg | INTRAVENOUS | Status: DC

## 2017-06-26 MED ORDER — SODIUM CHLORIDE 0.9 % IV SOLN: Freq: Once | INTRAVENOUS | Status: DC

## 2017-06-26 MED ORDER — DEXTROSE 5 % IV SOLN
1.5000 g | INTRAVENOUS | Status: DC
  Filled 2017-06-26: qty 1.5

## 2017-06-26 NOTE — Procedures (Signed)
I have personally attended this patient's dialysis session.   2nd of 3 back to back treatments 2K bath Transfuse on HD for Hb 6.7  Winston Maes, MD [facility] [phone] Pager 06/26/2017, 3:14 PM

## 2017-06-26 NOTE — Progress Notes (Signed)
ANTICOAGULATION CONSULT NOTE - Follow Up Consult  Pharmacy Consult for heparin Indication: chest pain/ACS  Labs: Recent Labs    06/24/17 1953 06/25/17 0127 06/25/17 0321 06/25/17 0924 06/25/17 1413 06/26/17 0204 06/26/17 1120  HGB 7.5*  --  7.0*  --   --  6.7*  --   HCT 22.2*  --  20.7*  --   --  19.6*  --   PLT 245  --  207  --   --  179  --   HEPARINUNFRC  --   --   --   --   --  <0.10* <0.10*  CREATININE 22.62*  --  23.78*  --   --  16.97*  --   CKTOTAL  --  603*  --   --   --   --   --   CKMB  --  11.6*  --   --   --   --   --   TROPONINI  --   --  0.47* 0.41* 4.35*  --   --     Assessment: 46yo male on IV heparin for ACS. Repeat HL remains undetectable on heparin 900 units/hr. Patient required a transfusion for Hgb drop but no overt s/s of bleeding per RN. Plt wnl.   Goal of Therapy:  Heparin level 0.3-0.7 units/ml   Plan:  -Will increase heparin gtt by 4 units/kg/hr to 1150 units/hr (will hold off on bolus given earlier Hgb drop) -F/u 8hr HL -Monitor daily HL, CBC and s/s of bleeding -Per cards, patient may need cath later this week when H/H is stable   Albertina Parr, PharmD., BCPS Clinical Pharmacist Pager [phone]

## 2017-06-26 NOTE — Progress Notes (Signed)
ANTICOAGULATION CONSULT NOTE - Follow Up Consult  Pharmacy Consult for heparin Indication: chest pain/ACS  Labs: Recent Labs    06/24/17 1953 06/25/17 0127  06/25/17 0321 06/25/17 0924 06/25/17 1413 06/26/17 0204 06/26/17 1120 06/26/17 2124  HGB 7.5*  --   --  7.0*  --   --  6.7*  --   --   HCT 22.2*  --   --  20.7*  21.5*  --   --  19.6*  --   --   PLT 245  --   --  207  --   --  179  --   --   HEPARINUNFRC  --   --   --   --   --   --  <0.10* <0.10* 0.17*  CREATININE 22.62*  --   --  23.78*  --   --  16.97*  --   --   CKTOTAL  --  603*  --   --   --   --   --   --   --   CKMB  --  11.6*  --   --   --   --   --   --   --   TROPONINI  --   --    < > 0.47* 0.41* 4.35*  --  0.38*  --    < > = values in this interval not displayed.    Assessment: 46yo male on IV heparin for ACS. Currently on heparin drip 1150 units/hr, HL 0.17 up from earlier but still < goal.   Patient required a transfusion for Hgb drop but no overt s/s of bleeding per RN. Plt wnl.   Goal of Therapy:  Heparin level 0.3-0.7 units/ml   Plan:  -Will increase heparin gtt to 1350 units/hr  -Monitor daily HL, CBC and s/s of bleeding -Per cards, patient may need cath later this week when H/H is stable   Bonnita Nasuti Pharm.D. CPP, BCPS Clinical Pharmacist [phone] 06/26/2017 10:35 PM

## 2017-06-26 NOTE — Progress Notes (Signed)
Angel [name] TEAM 1 - Stepdown/ICU TEAM  Angel [name]  QPY:[id number] DOB: 1970-10-16 DOA: 06/24/2017 PCP: Gwenlyn Saran, MD    Brief Narrative:  46 y.o. male w/ a Seizure do, ESRD on HD T/Th/Sat, HTN, AAA repair, HLD, CHF(diastolic), and a recent admission for hypertensive emergency from which he left AMA who returned to the ED c/o dyspnea after not having had dialysis for over a week.   In the ED he was noted to be hypertensive at 240/144 w/ Bun 203, Creatinine 22.62, and K 5.2.   Significant Events: 11/18 - admit  11/19 - HD   Subjective: Reports that he is feeling much better post dialysis.  With his BUN significant decrease he is much more engaging and interactive.  He denies pain at the present time.  He denies shortness of breath.  He has noted significant swelling in his legs.  He tells me he is having challenges arranging housing in [place] and that he may need to leave the hospital tomorrow afternoon.  I have explained to him that we will not be medically ready for him to leave tomorrow but that we will assist him in any way we can to address the issues regarding his housing.  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 - Uremia - noncompliance  Nephrology following - planning for daily HD to cath up - will need permanent access for outpt HD (he has previously refused in [place])   Hypertensive emergency  Due to combination of noncompliance w/ meds and HD - trend improving w/ HD tx but not yet at goal - avoid rapid overcorrection w/ current goal to keep SBP no lower than 140 as able   Dyspnea Due to pulmonary edema due to uncontrolled HTN and noncompliance w/ HD - resolved after 1st HD tx - now on RA  Anemia of CKD Plan to transfuse 1U PRBC in HD today - will need epo and Fe load w/ HD at discretion of Nephrology    HLD  GERD  Seizure d/o   DVT prophylaxis: SQ heparin Code Status: FULL CODE Family Communication: no family present at time of exam  Disposition Plan: follow in SDU for now given elevated troponin and need to transfer to SDU yesterday   Consultants:  Nephrology  Cardiology   Antimicrobials:  none  Objective: Blood pressure (!) 174/119, pulse 73, temperature 97.9 F (36.6 C), temperature source Oral, resp. rate 12, height 5\' 5"  (1.651 m), weight 65.5 kg (144 lb 6.4 oz), SpO2 100 %.  Intake/Output Summary (Last 24 hours) at 06/26/2017 1003 Last data filed at 06/26/2017 0800 Gross per 24 hour  Intake 398.8 ml  Output 1000 ml  Net -601.2 ml   Filed Weights   06/24/17 1930 06/25/17 1809 06/26/17 0306  Weight: 57.6 kg (127 lb) 66 kg (145 lb 8.1 oz) 65.5 kg (144 lb 6.4 oz)    Examination: General: No acute respiratory distress at rest at bedside  Lungs: mild bibasilar crackles - no wheezing  Cardiovascular: RRR - no rub or M Abdom:  NT/ND, soft, bs+, no mass, no rebound Extremities: 2+ B LE edema (hanging off bed)  CBC: Recent Labs  Lab 06/20/17 0036 06/24/17 1953 06/25/17 0321 06/26/17 0204  WBC 8.0 7.3 6.9 6.1  NEUTROABS 5.6  --   --   --  HGB 7.0* 7.5* 7.0* 6.7*  HCT 20.9* 22.2* 20.7* 19.6*  MCV 85.7 83.8 84.1 83.1  PLT 222 245 207 494   Basic Metabolic Panel: Recent Labs  Lab 06/20/17 0036 06/24/17 1953 06/25/17 0321 06/26/17 0204  NA 139 141 143 140  K 5.2* 5.2* 5.2* 4.5  CL 99* 102 103 101  CO2 19* 16* 18* 22  GLUCOSE 95 112* 125* 93  BUN 139* 203* 209* 132*  CREATININE 18.19* 22.62* 23.78* 16.97*  CALCIUM 9.4 9.8 9.7 9.5  PHOS  --   --   --  8.0*   GFR: Estimated Creatinine Clearance: 4.7 mL/min (A) (by C-G formula based on SCr of 16.97 mg/dL (H)).  Liver Function Tests: Recent Labs  Lab 06/20/17 0036 06/25/17 0321 06/26/17 0204  AST 177* 24  --   ALT 468* 147*  --   ALKPHOS 115 90  --   BILITOT 1.1 0.8  --   PROT 6.6 6.3*  --    ALBUMIN 3.2* 3.2* 2.9*    Cardiac Enzymes: Recent Labs  Lab 06/25/17 0127 06/25/17 0321 06/25/17 0924 06/25/17 1413  CKTOTAL 603*  --   --   --   CKMB 11.6*  --   --   --   TROPONINI  --  0.47* 0.41* 4.35*      Scheduled Meds: . aspirin EC  81 mg Oral Daily  . atorvastatin  40 mg Oral QHS  . calcitRIOL  0.75 mcg Oral Q M,W,F-1800  . carvedilol  25 mg Oral BID WC  . cloNIDine  0.2 mg Oral TID  . darbepoetin (ARANESP) injection - DIALYSIS  200 mcg Intravenous Q Mon-HD  . furosemide  80 mg Intravenous Q12H  . lisinopril  40 mg Oral Daily  . sevelamer carbonate  1,600 mg Oral TID WC     LOS: 2 days   [name], MD [facility] Office  [phone] Pager - Text Page per Amion as per below:  On-Call/Text Page:      [name]      password [email or web address]  If 7PM-7AM, please contact night-coverage [email or web address] Password [email or web address] 06/26/2017, 10:03 AM

## 2017-06-26 NOTE — Consult Note (Signed)
Hospital Consult    Reason for Consult:  In need of dialysis access Requesting Physician:  Lorrene Reid MRN #:  [id number]  History of Present Illness: This is a 46 y.o. male who is on dialysis.  He moved from [place] to [place] without dialysis arrangements. He was admitted on November 14 with severe hypertension (244/144) and dyspnea, but signed out AMA.  He dialyzes via a [place] that was placed in [place].  Pt is left hand dominant.    He states that he had an AAA repair in July 2018 at [facility].    He is on a beta blocker, clonidine, ACEI and hydralazine for blood pressure management.  He takes a daily aspirin.  The pt is on a statin for cholesterol management.   Past Medical History:  Diagnosis Date  . CHF (congestive heart failure) ([place])   . ESRD on dialysis ([place])   . FSGS (focal segmental glomerulosclerosis)   . GERD (gastroesophageal reflux disease)   . HLD (hyperlipidemia)   . Hypertension   . Renal disorder   . Seizure ([place])   . Stroke (cerebrum) [facility])     Past Surgical History:  Procedure Laterality Date  . ABDOMINAL AORTIC ANEURYSM REPAIR    . KIDNEY SURGERY      Allergies  Allergen Reactions  . Hydralazine Hcl Swelling    Leg swelling     Prior to Admission medications   Medication Sig Start Date End Date Taking? Authorizing Provider  aspirin EC 81 MG tablet Take 81 mg daily by mouth.   Yes [provider]  atorvastatin (LIPITOR) 40 MG tablet Take 40 mg at bedtime by mouth.   Yes [provider]  carvedilol (COREG) 25 MG tablet Take 25 mg 2 (two) times daily with a meal by mouth.   Yes [provider]  cloNIDine (CATAPRES) 0.2 MG tablet Take 0.2 mg 2 (two) times daily by mouth.   Yes [provider]  furosemide (LASIX) 80 MG tablet Take 80 mg at bedtime by mouth.   Yes [provider]  hydrALAZINE (APRESOLINE) 50 MG tablet Take 100 mg 3 (three) times daily by mouth.   Yes [provider]  lisinopril  (PRINIVIL,ZESTRIL) 40 MG tablet Take 40 mg daily by mouth.   Yes [provider]    Social History   Socioeconomic History  . Marital status: Single    Spouse name: Not on file  . Number of children: Not on file  . Years of education: Not on file  . Highest education level: Not on file  Social Needs  . Financial resource strain: Not on file  . Food insecurity - worry: Not on file  . Food insecurity - inability: Not on file  . Transportation needs - medical: Not on file  . Transportation needs - non-medical: Not on file  Occupational History  . Not on file  Tobacco Use  . Smoking status: Never Smoker  . Smokeless tobacco: Never Used  Substance and Sexual Activity  . Alcohol use: No    Frequency: Never  . Drug use: No  . Sexual activity: Not on file  Other Topics Concern  . Not on file  Social History Narrative  . Not on file     Family History  Problem Relation Age of Onset  . Hypertension Mother   . Kidney disease Mother   . Asthma Father   . Hypertension Father   . Kidney disease Father   . Hypertension Sister   . Heart  disease Sister   . Kidney disease Sister   . Heart disease Brother   . Hypertension Brother   . COPD Brother     ROS: [x]  Positive   [ ]  Negative   [ ]  All sytems reviewed and are negative  Cardiac: []  chest pain/pressure []  palpitations []  SOB lying flat [x]  DOE [x]  Hx CHF  Vascular: []  pain in legs while walking []  pain in legs at rest []  pain in legs at night []  non-healing ulcers []  hx of DVT [x]  swelling in legs  Pulmonary: []  productive cough []  asthma/wheezing []  home O2  Neurologic: []  weakness in []  arms []  legs []  numbness in []  arms []  legs [x]  hx of CVA []  mini stroke [x]  hx seizure [] difficulty speaking or slurred speech []  temporary loss of vision in one eye []  dizziness [x]  hx blood clot in brain-on aspirin.  Hematologic: []  hx of cancer []  bleeding problems []  problems with blood clotting  easily  Endocrine:   []  diabetes []  thyroid disease  GI []  vomiting blood []  blood in stool [x]  GERD  GU: [x]  CKD/renal failure [x]  HD--[]  M/W/F or []  T/T/S []  burning with urination []  blood in urine  Psychiatric: []  anxiety []  depression  Musculoskeletal: []  arthritis []  joint pain  Integumentary: []  rashes []  ulcers  Constitutional: []  fever []  chills   Physical Examination  Vitals:   06/26/17 0500 06/26/17 0804  BP: (!) 167/118 (!) 174/119  Pulse: 78 73  Resp: 12 12  Temp:  97.9 F (36.6 C)  SpO2: 100% 100%   Body mass index is 24.03 kg/m.  General:  WDWN in NAD; somnolent  Gait: Not observed HENT: WNL, normocephalic Pulmonary: normal non-labored breathing Cardiac: regular, without  Murmurs, rubs or gallops; without carotid bruits Abdomen:  soft, NT/ND, no masses; well healed laparotomy scar Skin: without rashes Vascular Exam/Pulses:  Right Left  Radial 2+ (normal) 2+ (normal)  Ulnar 2+ (normal) 2+ (normal)  DP 2+ (normal) 2+ (normal)  PT Unable to palpate   Unable to palpate    Extremities: without ischemic changes, without Gangrene , without cellulitis; without open wounds; BLE edema; IV in right and left forearm. Musculoskeletal: no muscle wasting or atrophy  Neurologic: A&O X 3;  No focal weakness or paresthesias are detected; speech is fluent/normal Psychiatric:  The pt has flat affect and is somnolent   CBC    Component Value Date/Time   WBC 6.1 06/26/2017 0204   RBC 2.36 (L) 06/26/2017 0204   HGB 6.7 (LL) 06/26/2017 0204   HCT 19.6 (L) 06/26/2017 0204   PLT 179 06/26/2017 0204   MCV 83.1 06/26/2017 0204   MCH 28.4 06/26/2017 0204   MCHC 34.2 06/26/2017 0204   RDW 16.1 (H) 06/26/2017 0204   LYMPHSABS 1.5 06/20/2017 0036   MONOABS 0.6 06/20/2017 0036   EOSABS 0.3 06/20/2017 0036   BASOSABS 0.0 06/20/2017 0036    BMET    Component Value Date/Time   NA 140 06/26/2017 0204   K 4.5 06/26/2017 0204   CL 101 06/26/2017 0204     CO2 22 06/26/2017 0204   GLUCOSE 93 06/26/2017 0204   BUN 132 (H) 06/26/2017 0204   CREATININE 16.97 (H) 06/26/2017 0204   CALCIUM 9.5 06/26/2017 0204   GFRNONAA 3 (L) 06/26/2017 0204   GFRAA 3 (L) 06/26/2017 0204    COAGS: No results found for: INR, PROTIME   Non-Invasive Vascular Imaging:   BUE vein mapping ordered.  Statin:  Yes.  Beta Blocker:  Yes.   Aspirin:  Yes.   ACEI:  Yes.   ARB:  No. CCB use:  No Other antiplatelets/anticoagulants:  Yes.   Heparin gtt for chest pain/ACS   ASSESSMENT/PLAN: This is a 46 y.o. male with ESRD in need of dialysis access   -pt is left hand dominant.  He does have an IV in the right forearm.  Will ask RN to remove this and restrict right arm.   -will make further recommendations based on vein mapping once completed.   -pt is on heparin for NSTEMI/ACS   Leontine Locket, PA-C [facility] [phone]  I agree with the above.  I have seen and examined the patient.  He is currently on dialysis via a catheter.  He needs permanent access.  He is left-handed.  I have spoken with Dr. Angelena Form.  He is being evaluated for aNSTEMI.  There are plans for cardiac catheterization.  No vascular surgery procedure will be performed until he has been cleared by cardiology which will likely be next week.  We will continue to follow the patient while he is in the hospital  [facility]

## 2017-06-26 NOTE — Progress Notes (Signed)
[facility] Rounding Note Subjective/Interval History:  Had HD yesterday (slow/short/trying to avoid disequilibrium) For treatment again today Numbers looking better and so is patient - [facility] better Says getting apartment. Has kids under 10 trying to arrange for them   Objective Vital signs in last 24 hours: Vitals:   06/26/17 0300 06/26/17 0306 06/26/17 0400 06/26/17 0500  BP: (!) 157/114  (!) 165/118 (!) 167/118  Pulse: 79  84 78  Resp: 17  15 12   Temp:  97.8 F (36.6 C)    TempSrc:  Oral    SpO2: 95%  99% 100%  Weight:  65.5 kg (144 lb 6.4 oz)    Height:       Weight change: 8.393 kg (18 lb 8.1 oz)  Intake/Output Summary (Last 24 hours) at 06/26/2017 0814 Last data filed at 06/26/2017 0500 Gross per 24 hour  Intake 371.8 ml  Output 1000 ml  Net -628.2 ml   Physical Exam:  Blood pressure (!) 167/118, pulse 78, temperature 97.8 F (36.6 C), temperature source Oral, resp. rate 12, height 5\' 5"  (1.651 m), weight 65.5 kg (144 lb 6.4 oz), SpO2 100 %.  Gen:  Lines/tubes: R TDC Skin: no rash, cyanosis Neck: no JVD Chest: Clear Heart: Regular S1S2 No S3, + S4 Abdomen: soft, not tender Ext: No sig edema Neuro: Cannot fully assess Dialysis Access: [facility] R IJ site not tender   Recent Labs  Lab 06/20/17 0036 06/24/17 1953 06/25/17 0321 06/26/17 0204  NA 139 141 143 140  K 5.2* 5.2* 5.2* 4.5  CL 99* 102 103 101  CO2 19* 16* 18* 22  GLUCOSE 95 112* 125* 93  BUN 139* 203* 209* 132*  CREATININE 18.19* 22.62* 23.78* 16.97*  CALCIUM 9.4 9.8 9.7 9.5  PHOS  --   --   --  8.0*    Recent Labs  Lab 06/20/17 0036 06/25/17 0321 06/26/17 0204  AST 177* 24  --   ALT 468* 147*  --   ALKPHOS 115 90  --   BILITOT 1.1 0.8  --   PROT 6.6 6.3*  --   ALBUMIN 3.2* 3.2* 2.9*    Recent Labs  Lab 06/20/17 0036 06/24/17 1953 06/25/17 0321 06/26/17 0204  WBC 8.0 7.3 6.9 6.1  NEUTROABS 5.6  --   --   --   HGB 7.0* 7.5* 7.0* 6.7*  HCT 20.9* 22.2* 20.7* 19.6*  MCV 85.7 83.8 84.1  83.1  PLT 222 245 207 179    Recent Labs  Lab 06/25/17 0127 06/25/17 0321 06/25/17 0924 06/25/17 1413  CKTOTAL 603*  --   --   --   CKMB 11.6*  --   --   --   TROPONINI  --  0.47* 0.41* 4.35*    Component Value Date/Time   IRON 30 (L) 06/25/2017 0127   TIBC 241 (L) 06/25/2017 0127   FERRITIN 1,472 (H) 06/25/2017 0127   IRONPCTSAT 12 (L)     Studies/Results: Dg Chest 2 View  Result Date: 06/24/2017 CLINICAL DATA:  Shortness of breath EXAM: CHEST  2 VIEW COMPARISON:  Four days ago FINDINGS: Cardiopericardial enlargement that is stable. Aortic tortuosity. Dialysis catheter on the right with tip at the right atrium. There is no edema, consolidation, effusion, or pneumothorax. No acute osseous finding. IMPRESSION: 1. No acute finding. 2. Cardiomegaly. Electronically Signed   By: Monte Fantasia M.D.   On: 06/24/2017 20:54   Medications: . sodium chloride    . ferric gluconate (FERRLECIT/NULECIT) IV Stopped (06/25/17 1604)  .  heparin 900 Units/hr (06/26/17 0500)   . aspirin EC  81 mg Oral Daily  . atorvastatin  40 mg Oral QHS  . calcitRIOL  0.75 mcg Oral Q M,W,F-1800  . carvedilol  25 mg Oral BID WC  . cloNIDine  0.2 mg Oral TID  . darbepoetin (ARANESP) injection - DIALYSIS  200 mcg Intravenous Q Mon-HD  . furosemide  80 mg Intravenous Q12H  . lisinopril  40 mg Oral Daily   Dialysis prescription: TTS F180, 3.5 hours, 400/800, EDW 56.5 kg 3251 bath Heparin 2000 bolus Calcitriol 0.75 Aranesp 200/week Hb 11/8 was 7 10/18 Ca 9.5, phos 4.8 04/2017 PTH 684 (Last HD PTA was 06/14/17)  Background: 46 y.o. year-old AAM, PMH HTN, AAA repair ([facility]), ESRD HD since 08/2016, most recently [facility] [place] (per their staff has been non-compliant) who moved to [facility] with no HD arrangements made. Admitted 11/14 with severe HTN, signed out AMA before renal could see. Presented again 11/19 uremic, very hypertensive. Last HD PTA 06/14/17. Has only [facility] for  access.  Assessment/Recommendations  1. ESRD - on dialysis since 08/2016 in [place]. Last HD PTA was 06/14/17. BUN >200. Proceeding as if dialysis naive to avoid disequilibrium  - short daily and progress back to usual time (and in fact with [facility] will need to be 4 hour pt ultimately). Starting CLIP for HD in [place] 2. Dialysis access - according to staff at his old unit refused AVF/AVG in past. In order to be accepted at [facility] clinic will need one or the other before he can be placed. Will start CLIP process. Pt agreeable to vein mapping and access. Vein mapping ordered, consulted VVS 3. HTN - severe. Oral meds per primary svce. 4. Anemia - resumed Aranesp 11/19, started IV iron with HD 5. Secondary HPT - Phosphorus 8. Start non-ca based binder Vonita Moss) Resume calcitriol with HD  Taavi Maes, MD [facility] [phone] pager 06/26/2017, 8:14 AM

## 2017-06-26 NOTE — Progress Notes (Signed)
ANTICOAGULATION CONSULT NOTE - Follow Up Consult  Pharmacy Consult for heparin Indication: chest pain/ACS  Labs: Recent Labs    06/24/17 1953 06/25/17 0127 06/25/17 0321 06/25/17 0924 06/25/17 1413 06/26/17 0204  HGB 7.5*  --  7.0*  --   --  6.7*  HCT 22.2*  --  20.7*  --   --  19.6*  PLT 245  --  207  --   --  179  HEPARINUNFRC  --   --   --   --   --  <0.10*  CREATININE 22.62*  --  23.78*  --   --   --   CKTOTAL  --  603*  --   --   --   --   CKMB  --  11.6*  --   --   --   --   TROPONINI  --   --  0.47* 0.41* 4.35*  --     Assessment: 46yo male undetectable on heparin with initial dosing for possible ACS; Hgb trending down but no overt signs of bleeding.  Goal of Therapy:  Heparin level 0.3-0.7 units/ml   Plan:  Will increase heparin gtt by 4 units/kg/hr to 900 units/hr (will hold off on bolus given Hgb) and check level in [place], PharmD, BCPS  06/26/2017,3:46 AM

## 2017-06-26 NOTE — Progress Notes (Signed)
*  PRELIMINARY RESULTS* Echocardiogram 2D Echocardiogram has been performed.  Leavy Cella 06/26/2017, 11:55 AM

## 2017-06-26 NOTE — Plan of Care (Signed)
Continue current plan of care.

## 2017-06-26 NOTE — Progress Notes (Signed)
Progress Note  Patient Name: Angel [name] Date of Encounter: 06/26/2017  Primary Cardiologist: Dr. Lavone Neri [facility]  Subjective   Patient states he does not remember speaking with me yesterday. He report prior to admission, he was having 4 days of substernal chest pain, orthopnea, headaches, cloudy vision, and nausea. He denies history of smoking and denies any FHx of cardiac disease. He is in agreement to start process for AVF/AVG so he can get established with outpt HD center in the area.  Currently he reports that all of his symptoms have resolved after he received HD yesterday.   Inpatient Medications    Scheduled Meds: . aspirin EC  81 mg Oral Daily  . atorvastatin  40 mg Oral QHS  . calcitRIOL  0.75 mcg Oral Q M,W,F-1800  . carvedilol  25 mg Oral BID WC  . cloNIDine  0.2 mg Oral TID  . darbepoetin (ARANESP) injection - DIALYSIS  200 mcg Intravenous Q Mon-HD  . furosemide  80 mg Intravenous Q12H  . lisinopril  40 mg Oral Daily   Continuous Infusions: . sodium chloride    . ferric gluconate (FERRLECIT/NULECIT) IV Stopped (06/25/17 1604)  . heparin 900 Units/hr (06/26/17 0500)   PRN Meds: acetaminophen **OR** [DISCONTINUED] acetaminophen, hydrALAZINE, labetalol, promethazine, traMADol, zolpidem   Vital Signs    Vitals:   06/26/17 0300 06/26/17 0306 06/26/17 0400 06/26/17 0500  BP: (!) 157/114  (!) 165/118 (!) 167/118  Pulse: 79  84 78  Resp: 17  15 12   Temp:  97.8 F (36.6 C)    TempSrc:  Oral    SpO2: 95%  99% 100%  Weight:  144 lb 6.4 oz (65.5 kg)    Height:        Intake/Output Summary (Last 24 hours) at 06/26/2017 0805 Last data filed at 06/26/2017 0500 Gross per 24 hour  Intake 371.8 ml  Output 1000 ml  Net -628.2 ml   Filed Weights   06/24/17 1930 06/25/17 1809 06/26/17 0306  Weight: 127 lb (57.6 kg) 145 lb 8.1 oz (66 kg) 144 lb 6.4 oz (65.5 kg)    Telemetry    SR - Personally Reviewed  ECG    N/a today  Physical Exam   GEN: No  acute distress, sitting up on side of bed.   Neck: +JVD Cardiac: RRR, no murmurs, rubs, or gallops.  Respiratory: Clear to auscultation bilaterally. GI: Soft, nontender, non-distended, +BS  MS: 1+ pitting edema bil LEs. Neuro: CN 2-12 grossly intact, strength in bil extremities intact. Psych: Normal affect   Labs    Chemistry Recent Labs  Lab 06/20/17 0036 06/24/17 1953 06/25/17 0321 06/26/17 0204  NA 139 141 143 140  K 5.2* 5.2* 5.2* 4.5  CL 99* 102 103 101  CO2 19* 16* 18* 22  GLUCOSE 95 112* 125* 93  BUN 139* 203* 209* 132*  CREATININE 18.19* 22.62* 23.78* 16.97*  CALCIUM 9.4 9.8 9.7 9.5  PROT 6.6  --  6.3*  --   ALBUMIN 3.2*  --  3.2* 2.9*  AST 177*  --  24  --   ALT 468*  --  147*  --   ALKPHOS 115  --  90  --   BILITOT 1.1  --  0.8  --   GFRNONAA 3* 2* 2* 3*  GFRAA 3* 2* 2* 3*  ANIONGAP 21* 23* 22* 17*     Hematology Recent Labs  Lab 06/24/17 1953 06/25/17 0321 06/26/17 0204  WBC 7.3 6.9 6.1  RBC 2.65* 2.46* 2.36*  HGB 7.5* 7.0* 6.7*  HCT 22.2* 20.7* 19.6*  MCV 83.8 84.1 83.1  MCH 28.3 28.5 28.4  MCHC 33.8 33.8 34.2  RDW 16.1* 16.1* 16.1*  PLT 245 207 179    Cardiac Enzymes Recent Labs  Lab 06/25/17 0321 06/25/17 0924 06/25/17 1413  TROPONINI 0.47* 0.41* 4.35*    Recent Labs  Lab 06/24/17 2252  TROPIPOC 0.41*     BNPNo results for input(s): BNP, PROBNP in the last 168 hours.   DDimer No results for input(s): DDIMER in the last 168 hours.   Radiology    Dg Chest 2 View  Result Date: 06/24/2017 CLINICAL DATA:  Shortness of breath EXAM: CHEST  2 VIEW COMPARISON:  Four days ago FINDINGS: Cardiopericardial enlargement that is stable. Aortic tortuosity. Dialysis catheter on the right with tip at the right atrium. There is no edema, consolidation, effusion, or pneumothorax. No acute osseous finding. IMPRESSION: 1. No acute finding. 2. Cardiomegaly. Electronically Signed   By: Monte Fantasia M.D.   On: 06/24/2017 20:54    Cardiac Studies     06/14/2016 Nondiagnostic exercise stress due to not achieving target HR.  06/14/2016 Echo: -The left ventricular size is normal. -There is moderate concentric left ventricular hypertrophy. -Left ventricular systolic function is low normal. -LV ejection fraction = 50-55%. -Left ventricular filling pattern is impaired. -The right ventricle is normal in size and function. -There is mild tricuspid regurgitation. -No significant stenosis seen -No pulmonary hypertension. -Estimated right atrial pressure is 5 mmHg.. -There is no pericardial effusion. -There is no comparison study available.  Patient Profile     Angel [name] is a 46 y.o. male with PMH of ESRD on HD, HTN, dCHF, h/o CVA and seizure, AAA repair, who is being seen today for the evaluation of chest pain. He presented to ED with dyspnea and was found to be in hypertensive emergency due to missing HD; he was also found to have troponin elevation to 4.  Assessment & Plan    NSTEMI: In setting of hypertensive emergency from missing HD. Troponin to 4.35. He was started on heparin yesterday.  --heparin gtt --asa 81mg  daily, lipitor 40mg  daily, coreg 25mg  BID, lisinopril 40mg  daily --f/u echo --may pursue cath at some point during hospitalization  Hypertensive emergency: With above from missing HD. Nephrology is following and will do daily HD. BP significantly improved this AM to 167/118. --continue home meds  ESRD on HD Symptomatic uremia: BUN to 132 this morning following HD. Will get daily HD for next couple of days; CLIP process started and VVS consulted by nephro for permanent access.  Anemia in setting of ESRD and iron deficiency: Hgb 6.7 this morning. Will likely receive unit of pRBCs with HD today. S/p aranesp and iron with HD yesterday.  For questions or updates, please contact [place] Please consult [email or web address] for contact info under Cardiology/STEMI.    Signed, Alphonzo Grieve, MD  06/26/2017,  8:05 AM    I have personally seen and examined this patient with Dr. Jari Favre. I agree with the assessment and plan as outlined above. He is admitted with hypertensive emergency after missing HD for many days, also anemia. Troponin elevated yesterday at 0.4 then up to 4.35. It is likely that his troponin is elevated due to demand ischemia in setting of HTN emergency and anemia but cannot exclude CAD. He does have risk factors for CAD. I have discussed this with the patient and his mother this am. He will have  HD today with blood transfusion. Will discuss possible cath later this week if H/H stable. We could also opt for an outpatient stress test since his chest pain has resolved following HD and BP control. I will see him in the morning to discuss. Continue heparin for now. Repeat troponin to trend.   Lauree Chandler 06/26/2017 10:46 AM

## 2017-06-27 ENCOUNTER — Inpatient Hospital Stay (HOSPITAL_COMMUNITY): Payer: Medicare Other

## 2017-06-27 ENCOUNTER — Encounter (HOSPITAL_COMMUNITY)
Admission: EM | Discharge status: Against Medical Advice | Payer: Self-pay | Source: Home / Self Care | Attending: Internal Medicine

## 2017-06-27 ENCOUNTER — Encounter (HOSPITAL_COMMUNITY): Payer: Medicare Other

## 2017-06-27 DIAGNOSIS — I429 Cardiomyopathy, unspecified: Secondary | ICD-10-CM

## 2017-06-27 LAB — RENAL FUNCTION PANEL
ALBUMIN: 2.5 g/dL — AB (ref 3.5–5.0)
ANION GAP: 13 (ref 5–15)
BUN: 75 mg/dL — ABNORMAL HIGH (ref 6–20)
CO2: 24 mmol/L (ref 22–32)
Calcium: 8.8 mg/dL — ABNORMAL LOW (ref 8.9–10.3)
Chloride: 99 mmol/L — ABNORMAL LOW (ref 101–111)
Creatinine, Ser: 11.29 mg/dL — ABNORMAL HIGH (ref 0.61–1.24)
GFR calc Af Amer: 5 mL/min — ABNORMAL LOW (ref >60)
GFR calc non Af Amer: 5 mL/min — ABNORMAL LOW (ref >60)
GLUCOSE: 112 mg/dL — AB (ref 65–99)
PHOSPHORUS: 7 mg/dL — AB (ref 2.5–4.6)
POTASSIUM: 3.9 mmol/L (ref 3.5–5.1)
Sodium: 136 mmol/L (ref 135–145)

## 2017-06-27 LAB — TYPE AND SCREEN
ABO/RH(D): A POS
Antibody Screen: NEGATIVE
Unit division: 0

## 2017-06-27 LAB — HEPARIN LEVEL (UNFRACTIONATED)
HEPARIN UNFRACTIONATED: 0.22 [IU]/mL — AB (ref 0.30–0.70)
HEPARIN UNFRACTIONATED: 0.53 [IU]/mL (ref 0.30–0.70)

## 2017-06-27 LAB — CBC
HEMATOCRIT: 25.1 % — AB (ref 39.0–52.0)
HEMOGLOBIN: 8.6 g/dL — AB (ref 13.0–17.0)
MCH: 28.5 pg (ref 26.0–34.0)
MCHC: 34.3 g/dL (ref 30.0–36.0)
MCV: 83.1 fL (ref 78.0–100.0)
Platelets: 201 10*3/uL (ref 150–400)
RBC: 3.02 MIL/uL — ABNORMAL LOW (ref 4.22–5.81)
RDW: 15.8 % — ABNORMAL HIGH (ref 11.5–15.5)
WBC: 8.4 10*3/uL (ref 4.0–10.5)

## 2017-06-27 LAB — BPAM RBC
Blood Product Expiration Date: 201811302359
ISSUE DATE / TIME: 201811201500
Unit Type and Rh: 6200

## 2017-06-27 LAB — PROTIME-INR
INR: 1.21
PROTHROMBIN TIME: 15.2 s (ref 11.4–15.2)

## 2017-06-27 SURGERY — ARTERIOVENOUS (AV) FISTULA CREATION
Anesthesia: Monitor Anesthesia Care | Laterality: Left

## 2017-06-27 MED ORDER — CALCITRIOL 0.25 MCG PO CAPS
ORAL_CAPSULE | ORAL | Status: AC
  Administered 2017-06-27: 13:00:00
  Filled 2017-06-27: qty 1

## 2017-06-27 MED ORDER — CALCITRIOL 0.5 MCG PO CAPS
ORAL_CAPSULE | ORAL | Status: AC
  Filled 2017-06-27: qty 1

## 2017-06-27 NOTE — Procedures (Signed)
I have personally attended this patient's dialysis session.   4K bath K 3.5 3 L goal (2+ edema TDC 400  Bekim Maes, MD [place] Pager 06/27/2017, 11:02 AM

## 2017-06-27 NOTE — Discharge Summary (Signed)
Patient left after dialysis. He has history of leaving AMA. Cardiology was monitoring and going to give further recommendations regarding NSTEMI. But patient left AMA stating he had something to tend to this afternoon.  Erhard Senske, [employer]

## 2017-06-27 NOTE — Progress Notes (Signed)
ANTICOAGULATION CONSULT NOTE - Follow Up Consult  Pharmacy Consult for Heparin Indication: chest pain/ACS  Labs: Recent Labs    06/25/17 0127  06/25/17 0321 06/25/17 0924 06/25/17 1413  06/26/17 0204 06/26/17 1120 06/26/17 2124 06/27/17 0324  HGB  --   --  7.0*  --   --   --  6.7*  --   --  8.6*  HCT  --   --  20.7*  21.5*  --   --   --  19.6*  --   --  25.1*  PLT  --   --  207  --   --   --  179  --   --  201  LABPROT  --   --   --   --   --   --   --   --   --  15.2  INR  --   --   --   --   --   --   --   --   --  1.21  HEPARINUNFRC  --   --   --   --   --    < > <0.10* <0.10* 0.17* 0.22*  CREATININE  --   --  23.78*  --   --   --  16.97*  --   --  11.29*  CKTOTAL 603*  --   --   --   --   --   --   --   --   --   CKMB 11.6*  --   --   --   --   --   --   --   --   --   TROPONINI  --    < > 0.47* 0.41* 4.35*  --   --  0.38*  --   --    < > = values in this interval not displayed.    Assessment: 46yo male on IV heparin for ACS. Repeat HL remains low on heparin 1350 units/hr. Patient required a transfusion for Hgb drop but no overt s/s of bleeding per RN>>up to 8.6 . Plt wnl.   Goal of Therapy:  Heparin level 0.3-0.7 units/ml   Plan:  -Inc heparin to 1550 units/hr -1300 HL -Per cards, patient may need cath later this week when H/H is stable   Narda Bonds, PharmD, [place] Pharmacist Phone: [phone]

## 2017-06-27 NOTE — Clinical Social Work Note (Signed)
CSW received call from RN that patient was still stating he need to be at the [employer] at 1:30. CSW went to speak with him. Mother at bedside. Patient remembers our discussion and the plan to change his appointment to Wednesday at 10:00 am. He now states this is too far out. CSW called Ms. Randol Kern. She states the Secondary school teacher has left for the day so he would be unable to sign his lease today. They have now rescheduled for Monday at 12:00 pm. Patient states he still plans to leave AMA because he rented a uhaul and is running out of money. Patient plans to stay with his mother. His two children are with her as well. RN notified.  CSW signing off.   Dayton Scrape, [place]

## 2017-06-27 NOTE — Progress Notes (Signed)
Progress Note  Patient Name: Angel [name] Date of Encounter: 06/27/2017  Primary Cardiologist: Dr. Lavone Neri [facility]  Subjective   Patient states he feels a lot better. He denies chest pain, shortness of breath. He states he will have to leave today to sign his lease or he will be homeless. Had apparently discussed with vascular that he will be re-admitted on Monday? No plan for his dialysis needs yet.  Inpatient Medications    Scheduled Meds: . aspirin EC  81 mg Oral Daily  . atorvastatin  40 mg Oral QHS  . calcitRIOL  0.75 mcg Oral Q M,W,F-1800  . carvedilol  25 mg Oral BID WC  . cloNIDine  0.2 mg Oral TID  . darbepoetin (ARANESP) injection - DIALYSIS  200 mcg Intravenous Q Mon-HD  . lisinopril  40 mg Oral Daily  . sevelamer carbonate  1,600 mg Oral TID WC   Continuous Infusions: . cefUROXime (ZINACEF)  IV    . [START ON 06/29/2017] ferric gluconate (FERRLECIT/NULECIT) IV    . heparin 1,550 Units/hr (06/27/17 0425)   PRN Meds: acetaminophen **OR** [DISCONTINUED] acetaminophen, hydrALAZINE, labetalol, promethazine, traMADol, zolpidem   Vital Signs    Vitals:   06/26/17 2122 06/26/17 2323 06/27/17 0329 06/27/17 0600  BP: (!) 158/119 (!) 144/104 (!) 167/116 (!) 166/116  Pulse:  73 78 94  Resp: 14 10 19 13   Temp:  98.1 F (36.7 C) 97.9 F (36.6 C)   TempSrc:  Oral Oral   SpO2:  91% 99% 94%  Weight:    138 lb 3.2 oz (62.7 kg)  Height:        Intake/Output Summary (Last 24 hours) at 06/27/2017 0747 Last data filed at 06/27/2017 0400 Gross per 24 hour  Intake 1953.36 ml  Output 4000 ml  Net -2046.64 ml   Filed Weights   06/26/17 1311 06/26/17 1625 06/27/17 0600  Weight: 145 lb 11.6 oz (66.1 kg) 137 lb 9.1 oz (62.4 kg) 138 lb 3.2 oz (62.7 kg)    Telemetry    SR - Personally Reviewed  ECG    N/a today  Physical Exam   GEN: No acute distress, seen in HD.   Neck: +JVD Cardiac: RRR, no murmurs, rubs, or gallops.  Respiratory: Clear to auscultation  bilaterally. GI: Soft, nontender, non-distended, +BS  MS: minimal pitting edema bil LEs. Neuro: CN 2-12 grossly intact, strength in bil extremities intact. Psych: Normal affect   Labs    Chemistry Recent Labs  Lab 06/25/17 0321 06/26/17 0204 06/27/17 0324  NA 143 140 136  K 5.2* 4.5 3.9  CL 103 101 99*  CO2 18* 22 24  GLUCOSE 125* 93 112*  BUN 209* 132* 75*  CREATININE 23.78* 16.97* 11.29*  CALCIUM 9.7 9.5 8.8*  PROT 6.3*  --   --   ALBUMIN 3.2* 2.9* 2.5*  AST 24  --   --   ALT 147*  --   --   ALKPHOS 90  --   --   BILITOT 0.8  --   --   GFRNONAA 2* 3* 5*  GFRAA 2* 3* 5*  ANIONGAP 22* 17* 13     Hematology Recent Labs  Lab 06/25/17 0321 06/26/17 0204 06/27/17 0324  WBC 6.9 6.1 8.4  RBC 2.46* 2.36* 3.02*  HGB 7.0* 6.7* 8.6*  HCT 20.7*  21.5* 19.6* 25.1*  MCV 84.1 83.1 83.1  MCH 28.5 28.4 28.5  MCHC 33.8 34.2 34.3  RDW 16.1* 16.1* 15.8*  PLT 207 179 201  Cardiac Enzymes Recent Labs  Lab 06/25/17 0321 06/25/17 0924 06/25/17 1413 06/26/17 1120  TROPONINI 0.47* 0.41* 4.35* 0.38*    Recent Labs  Lab 06/24/17 2252  TROPIPOC 0.41*     BNPNo results for input(s): BNP, PROBNP in the last 168 hours.   DDimer No results for input(s): DDIMER in the last 168 hours.   Radiology    No results found.  Cardiac Studies   06/14/2016 Nondiagnostic exercise stress due to not achieving target HR.  06/14/2016 Echo: -The left ventricular size is normal. -There is moderate concentric left ventricular hypertrophy. -Left ventricular systolic function is low normal. -LV ejection fraction = 50-55%. -Left ventricular filling pattern is impaired. -The right ventricle is normal in size and function. -There is mild tricuspid regurgitation. -No significant stenosis seen -No pulmonary hypertension. -Estimated right atrial pressure is 5 mmHg.. -There is no pericardial effusion. -There is no comparison study available.  Echo 06/26/17: - Left ventricle: The  cavity size was normal. There was moderate   concentric hypertrophy. Systolic function was severely reduced.   The estimated ejection fraction was in the range of 25% to 30%.   Severe diffuse hypokinesis with no identifiable regional   variations. Features are consistent with a pseudonormal left   ventricular filling pattern, with concomitant abnormal relaxation   and increased filling pressure (grade 2 diastolic dysfunction). - Aortic valve: There was trivial regurgitation. - Left atrium: The atrium was moderately dilated. - Tricuspid valve: There was moderate regurgitation. - Pericardium, extracardiac: A small pericardial effusion was   identified circumferential to the heart. There was no evidence of   hemodynamic compromise  Patient Profile     Angel [name] is a 46 y.o. male with PMH of ESRD on HD, HTN, dCHF, h/o CVA and seizure, AAA repair, who is being seen today for the evaluation of chest pain. He presented to ED with dyspnea and was found to be in hypertensive emergency due to missing HD; he was also found to have troponin elevation to 4 and new systolic CHF with EF 10-93%. He is being evaluated for permanent HD access and may require cardiac cath during this admission.  Assessment & Plan    NSTEMI New onset systolic CHF (EF 23-55%): With new systolic CHF, would opt for cardiac cath during this admission; he does have known aortic atherosclerosis so likely has CAD as well. Trops 0.4>4.3>0.4. His hgb is up to 8.6 post transfusion --heparin gtt --asa 81mg  daily, lipitor 40mg  daily, coreg 25mg  BID, lisinopril 40mg  daily --cardiac cath on readmission  Hypertensive emergency HTN: With above from missing HD - hypertensive emergency is resolved. Nephrology is following and will do daily HD (3/3 session is this AM). BP significantly improved though still consistently elevated. --continue coreg 25mg  BID, lisinopril 40mg  daily, clonidine 0.2mg  TID; could probably add back his home  hydral 100mg  TID dosing.  ESRD on HD Symptomatic uremia: Symptomatic uremia is resolved. BUN trending down with HD. Will get daily HD back to back with today being last day; CLIP process started and VVS consulted by nephro for permanent access and he is scheduled for procedure at 5pm today vs coming back next week.  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 S/p IV iron and aranesp on 11/19.  For questions or updates, please contact [place] Please consult [email or web address] for contact info under Cardiology/STEMI.    Signed, Alphonzo Grieve, MD  06/27/2017, 7:47 AM  I have personally seen and examined this patient with Dr. Jari Favre. I agree with the assessment and plan as outlined above. Pt admitted with HTN emergency and elevated troponin along with chest pain. I think the troponin trend is consistent at 0.4. (The 4.35 is an outlier as the trend was 0.4, 4.35, 0.4, 0.4 and this would not be possible in a patient with ESRD). New LV systolic dysfunction with LVEF=20-25%. I have recommended a cardiac cath to exclude CAD but he is refusing. He is demanding to leave today. Will continue beta blocker and Ace-inh, ASA and statin. We will contact him to arrange f/u in our office.  He is leaving against our advice to pursue invasive cardiac workup.   Lauree Chandler 06/27/2017 11:41 AM

## 2017-06-27 NOTE — Progress Notes (Signed)
ANTICOAGULATION CONSULT NOTE - Follow Up Consult  Pharmacy Consult for Heparin Indication: chest pain/ACS  Labs: Recent Labs    06/25/17 0127  06/25/17 0321 06/25/17 0924 06/25/17 1413  06/26/17 0204 06/26/17 1120 06/26/17 2124 06/27/17 0324 06/27/17 1219  HGB  --   --  7.0*  --   --   --  6.7*  --   --  8.6*  --   HCT  --   --  20.7*  21.5*  --   --   --  19.6*  --   --  25.1*  --   PLT  --   --  207  --   --   --  179  --   --  201  --   LABPROT  --   --   --   --   --   --   --   --   --  15.2  --   INR  --   --   --   --   --   --   --   --   --  1.21  --   HEPARINUNFRC  --   --   --   --   --    < > <0.10* <0.10* 0.17* 0.22* 0.53  CREATININE  --   --  23.78*  --   --   --  16.97*  --   --  11.29*  --   CKTOTAL 603*  --   --   --   --   --   --   --   --   --   --   CKMB 11.6*  --   --   --   --   --   --   --   --   --   --   TROPONINI  --    < > 0.47* 0.41* 4.35*  --   --  0.38*  --   --   --    < > = values in this interval not displayed.    Assessment: 46yo male on IV heparin for ACS. HL this afternoon is therapeutic at 0.53 on heparin 1550 units/hr. H/H improved after transfusion. Plt wnl. No overt s/s of bleeding per RN.   Goal of Therapy:  Heparin level 0.3-0.7 units/ml   Plan:  -Continue heparin at 1550 units/hr -2000 HL  -Per cards, patient may need cath later this week when H/H is stable   Albertina Parr, PharmD., BCPS Clinical Pharmacist Pager [phone]

## 2017-06-27 NOTE — Progress Notes (Addendum)
[facility] Rounding Note Subjective/Interval History:  Getting 3rd sequential treatment today Numbers looking much better Should be able to resume full treatment schedule next time (Friday) Transfused yesterday  VVS has seen - no perm access until cards clearance from NSTEMI (may need heart cath) Pt tells me that cards is clearing him for access but nothing on the chart Also tells me that "he has to leave the hospital" We have no outpt spot for him yet though...  Objective Vital signs in last 24 hours: Vitals:   06/27/17 0727 06/27/17 0735 06/27/17 0805 06/27/17 0835  BP: (!) 165/110 (!) 167/112 (!) 157/104 (!) 158/105  Pulse: 78 77 75 84  Resp: 17 18 16 17   Temp:      TempSrc:      SpO2:  98% 99%   Weight:      Height:       Weight change: 0.1 kg (3.5 oz)  Intake/Output Summary (Last 24 hours) at 06/27/2017 1050 Last data filed at 06/27/2017 0400 Gross per 24 hour  Intake 1446.36 ml  Output 4000 ml  Net -2553.64 ml   Physical Exam:  Blood pressure (!) 158/105, pulse 84, temperature 97.6 F (36.4 C), temperature source Oral, resp. rate 17, height 5\' 5"  (1.651 m), weight 62.7 kg (138 lb 3.7 oz), SpO2 99 %.  Gen:  Lines/tubes: R TDC Skin: no rash, cyanosis Neck: no JVD Chest: Clear Heart: Regular S1S2 No S3, + S4 Abdomen: soft, not tender Ext: 1+ edema pitting LEs Neuro: Cannot fully assess Dialysis Access: [facility] R IJ site not tender   Recent Labs  Lab 06/24/17 1953 06/25/17 0321 06/26/17 0204 06/27/17 0324  NA 141 143 140 136  K 5.2* 5.2* 4.5 3.9  CL 102 103 101 99*  CO2 16* 18* 22 24  GLUCOSE 112* 125* 93 112*  BUN 203* 209* 132* 75*  CREATININE 22.62* 23.78* 16.97* 11.29*  CALCIUM 9.8 9.7 9.5 8.8*  PHOS  --   --  8.0* 7.0*    Recent Labs  Lab 06/25/17 0321 06/26/17 0204 06/27/17 0324  AST 24  --   --   ALT 147*  --   --   ALKPHOS 90  --   --   BILITOT 0.8  --   --   PROT 6.3*  --   --   ALBUMIN 3.2* 2.9* 2.5*    Recent Labs  Lab 06/24/17 1953  06/25/17 0321 06/26/17 0204 06/27/17 0324  WBC 7.3 6.9 6.1 8.4  HGB 7.5* 7.0* 6.7* 8.6*  HCT 22.2* 20.7*  21.5* 19.6* 25.1*  MCV 83.8 84.1 83.1 83.1  PLT 245 207 179 201    Recent Labs  Lab 06/25/17 0127 06/25/17 0321 06/25/17 0924 06/25/17 1413 06/26/17 1120  CKTOTAL 603*  --   --   --   --   CKMB 11.6*  --   --   --   --   TROPONINI  --  0.47* 0.41* 4.35* 0.38*    Component Value Date/Time   IRON 30 (L) 06/25/2017 0127   TIBC 241 (L) 06/25/2017 0127   FERRITIN 1,472 (H) 06/25/2017 0127   IRONPCTSAT 12 (L)    Medications: . cefUROXime (ZINACEF)  IV    . [START ON 06/29/2017] ferric gluconate (FERRLECIT/NULECIT) IV    . heparin 1,550 Units/hr (06/27/17 0425)   . aspirin EC  81 mg Oral Daily  . atorvastatin  40 mg Oral QHS  . calcitRIOL  0.75 mcg Oral Q M,W,F-1800  . carvedilol  25 mg Oral BID WC  . cloNIDine  0.2 mg Oral TID  . darbepoetin (ARANESP) injection - DIALYSIS  200 mcg Intravenous Q Mon-HD  . lisinopril  40 mg Oral Daily  . sevelamer carbonate  1,600 mg Oral TID WC   Dialysis prescription: TTS F180, 3.5 hours, 400/800, EDW 56.5 kg 3251 bath Heparin 2000 bolus Calcitriol 0.75 Aranesp 200/week Hb 11/8 was 7 10/18 Ca 9.5, phos 4.8 04/2017 PTH 684 (Last HD PTA was 06/14/17)  Background: 46 y.o. year-old AAM, PMH HTN, AAA repair ([facility]), ESRD HD since 08/2016, most recently [place] [facility] [address] (per their staff has been non-compliant) who moved to [facility] with no HD arrangements made. Admitted 11/14 with severe HTN, signed out AMA before renal could see. Presented again 11/19 uremic, very hypertensive. Last HD PTA 06/14/17. Has only [facility] for access.  Assessment/Recommendations  1. ESRD - on dialysis since 08/2016 in [place]. Last HD PTA was 06/14/17. BUN >200. Proceeded as if dialysis naive to avoid disequilibrium  - short daily and progress back to usual time - should be OK for regular TMT prescription next TMT. Feels great now. Started CLIP  for HD in [place]. No outpt unit assignment yet 2. Dialysis access - Agrees to perm access. Needs cards clearance - tells me he has that. Nothing on the record yet. .  3. HTN - severe. Oral meds per primary svce. 4. NSTEMI - related to [facility] emergency. Will need heart cath at some point. 5. Anemia - resumed Aranesp 11/19, started IV iron with HD 6. Secondary HPT - [place] 8. Start non-ca based binder ([place]) Resumed calcitriol with HD 7. Disposition - HOPEFUL for spot at [facility]. Hope can get cards clearance for access this week.   Merric Maes, MD [facility] [phone] pager 06/27/2017, 10:50 AM

## 2017-06-27 NOTE — Clinical Social Work Note (Signed)
Clinical Social Work Assessment  Patient Details  Name: Angel [name] MRN: [id number] Date of Birth: 05/06/1971  Date of referral:  06/27/17               Reason for consult:  Housing Concerns/Homelessness                Permission sought to share information with:  Other(Housing Authority Staff: Ms. Morrison) Permission granted to share information::  Yes, Verbal Permission Granted  Name::     Ms. Morrison  Agency::     Relationship::  [place] [employer] Staff Member  Contact Information:  [phone]  Housing/Transportation Living arrangements for the past 2 months:  Single Family Home Source of Information:  Patient, Medical Team, Other (Comment Required)([place] [employer] Staff Member) Patient Interpreter Needed:  None Criminal Activity/Legal Involvement Pertinent to Current Situation/Hospitalization:  No - Comment as needed Significant Relationships:  Dependent Children, Parents Lives with:  Minor Children Do you feel safe going back to the place where you live?  Yes Need for family participation in patient care:  Yes (Comment)  Care giving concerns:  Patient has a meeting with [place] [employer] at 1:30 to sign lease for his new apartment.   Social Worker assessment / plan:  CSW met with patient in HD. CSW introduced role and inquired about needs related to signing his lease. Patient stated that he has an appointment with Ms. Morrison at the [place] [employer] at 1:30 pm today to sign the lease for his new apartment. The patient states he has tried asking for another appointment date, esigning the lease, or having his mother bring it to the hospital for him to sign. The patient has two children that are 9 and 10 years old and they are moving here from [place]. Patient gave CSW permission to call Ms. Morrison and try to work something out because per MD notes, patient is not stable for discharge yet. Patient states he would not  discharge until Monday at the earliest. CSW called and spoke with Ms. Morrison. She states she cannot fax CSW the lease but can extend the signing date to next Wednesday 11/28 at 10:00 am due to not knowing for sure when he will discharge. CSW paged MD to find out anticipated discharge date. Ms.  Morrison just asked that patient call her whenever he gets a chance. Patient notified of conversation. No further concerns. CSW signing off as social work intervention is no longer needed.  Employment status:  Disabled (Comment on whether or not currently receiving Disability) Insurance information:  Medicare PT Recommendations:  Not assessed at this time Information / Referral to community resources:  Other (Comment Required)([place] [employer])  Patient/Family's Response to care:  Patient agreeable to CSW assisting with housing issues. Patient's mother and children supportive and involved in patient's care. Patient appreciated social work intervention.  Patient/Family's Understanding of and Emotional Response to Diagnosis, Current Treatment, and Prognosis:  Patient has a good understanding of the reason for admission and social work consult. Patient appears happy with hospital care.  Emotional Assessment Appearance:  Appears stated age Attitude/Demeanor/Rapport:  Other(Pleasant) Affect (typically observed):  Accepting, Appropriate, Calm, Pleasant Orientation:  Oriented to Self, Oriented to Place, Oriented to  Time, Oriented to Situation Alcohol / Substance use:  Never Used Psych involvement (Current and /or in the community):  No (Comment)  Discharge Needs  Concerns to be addressed:  No discharge needs identified Readmission within the last 30 days:  No Current discharge risk:    None Barriers to Discharge:  Continued Medical Work up   Candie Chroman, LCSW 06/27/2017, 10:28 AM

## 2017-06-27 NOTE — Progress Notes (Signed)
MD notified about pt's concerns &desire to leave AMA. Social work talked with pt about lease pt was worried about getting signed (social work scheduled new appointment time) but pt still wanted to leave. Signed off on AMA paperwork. IV removed. Pt sent with personal items. Pt's ride here.   [place]  [place] Czerwinski, RN

## 2017-07-07 DIAGNOSIS — I214 Non-ST elevation (NSTEMI) myocardial infarction: Secondary | ICD-10-CM

## 2017-07-07 HISTORY — DX: Non-ST elevation (NSTEMI) myocardial infarction: I21.4

## 2017-07-12 ENCOUNTER — Other Ambulatory Visit: Payer: Self-pay

## 2017-07-12 DIAGNOSIS — D631 Anemia in chronic kidney disease: Secondary | ICD-10-CM | POA: Diagnosis present

## 2017-07-12 DIAGNOSIS — I132 Hypertensive heart and chronic kidney disease with heart failure and with stage 5 chronic kidney disease, or end stage renal disease: Secondary | ICD-10-CM | POA: Diagnosis not present

## 2017-07-12 DIAGNOSIS — M898X9 Other specified disorders of bone, unspecified site: Secondary | ICD-10-CM | POA: Diagnosis present

## 2017-07-12 DIAGNOSIS — Z841 Family history of disorders of kidney and ureter: Secondary | ICD-10-CM

## 2017-07-12 DIAGNOSIS — Z888 Allergy status to other drugs, medicaments and biological substances status: Secondary | ICD-10-CM

## 2017-07-12 DIAGNOSIS — Z9115 Patient's noncompliance with renal dialysis: Secondary | ICD-10-CM

## 2017-07-12 DIAGNOSIS — G40909 Epilepsy, unspecified, not intractable, without status epilepticus: Secondary | ICD-10-CM | POA: Diagnosis present

## 2017-07-12 DIAGNOSIS — I2510 Atherosclerotic heart disease of native coronary artery without angina pectoris: Secondary | ICD-10-CM | POA: Diagnosis present

## 2017-07-12 DIAGNOSIS — Z8673 Personal history of transient ischemic attack (TIA), and cerebral infarction without residual deficits: Secondary | ICD-10-CM

## 2017-07-12 DIAGNOSIS — R569 Unspecified convulsions: Secondary | ICD-10-CM | POA: Diagnosis present

## 2017-07-12 DIAGNOSIS — I5043 Acute on chronic combined systolic (congestive) and diastolic (congestive) heart failure: Secondary | ICD-10-CM | POA: Diagnosis present

## 2017-07-12 DIAGNOSIS — I161 Hypertensive emergency: Secondary | ICD-10-CM | POA: Diagnosis present

## 2017-07-12 DIAGNOSIS — Z825 Family history of asthma and other chronic lower respiratory diseases: Secondary | ICD-10-CM

## 2017-07-12 DIAGNOSIS — N186 End stage renal disease: Secondary | ICD-10-CM | POA: Diagnosis not present

## 2017-07-12 DIAGNOSIS — E785 Hyperlipidemia, unspecified: Secondary | ICD-10-CM | POA: Diagnosis present

## 2017-07-12 DIAGNOSIS — K219 Gastro-esophageal reflux disease without esophagitis: Secondary | ICD-10-CM | POA: Diagnosis present

## 2017-07-12 DIAGNOSIS — N2581 Secondary hyperparathyroidism of renal origin: Secondary | ICD-10-CM | POA: Diagnosis present

## 2017-07-12 DIAGNOSIS — E875 Hyperkalemia: Secondary | ICD-10-CM | POA: Diagnosis present

## 2017-07-12 DIAGNOSIS — Z9119 Patient's noncompliance with other medical treatment and regimen: Secondary | ICD-10-CM

## 2017-07-12 DIAGNOSIS — Z7901 Long term (current) use of anticoagulants: Secondary | ICD-10-CM

## 2017-07-12 DIAGNOSIS — Z8249 Family history of ischemic heart disease and other diseases of the circulatory system: Secondary | ICD-10-CM

## 2017-07-12 DIAGNOSIS — Z7982 Long term (current) use of aspirin: Secondary | ICD-10-CM

## 2017-07-12 DIAGNOSIS — Z992 Dependence on renal dialysis: Secondary | ICD-10-CM

## 2017-07-13 ENCOUNTER — Other Ambulatory Visit: Payer: Self-pay

## 2017-07-13 ENCOUNTER — Encounter (HOSPITAL_COMMUNITY): Payer: Self-pay | Admitting: *Deleted

## 2017-07-13 ENCOUNTER — Inpatient Hospital Stay (HOSPITAL_COMMUNITY)
Admission: EM | Admit: 2017-07-13 | Discharge: 2017-07-16 | DRG: 291 | Discharge status: Against Medical Advice | Payer: Medicare Other | Attending: Internal Medicine | Admitting: Internal Medicine

## 2017-07-13 ENCOUNTER — Emergency Department (HOSPITAL_COMMUNITY): Payer: Medicare Other

## 2017-07-13 DIAGNOSIS — Z7901 Long term (current) use of anticoagulants: Secondary | ICD-10-CM | POA: Diagnosis not present

## 2017-07-13 DIAGNOSIS — Z9115 Patient's noncompliance with renal dialysis: Secondary | ICD-10-CM | POA: Diagnosis not present

## 2017-07-13 DIAGNOSIS — Z8249 Family history of ischemic heart disease and other diseases of the circulatory system: Secondary | ICD-10-CM | POA: Diagnosis not present

## 2017-07-13 DIAGNOSIS — I10 Essential (primary) hypertension: Secondary | ICD-10-CM | POA: Diagnosis present

## 2017-07-13 DIAGNOSIS — N2581 Secondary hyperparathyroidism of renal origin: Secondary | ICD-10-CM | POA: Diagnosis present

## 2017-07-13 DIAGNOSIS — I5043 Acute on chronic combined systolic (congestive) and diastolic (congestive) heart failure: Secondary | ICD-10-CM | POA: Diagnosis present

## 2017-07-13 DIAGNOSIS — Z9119 Patient's noncompliance with other medical treatment and regimen: Secondary | ICD-10-CM | POA: Diagnosis not present

## 2017-07-13 DIAGNOSIS — D631 Anemia in chronic kidney disease: Secondary | ICD-10-CM | POA: Diagnosis present

## 2017-07-13 DIAGNOSIS — Z8673 Personal history of transient ischemic attack (TIA), and cerebral infarction without residual deficits: Secondary | ICD-10-CM | POA: Diagnosis not present

## 2017-07-13 DIAGNOSIS — I2510 Atherosclerotic heart disease of native coronary artery without angina pectoris: Secondary | ICD-10-CM | POA: Diagnosis present

## 2017-07-13 DIAGNOSIS — K219 Gastro-esophageal reflux disease without esophagitis: Secondary | ICD-10-CM | POA: Diagnosis present

## 2017-07-13 DIAGNOSIS — I161 Hypertensive emergency: Secondary | ICD-10-CM

## 2017-07-13 DIAGNOSIS — Z992 Dependence on renal dialysis: Secondary | ICD-10-CM

## 2017-07-13 DIAGNOSIS — I509 Heart failure, unspecified: Secondary | ICD-10-CM | POA: Diagnosis not present

## 2017-07-13 DIAGNOSIS — N186 End stage renal disease: Secondary | ICD-10-CM

## 2017-07-13 DIAGNOSIS — E785 Hyperlipidemia, unspecified: Secondary | ICD-10-CM | POA: Diagnosis present

## 2017-07-13 DIAGNOSIS — R569 Unspecified convulsions: Secondary | ICD-10-CM | POA: Diagnosis present

## 2017-07-13 DIAGNOSIS — I132 Hypertensive heart and chronic kidney disease with heart failure and with stage 5 chronic kidney disease, or end stage renal disease: Secondary | ICD-10-CM | POA: Diagnosis present

## 2017-07-13 DIAGNOSIS — E875 Hyperkalemia: Secondary | ICD-10-CM | POA: Diagnosis present

## 2017-07-13 DIAGNOSIS — Z7982 Long term (current) use of aspirin: Secondary | ICD-10-CM | POA: Diagnosis not present

## 2017-07-13 DIAGNOSIS — Z841 Family history of disorders of kidney and ureter: Secondary | ICD-10-CM | POA: Diagnosis not present

## 2017-07-13 DIAGNOSIS — R7989 Other specified abnormal findings of blood chemistry: Secondary | ICD-10-CM

## 2017-07-13 DIAGNOSIS — M898X9 Other specified disorders of bone, unspecified site: Secondary | ICD-10-CM | POA: Diagnosis present

## 2017-07-13 DIAGNOSIS — G40909 Epilepsy, unspecified, not intractable, without status epilepticus: Secondary | ICD-10-CM | POA: Diagnosis present

## 2017-07-13 DIAGNOSIS — Z825 Family history of asthma and other chronic lower respiratory diseases: Secondary | ICD-10-CM | POA: Diagnosis not present

## 2017-07-13 DIAGNOSIS — Z888 Allergy status to other drugs, medicaments and biological substances status: Secondary | ICD-10-CM | POA: Diagnosis not present

## 2017-07-13 DIAGNOSIS — R778 Other specified abnormalities of plasma proteins: Secondary | ICD-10-CM | POA: Diagnosis present

## 2017-07-13 LAB — CBC
HCT: 27.6 % — ABNORMAL LOW (ref 39.0–52.0)
HEMATOCRIT: 26 % — AB (ref 39.0–52.0)
HEMOGLOBIN: 9.5 g/dL — AB (ref 13.0–17.0)
Hemoglobin: 8.8 g/dL — ABNORMAL LOW (ref 13.0–17.0)
MCH: 27.9 pg (ref 26.0–34.0)
MCH: 28.3 pg (ref 26.0–34.0)
MCHC: 33.8 g/dL (ref 30.0–36.0)
MCHC: 34.4 g/dL (ref 30.0–36.0)
MCV: 81.2 fL (ref 78.0–100.0)
MCV: 83.6 fL (ref 78.0–100.0)
PLATELETS: 134 10*3/uL — AB (ref 150–400)
PLATELETS: 154 10*3/uL (ref 150–400)
RBC: 3.11 MIL/uL — ABNORMAL LOW (ref 4.22–5.81)
RBC: 3.4 MIL/uL — AB (ref 4.22–5.81)
RDW: 16.2 % — ABNORMAL HIGH (ref 11.5–15.5)
RDW: 16.7 % — AB (ref 11.5–15.5)
WBC: 8.1 10*3/uL (ref 4.0–10.5)
WBC: 9.1 10*3/uL (ref 4.0–10.5)

## 2017-07-13 LAB — I-STAT CHEM 8, ED
CALCIUM ION: 0.98 mmol/L — AB (ref 1.15–1.40)
CHLORIDE: 113 mmol/L — AB (ref 101–111)
Creatinine, Ser: >18 mg/dL — ABNORMAL HIGH (ref 0.61–1.24)
GLUCOSE: 126 mg/dL — AB (ref 65–99)
HCT: 26 % — ABNORMAL LOW (ref 39.0–52.0)
Hemoglobin: 8.8 g/dL — ABNORMAL LOW (ref 13.0–17.0)
POTASSIUM: 5.1 mmol/L (ref 3.5–5.1)
Sodium: 142 mmol/L (ref 135–145)
TCO2: 13 mmol/L — ABNORMAL LOW (ref 22–32)

## 2017-07-13 LAB — BASIC METABOLIC PANEL
ANION GAP: 23 — AB (ref 5–15)
BUN: 194 mg/dL — ABNORMAL HIGH (ref 6–20)
CALCIUM: 8.8 mg/dL — AB (ref 8.9–10.3)
CO2: 13 mmol/L — ABNORMAL LOW (ref 22–32)
Chloride: 105 mmol/L (ref 101–111)
Creatinine, Ser: 26.47 mg/dL — ABNORMAL HIGH (ref 0.61–1.24)
GFR, EST AFRICAN AMERICAN: 2 mL/min — AB (ref >60)
GFR, EST NON AFRICAN AMERICAN: 2 mL/min — AB (ref >60)
GLUCOSE: 118 mg/dL — AB (ref 65–99)
POTASSIUM: 5 mmol/L (ref 3.5–5.1)
SODIUM: 141 mmol/L (ref 135–145)

## 2017-07-13 LAB — COMPREHENSIVE METABOLIC PANEL
ALT: 42 U/L (ref 17–63)
ANION GAP: 19 — AB (ref 5–15)
AST: 26 U/L (ref 15–41)
Albumin: 3.1 g/dL — ABNORMAL LOW (ref 3.5–5.0)
Alkaline Phosphatase: 114 U/L (ref 38–126)
BUN: 111 mg/dL — ABNORMAL HIGH (ref 6–20)
CHLORIDE: 100 mmol/L — AB (ref 101–111)
CO2: 20 mmol/L — AB (ref 22–32)
Calcium: 8.8 mg/dL — ABNORMAL LOW (ref 8.9–10.3)
Creatinine, Ser: 16.6 mg/dL — ABNORMAL HIGH (ref 0.61–1.24)
GFR, EST AFRICAN AMERICAN: 3 mL/min — AB (ref >60)
GFR, EST NON AFRICAN AMERICAN: 3 mL/min — AB (ref >60)
Glucose, Bld: 90 mg/dL (ref 65–99)
Potassium: 3.8 mmol/L (ref 3.5–5.1)
SODIUM: 139 mmol/L (ref 135–145)
Total Bilirubin: 1 mg/dL (ref 0.3–1.2)
Total Protein: 6.6 g/dL (ref 6.5–8.1)

## 2017-07-13 LAB — I-STAT TROPONIN, ED: Troponin i, poc: 0.39 ng/mL (ref 0.00–0.08)

## 2017-07-13 MED ORDER — ALTEPLASE 2 MG IJ SOLR
2.0000 mg | Freq: Once | INTRAMUSCULAR | Status: DC | PRN
  Filled 2017-07-13: qty 2

## 2017-07-13 MED ORDER — ASPIRIN EC 81 MG PO TBEC
81.0000 mg | DELAYED_RELEASE_TABLET | Freq: Every day | ORAL | Status: DC
  Administered 2017-07-13 – 2017-07-16 (×4): 81 mg via ORAL
  Filled 2017-07-13 (×4): qty 1

## 2017-07-13 MED ORDER — LIDOCAINE HCL (PF) 1 % IJ SOLN: 5.0000 mL | INTRAMUSCULAR | Status: DC | PRN

## 2017-07-13 MED ORDER — HYDRALAZINE HCL 20 MG/ML IJ SOLN
10.0000 mg | Freq: Once | INTRAMUSCULAR | Status: AC
  Administered 2017-07-13: 10 mg via INTRAVENOUS
  Filled 2017-07-13: qty 1

## 2017-07-13 MED ORDER — SODIUM CHLORIDE 0.9 % IV SOLN: 100.0000 mL | INTRAVENOUS | Status: DC | PRN

## 2017-07-13 MED ORDER — SODIUM CHLORIDE 0.9% FLUSH
3.0000 mL | Freq: Two times a day (BID) | INTRAVENOUS | Status: DC
  Administered 2017-07-13 – 2017-07-16 (×6): 3 mL via INTRAVENOUS

## 2017-07-13 MED ORDER — ACETAMINOPHEN 325 MG PO TABS
650.0000 mg | ORAL_TABLET | Freq: Four times a day (QID) | ORAL | Status: DC | PRN
  Administered 2017-07-14: 650 mg via ORAL
  Filled 2017-07-13: qty 2

## 2017-07-13 MED ORDER — NITROGLYCERIN IN D5W 200-5 MCG/ML-% IV SOLN: 0.0000 ug/min | INTRAVENOUS | Status: DC

## 2017-07-13 MED ORDER — ATORVASTATIN CALCIUM 40 MG PO TABS
40.0000 mg | ORAL_TABLET | Freq: Every day | ORAL | Status: DC
  Administered 2017-07-13 – 2017-07-15 (×3): 40 mg via ORAL
  Filled 2017-07-13 (×3): qty 1

## 2017-07-13 MED ORDER — LIDOCAINE-PRILOCAINE 2.5-2.5 % EX CREA
1.0000 "application " | TOPICAL_CREAM | CUTANEOUS | Status: DC | PRN
  Filled 2017-07-13: qty 5

## 2017-07-13 MED ORDER — HEPARIN SODIUM (PORCINE) 1000 UNIT/ML DIALYSIS
40.0000 [IU]/kg | INTRAMUSCULAR | Status: DC | PRN
  Filled 2017-07-13: qty 3

## 2017-07-13 MED ORDER — NITROGLYCERIN IN D5W 200-5 MCG/ML-% IV SOLN
0.0000 ug/min | Freq: Once | INTRAVENOUS | Status: AC
  Administered 2017-07-13: 5 ug/min via INTRAVENOUS
  Filled 2017-07-13: qty 250

## 2017-07-13 MED ORDER — ONDANSETRON HCL 4 MG/2ML IJ SOLN
4.0000 mg | Freq: Four times a day (QID) | INTRAMUSCULAR | Status: DC | PRN
  Administered 2017-07-13: 4 mg via INTRAVENOUS
  Filled 2017-07-13: qty 2

## 2017-07-13 MED ORDER — ACETAMINOPHEN 500 MG PO TABS
1000.0000 mg | ORAL_TABLET | Freq: Once | ORAL | Status: AC
  Administered 2017-07-13: 1000 mg via ORAL
  Filled 2017-07-13: qty 2

## 2017-07-13 MED ORDER — HEPARIN SODIUM (PORCINE) 5000 UNIT/ML IJ SOLN
5000.0000 [IU] | Freq: Three times a day (TID) | INTRAMUSCULAR | Status: DC
  Filled 2017-07-13 (×4): qty 1

## 2017-07-13 MED ORDER — SODIUM CHLORIDE 0.9% FLUSH: 3.0000 mL | INTRAVENOUS | Status: DC | PRN

## 2017-07-13 MED ORDER — LISINOPRIL 40 MG PO TABS
40.0000 mg | ORAL_TABLET | Freq: Every day | ORAL | Status: DC
  Administered 2017-07-13 – 2017-07-16 (×5): 40 mg via ORAL
  Filled 2017-07-13: qty 1
  Filled 2017-07-13: qty 2
  Filled 2017-07-13 (×3): qty 1

## 2017-07-13 MED ORDER — CARVEDILOL 25 MG PO TABS
25.0000 mg | ORAL_TABLET | Freq: Two times a day (BID) | ORAL | Status: DC
  Administered 2017-07-13 – 2017-07-16 (×6): 25 mg via ORAL
  Filled 2017-07-13 (×7): qty 1

## 2017-07-13 MED ORDER — PENTAFLUOROPROP-TETRAFLUOROETH EX AERO
1.0000 "application " | INHALATION_SPRAY | CUTANEOUS | Status: DC | PRN
  Filled 2017-07-13: qty 30

## 2017-07-13 MED ORDER — HYDRALAZINE HCL 50 MG PO TABS
100.0000 mg | ORAL_TABLET | Freq: Every day | ORAL | Status: DC
  Administered 2017-07-13: 100 mg via ORAL
  Filled 2017-07-13: qty 2

## 2017-07-13 MED ORDER — CLONIDINE HCL 0.1 MG PO TABS
0.1000 mg | ORAL_TABLET | ORAL | Status: AC
  Administered 2017-07-13: 0.1 mg via ORAL
  Filled 2017-07-13: qty 1

## 2017-07-13 MED ORDER — HEPARIN SODIUM (PORCINE) 1000 UNIT/ML DIALYSIS
1000.0000 [IU] | INTRAMUSCULAR | Status: DC | PRN
  Filled 2017-07-13: qty 1

## 2017-07-13 MED ORDER — SODIUM CHLORIDE 0.9 % IV SOLN: 250.0000 mL | INTRAVENOUS | Status: DC | PRN

## 2017-07-13 MED ORDER — FUROSEMIDE 80 MG PO TABS
80.0000 mg | ORAL_TABLET | Freq: Every day | ORAL | Status: DC
  Administered 2017-07-13: 80 mg via ORAL
  Filled 2017-07-13: qty 1

## 2017-07-13 MED ORDER — CLONIDINE HCL 0.2 MG PO TABS
0.2000 mg | ORAL_TABLET | Freq: Two times a day (BID) | ORAL | Status: DC
  Administered 2017-07-13 (×2): 0.2 mg via ORAL
  Filled 2017-07-13 (×2): qty 1

## 2017-07-13 MED ORDER — HYDRALAZINE HCL 20 MG/ML IJ SOLN
10.0000 mg | Freq: Four times a day (QID) | INTRAMUSCULAR | Status: DC | PRN
  Administered 2017-07-13 (×2): 10 mg via INTRAVENOUS
  Filled 2017-07-13: qty 0.5
  Filled 2017-07-13 (×2): qty 1

## 2017-07-13 NOTE — ED Notes (Signed)
Pt up to restroom.

## 2017-07-13 NOTE — Consult Note (Signed)
Reason for Consult: Continuity of ESRD care, malignant hypertension Referring Physician: [name] M.D. [facility])   HPI:  46 year old African-American man with past medical history significant for hypertension, FSGS, end-stage renal disease on hemodialysis, history of CVA, history of AAA repair, history of seizures and congestive heart failure who recently left the hospital [place] about 2 weeks ago. At that time, he presented to the emergency room in need of dialysis after relocating from the [place] area without any definitive arrangements for hemodialysis.  He reports to have been called earlier in the week by the dialysis center with confirmation of having a chair there to begin dialysis however  "I did not go because I did not know where the kidney center was". Presented to the emergency room last night with increasing shortness of breath and feeling poorly/in need of hemodialysis after 2 weeks of going without dialysis.  Denies any fever or chills and does not have any cough/sputum production. Denies any nausea, vomiting or diarrhea. Does not have urine output.  Hemodialysis prescription: Assigned to [facility] kidney center, TTS. Yet to start.   Past Medical History:  Diagnosis Date  . CHF (congestive heart failure) ([place])   . ESRD on dialysis ([place])   . FSGS (focal segmental glomerulosclerosis)   . GERD (gastroesophageal reflux disease)   . HLD (hyperlipidemia)   . Hypertension   . Renal disorder   . Seizure ([place])   . Stroke (cerebrum) [facility])     Past Surgical History:  Procedure Laterality Date  . ABDOMINAL AORTIC ANEURYSM REPAIR    . KIDNEY SURGERY      Family History  Problem Relation Age of Onset  . Hypertension Mother   . Kidney disease Mother   . Asthma Father   . Hypertension Father   . Kidney disease Father   . Hypertension Sister   . Heart disease Sister   . Kidney disease Sister   . Heart disease Brother   . Hypertension Brother   .  COPD Brother     Social History:  reports that  has never smoked. he has never used smokeless tobacco. He reports that he does not drink alcohol or use drugs.  Allergies:  Allergies  Allergen Reactions  . Hydralazine Hcl Swelling    Leg swelling     Medications:  Scheduled: . carvedilol  25 mg Oral BID WC  . cloNIDine  0.2 mg Oral BID  . hydrALAZINE  100 mg Oral Daily  . lisinopril  40 mg Oral Daily    BMP Latest Ref Rng & Units 07/13/2017 06/27/2017 06/26/2017  Glucose 65 - 99 mg/dL 118(H) 112(H) 93  BUN 6 - 20 mg/dL 194(H) 75(H) 132(H)  Creatinine 0.61 - 1.24 mg/dL 26.47(H) 11.29(H) 16.97(H)  Sodium 135 - 145 mmol/L 141 136 140  Potassium 3.5 - 5.1 mmol/L 5.0 3.9 4.5  Chloride 101 - 111 mmol/L 105 99(L) 101  CO2 22 - 32 mmol/L 13(L) 24 22  Calcium 8.9 - 10.3 mg/dL 8.8(L) 8.8(L) 9.5   CBC Latest Ref Rng & Units 07/13/2017 06/27/2017 06/26/2017  WBC 4.0 - 10.5 K/uL 9.1 8.4 6.1  Hemoglobin 13.0 - 17.0 g/dL 8.8(L) 8.6(L) 6.7(LL)  Hematocrit 39.0 - 52.0 % 26.0(L) 25.1(L) 19.6(L)  Platelets 150 - 400 K/uL 134(L) 201 179     Dg Chest Portable 1 View  Result Date: 07/13/2017 CLINICAL DATA:  Bilateral breast and lower extremity swelling. EXAM: PORTABLE CHEST 1 VIEW COMPARISON:  06/24/2017 FINDINGS: Dialysis catheter terminates within the expected  location of right atrium. Cardiac silhouette is markedly enlarged.  Tortuosity of the aorta. There is no evidence of focal airspace consolidation, pleural effusion or pneumothorax. Osseous structures are without acute abnormality. Soft tissues are grossly normal. IMPRESSION: Markedly enlarged cardiac silhouette. No evidence of pulmonary edema or focal consolidation. Electronically Signed   By: Fidela Salisbury M.D.   On: 07/13/2017 01:27    Review of Systems  Constitutional: Positive for malaise/fatigue. Negative for chills and fever.  HENT: Negative.   Eyes: Negative.   Respiratory: Positive for shortness of breath. Negative for  cough and sputum production.   Cardiovascular: Positive for palpitations and leg swelling. Negative for chest pain and orthopnea.  Gastrointestinal: Positive for nausea. Negative for abdominal pain, diarrhea and vomiting.  Genitourinary: Negative.   Musculoskeletal: Negative.   Skin: Positive for itching. Negative for rash.  Neurological: Positive for dizziness, weakness and headaches. Negative for sensory change and speech change.   Blood pressure (!) 176/112, pulse 84, temperature 98.3 F (36.8 C), temperature source Oral, resp. rate 18, height 5\' 5"  (1.651 m), weight 71.6 kg (157 lb 13.6 oz), SpO2 97 %. Physical Exam  Nursing note and vitals reviewed. Constitutional: He is oriented to person, place, and time. He appears well-developed and well-nourished. No distress.  HENT:  Head: Normocephalic and atraumatic.  Mouth/Throat: Oropharynx is clear and moist.  Eyes: Conjunctivae are normal. Pupils are equal, round, and reactive to light.  Neck: Normal range of motion. Neck supple. JVD present.  12 cm JVD  Cardiovascular: Normal rate, regular rhythm and normal heart sounds.  No murmur heard. Respiratory: Effort normal. He has wheezes. He has rales.  Expiratory wheezes audible bilaterally and right IJ tunneled hemodialysis catheter.   GI: Soft. Bowel sounds are normal. There is no tenderness. There is no rebound.  Musculoskeletal: Normal range of motion. He exhibits edema.  3+ lower extremity edema  Neurological: He is alert and oriented to person, place, and time. No cranial nerve deficit.  Skin: Skin is warm and dry. No erythema.    Assessment/Plan: 1. Malignant hypertension: Likely secondary to lapses of adherence with anti-hypertensive therapy as well as missing hemodialysis/volume overload. Plan for hemodialysis today and restarting usual antihypertensive agents. 2. End-stage renal disease: Hemodialysis has been ordered for today-significant volume overload noted on physical exam.  Had a lengthy discussion with the patient regarding the importance of compliance and his role/ownership in making sure that he goes to his scheduled hemodialysis treatments rather than blaming missed dialysis on others. Recommend pursuing permanent access placement sooner than later. 3. Anemia of chronic kidney disease: Resume ESA with hemodialysis-denies overt loss. 4. Secondary hyperparathyroidism: Resume phosphorus binders with meals 3 times a day, reconcile medications for VDRA.   Angel [name] K. 07/13/2017, 7:29 AM

## 2017-07-13 NOTE — Progress Notes (Signed)
PROGRESS NOTE    Angel [name]  ZOX:[id number] DOB: 1971/02/17 DOA: 07/13/2017 PCP: Gwenlyn Saran, MD     Brief Narrative:  Angel [name] is 46 yo male with past medical hx of ESRD on HD, FSGS, HTN, previous stroke, seizure disorder, diastolic HF with recent admission for hypertensive emergency. He had left AMA at that time. He now presents to the ED with complaints of dyspnea and weight gain. He has missed dialysis for over a week. In the ED, he had uncontrolled BP. Nephrology was consulted for dialysis.   Assessment & Plan:   Principal Problem:   ESRD (end stage renal disease) ([place]) Active Problems:   Hyperkalemia   Anemia in ESRD (end-stage renal disease) ([name])   Elevated troponin   Hypertension   Malignant HTN  -Due to medical noncompliance -Resume coreg, catapres, lasix, hydralazine, lisinopril   ESRD on HD -Nephrology consulted, HD today   Acute on chronic combined systolic and diastolic heart failure -Echocardiogram with EF 81%, grade 2 diastolic dysfunction -Dialysis as above, Lasix  Elevated troponin -Chronically elevated, due to ESRD likely with malignant HTN. Previous troponin 0.38 last admission on 11/20, 0.39 this admission -Denies CP this morning  CAD -Continue aspirin, lipitor      DVT prophylaxis: subq hep Code Status: full Family Communication: no family at bedside Disposition Plan: pending improvement   Consultants:   Nephrology  Procedures:   None   Antimicrobials:  Anti-infectives (From admission, onward)   None       Subjective: Eating breakfast, no complaints. States he has swelling in bilateral lower extremities, breathing seems stable.  He denies chest pain.  Objective: Vitals:   07/13/17 1100 07/13/17 1130 07/13/17 1147 07/13/17 1337  BP: (!) 178/110 (!) 171/103 (!) 169/102 (!) 216/134  Pulse: 72 71 83 68  Resp: 18 18 17    Temp:   97.8 F (36.6 C) 98.1 F (36.7 C)  TempSrc:   Oral Oral  SpO2:   100% 100%   Weight:    68.6 kg (151 lb 4 oz)  Height:    5\' 5"  (1.651 m)    Intake/Output Summary (Last 24 hours) at 07/13/2017 1407 Last data filed at 07/13/2017 1147 Gross per 24 hour  Intake -  Output 2063 ml  Net -2063 ml   Filed Weights   07/13/17 0005 07/13/17 1337  Weight: 71.6 kg (157 lb 13.6 oz) 68.6 kg (151 lb 4 oz)    Examination:  General exam: Appears calm and comfortable  Respiratory system: Clear to auscultation. Respiratory effort normal. Cardiovascular system: S1 & S2 heard, RRR. No JVD, murmurs, rubs, gallops or clicks. +3 edema  Gastrointestinal system: Abdomen with some distension, soft and nontender. No organomegaly or masses felt. Normal bowel sounds heard. Central nervous system: Alert and oriented. No focal neurological deficits. Extremities: Symmetric, +edema Skin: No rashes, lesions or ulcers Psychiatry: Judgement and insight appear normal. Mood & affect appropriate.   Data Reviewed: I have personally reviewed following labs and imaging studies  CBC: Recent Labs  Lab 07/13/17 0007 07/13/17 0758  WBC 9.1  --   HGB 8.8* 8.8*  HCT 26.0* 26.0*  MCV 83.6  --   PLT 134*  --    Basic Metabolic Panel: Recent Labs  Lab 07/13/17 0007 07/13/17 0758  NA 141 142  K 5.0 5.1  CL 105 113*  CO2 13*  --   GLUCOSE 118* 126*  BUN 194* >140*  CREATININE 26.47* >18.00*  CALCIUM 8.8*  --  GFR: CrCl cannot be calculated (This lab value cannot be used to calculate CrCl because it is not a number: >18.00). Liver Function Tests: No results for input(s): AST, ALT, ALKPHOS, BILITOT, PROT, ALBUMIN in the last 168 hours. No results for input(s): LIPASE, AMYLASE in the last 168 hours. No results for input(s): AMMONIA in the last 168 hours. Coagulation Profile: No results for input(s): INR, PROTIME in the last 168 hours. Cardiac Enzymes: No results for input(s): CKTOTAL, CKMB, CKMBINDEX, TROPONINI in the last 168 hours. BNP (last 3 results) No results for input(s):  PROBNP in the last 8760 hours. HbA1C: No results for input(s): HGBA1C in the last 72 hours. CBG: No results for input(s): GLUCAP in the last 168 hours. Lipid Profile: No results for input(s): CHOL, HDL, LDLCALC, TRIG, CHOLHDL, LDLDIRECT in the last 72 hours. Thyroid Function Tests: No results for input(s): TSH, T4TOTAL, FREET4, T3FREE, THYROIDAB in the last 72 hours. Anemia Panel: No results for input(s): VITAMINB12, FOLATE, FERRITIN, TIBC, IRON, RETICCTPCT in the last 72 hours. Sepsis Labs: No results for input(s): PROCALCITON, LATICACIDVEN in the last 168 hours.  No results found for this or any previous visit (from the past 240 hour(s)).     Radiology Studies: Dg Chest Portable 1 View  Result Date: 07/13/2017 CLINICAL DATA:  Bilateral breast and lower extremity swelling. EXAM: PORTABLE CHEST 1 VIEW COMPARISON:  06/24/2017 FINDINGS: Dialysis catheter terminates within the expected location of right atrium. Cardiac silhouette is markedly enlarged.  Tortuosity of the aorta. There is no evidence of focal airspace consolidation, pleural effusion or pneumothorax. Osseous structures are without acute abnormality. Soft tissues are grossly normal. IMPRESSION: Markedly enlarged cardiac silhouette. No evidence of pulmonary edema or focal consolidation. Electronically Signed   By: Fidela Salisbury M.D.   On: 07/13/2017 01:27      Scheduled Meds: . aspirin EC  81 mg Oral Daily  . atorvastatin  40 mg Oral QHS  . carvedilol  25 mg Oral BID WC  . cloNIDine  0.2 mg Oral BID  . furosemide  80 mg Oral QHS  . heparin  5,000 Units Subcutaneous Q8H  . hydrALAZINE  100 mg Oral Daily  . lisinopril  40 mg Oral Daily  . sodium chloride flush  3 mL Intravenous Q12H   Continuous Infusions: . sodium chloride    . sodium chloride    . sodium chloride       LOS: 0 days    Time spent: 40 minutes   [name], DO [facility] [email or web address] Password [email or web address] 07/13/2017, 2:07 PM

## 2017-07-13 NOTE — ED Notes (Signed)
Patient denies pain and is resting comfortably.  

## 2017-07-13 NOTE — ED Provider Notes (Signed)
[facility] EMERGENCY DEPARTMENT Provider Note   CSN: [id number] Arrival date & time: 07/12/17  2314     History   Chief Complaint Chief Complaint  Patient presents with  . Weight Gain    HPI Angel [name] is a 46 y.o. male.  Patient states he has not had dialysis since November 19.  He is normally Monday, Wednesday, Friday.  He states he was not called by the dialysis center until this past week and did not know where he was supposed to go.  He has been on dialysis for the past year and does not make urine.  States he is 26 pounds over his dry weight.  He denies any shortness of breath or chest pain.  States he has leg swelling worse than baseline.  Denies any fever, chills, nausea or vomiting.  No abdominal pain, chest pain or back pain.  No headache.  States compliance with his blood pressure medications.  Patient has a history of leaving AMA as he did last time he was here when he requested dialysis.  States he is willing to stay today.   The history is provided by the patient.    Past Medical History:  Diagnosis Date  . CHF (congestive heart failure) ([place])   . ESRD on dialysis ([place])   . FSGS (focal segmental glomerulosclerosis)   . GERD (gastroesophageal reflux disease)   . HLD (hyperlipidemia)   . Hypertension   . Renal disorder   . Seizure ([place])   . Stroke (cerebrum) [facility])     Patient Active Problem List   Diagnosis Date Noted  . Non-ST elevation (NSTEMI) myocardial infarction ([place])   . Elevated troponin   . Hypertensive emergency 06/24/2017  . Hypertensive urgency 06/20/2017  . Fluid overload 06/20/2017  . Hyperkalemia 06/20/2017  . GERD (gastroesophageal reflux disease) 06/20/2017  . Seizure ([place]) 06/20/2017  . Abnormal LFTs 06/20/2017  . Anemia in ESRD (end-stage renal disease) ([place]) 06/20/2017  . HLD (hyperlipidemia)   . Stroke (cerebrum) ([place])   . ESRD on dialysis ([place])   . Acute on chronic diastolic CHF (congestive heart  failure) ([place])     Past Surgical History:  Procedure Laterality Date  . ABDOMINAL AORTIC ANEURYSM REPAIR    . KIDNEY SURGERY         Home Medications    Prior to Admission medications   Medication Sig Start Date End Date Taking? Authorizing Provider  aspirin EC 81 MG tablet Take 81 mg daily by mouth.    [provider]  atorvastatin (LIPITOR) 40 MG tablet Take 40 mg at bedtime by mouth.    [provider]  carvedilol (COREG) 25 MG tablet Take 25 mg 2 (two) times daily with a meal by mouth.    [provider]  cloNIDine (CATAPRES) 0.2 MG tablet Take 0.2 mg 2 (two) times daily by mouth.    [provider]  furosemide (LASIX) 80 MG tablet Take 80 mg at bedtime by mouth.    [provider]  hydrALAZINE (APRESOLINE) 50 MG tablet Take 100 mg 3 (three) times daily by mouth.    [provider]  lisinopril (PRINIVIL,ZESTRIL) 40 MG tablet Take 40 mg daily by mouth.    [provider]    Family History Family History  Problem Relation Age of Onset  . Hypertension Mother   . Kidney disease Mother   . Asthma Father   . Hypertension Father   . Kidney disease Father   .  Hypertension Sister   . Heart disease Sister   . Kidney disease Sister   . Heart disease Brother   . Hypertension Brother   . COPD Brother     Social History Social History   Tobacco Use  . Smoking status: Never Smoker  . Smokeless tobacco: Never Used  Substance Use Topics  . Alcohol use: No    Frequency: Never  . Drug use: No     Allergies   Hydralazine hcl   Review of Systems Review of Systems  Constitutional: Negative for activity change, appetite change and fever.  HENT: Negative for congestion.   Respiratory: Negative for cough, chest tightness and shortness of breath.   Cardiovascular: Negative for chest pain.  Gastrointestinal: Negative for abdominal pain, nausea and vomiting.  Genitourinary: Negative for dysuria, hematuria,  testicular pain and urgency.  Musculoskeletal: Negative for arthralgias and myalgias.  Skin: Negative for rash.  Neurological: Negative for dizziness, weakness and headaches.    all other systems are negative except as noted in the HPI and PMH.    Physical Exam Updated Vital Signs BP (!) 209/148 (BP Location: Right Arm)   Pulse 80   Temp 98.3 F (36.8 C) (Oral)   Resp 18   Ht 5\' 5"  (1.651 m)   Wt 71.6 kg (157 lb 13.6 oz)   SpO2 100%   BMI 26.27 kg/m   Physical Exam  Constitutional: He is oriented to person, place, and time. He appears well-developed and well-nourished. No distress.  HENT:  Head: Normocephalic and atraumatic.  Mouth/Throat: Oropharynx is clear and moist. No oropharyngeal exudate.  Eyes: Conjunctivae and EOM are normal. Pupils are equal, round, and reactive to light.  Neck: Normal range of motion. Neck supple.  No meningismus.  Cardiovascular: Normal rate, regular rhythm, normal heart sounds and intact distal pulses.  No murmur heard. Pulmonary/Chest: Effort normal and breath sounds normal. No respiratory distress.  Dialysis catheter R chest  Abdominal: Soft. There is no tenderness. There is no rebound and no guarding.  Musculoskeletal: Normal range of motion. He exhibits edema. He exhibits no tenderness.  +2 edema to knees  Neurological: He is alert and oriented to person, place, and time. No cranial nerve deficit. He exhibits normal muscle tone. Coordination normal.  No ataxia on finger to nose bilaterally. No pronator drift. 5/5 strength throughout. CN 2-12 intact.Equal grip strength. Sensation intact.   Skin: Skin is warm.  Psychiatric: He has a normal mood and affect. His behavior is normal.  Nursing note and vitals reviewed.    ED Treatments / Results  Labs (all labs ordered are listed, but only abnormal results are displayed) Labs Reviewed  BASIC METABOLIC PANEL - Abnormal; Notable for the following components:      Result Value   CO2 13 (*)     Glucose, Bld 118 (*)    BUN 194 (*)    Creatinine, Ser 26.47 (*)    Calcium 8.8 (*)    GFR calc non Af Amer 2 (*)    GFR calc Af Amer 2 (*)    Anion gap 23 (*)    All other components within normal limits  CBC - Abnormal; Notable for the following components:   RBC 3.11 (*)    Hemoglobin 8.8 (*)    HCT 26.0 (*)    RDW 16.7 (*)    Platelets 134 (*)    All other components within normal limits  I-STAT TROPONIN, ED - Abnormal; Notable for the following components:  Troponin i, poc 0.39 (*)    All other components within normal limits  I-STAT CHEM 8, ED    EKG  EKG Interpretation  Date/Time:  Friday July 13 2017 00:12:45 EST Ventricular Rate:  79 PR Interval:  160 QRS Duration: 82 QT Interval:  408 QTC Calculation: 467 R Axis:   -7 Text Interpretation:  Normal sinus rhythm Minimal voltage criteria for LVH, may be normal variant T wave abnormality, consider lateral ischemia Prolonged QT Abnormal ECG Artifact No significant change was found Confirmed by Ezequiel Essex [phone]) on 07/13/2017 12:24:34 AM       Radiology Dg Chest Portable 1 View  Result Date: 07/13/2017 CLINICAL DATA:  Bilateral breast and lower extremity swelling. EXAM: PORTABLE CHEST 1 VIEW COMPARISON:  06/24/2017 FINDINGS: Dialysis catheter terminates within the expected location of right atrium. Cardiac silhouette is markedly enlarged.  Tortuosity of the aorta. There is no evidence of focal airspace consolidation, pleural effusion or pneumothorax. Osseous structures are without acute abnormality. Soft tissues are grossly normal. IMPRESSION: Markedly enlarged cardiac silhouette. No evidence of pulmonary edema or focal consolidation. Electronically Signed   By: Fidela Salisbury M.D.   On: 07/13/2017 01:27    Procedures Procedures (including critical care time)  Medications Ordered in ED Medications  nitroGLYCERIN 50 mg in dextrose 5 % 250 mL (0.2 mg/mL) infusion (not administered)  hydrALAZINE  (APRESOLINE) injection 10 mg (not administered)     Initial Impression / Assessment and Plan / ED Course  I have reviewed the triage vital signs and the nursing notes.  Pertinent labs & imaging results that were available during my care of the patient were reviewed by me and considered in my medical decision making (see chart for details).    Patient with hypertensive emergency, needs dialysis, denies chest pain or shortness of breath.  EKG is unchanged.  Blood pressure is uncontrolled.  We will start IV nitroglycerin as well as hydralazine.  Patient presents with normal potassium. Discussed with Dr. Posey Pronto of nephrology who will arrange for dialysis in the morning.  Patient has no respiratory distress.  Admission for hypertensive emergency with volume overload discussed with Dr. Maudie Mercury.  Patient denies chest pain or shortness of breath.  He is not hypoxic.  Dialysis will be arranged for this morning through Dr. Posey Pronto.  CRITICAL CARE Performed by: Ezequiel Essex Total critical care time: 45 chest short of breath now he feels short of breath minutes Critical care time was exclusive of separately billable procedures and treating other patients. Critical care was necessary to treat or prevent imminent or life-threatening deterioration. Critical care was time spent personally by me on the following activities: development of treatment plan with patient and/or surrogate as well as nursing, discussions with consultants, evaluation of patient's response to treatment, examination of patient, obtaining history from patient or surrogate, ordering and performing treatments and interventions, ordering and review of laboratory studies, ordering and review of radiographic studies, pulse oximetry and re-evaluation of patient's condition.   Final Clinical Impressions(s) / ED Diagnoses   Final diagnoses:  Hypertensive emergency  ESRD (end stage renal disease) ([place])  Elevated troponin    ED Discharge  Orders    None       Lismary Kiehn, Annie Main, MD 07/13/17 [id number]

## 2017-07-13 NOTE — ED Notes (Signed)
Spoke with Dr. Maudie Mercury about pts status and blood pressure. Verbal to give all am meds to see if any changes.

## 2017-07-13 NOTE — Procedures (Signed)
Patient seen and examined on Hemodialysis. QB 400 mL/ min TDC UF goal 4L.  Pt reports blurry vision.  Hasn't had HD since 11/19.  BP elevated.  Will give clonidine now.  Pt has 1 hr 23 min to go of a 4 hr rx; will actually end early due to avoidance of dialysis disequilibrium.  Will bring back tomorrow.    Treatment adjusted as needed.  Madelon Lips MD [facility] pgr [phone] 11:38 AM

## 2017-07-13 NOTE — ED Notes (Signed)
Pt c/o headache and wants to know if we can stop the nitro. Will speak with md.

## 2017-07-13 NOTE — ED Notes (Signed)
MD Rancour made aware of Troponin level

## 2017-07-13 NOTE — ED Triage Notes (Signed)
Pt reports his last dialysis treatment was November 19, suppose to have it every 3 days. Pt says that he is here tonight because he "needs labs to get back into the clinic". Pt has been taking all of his meds as scheduled. Pt feels like he is retaining about 12lbs of fluid, has a rash on his forehead, and feels weak, but denies SOB or CP, "I am here before it gets to that point".

## 2017-07-13 NOTE — ED Notes (Signed)
Renal/Carb Modified diet breakfast tray ordered @ 0445.

## 2017-07-13 NOTE — ED Notes (Signed)
Spoke with pt about his blood pressure and medications. Per Dr. Maudie Mercury, verbal to give all four b/p morning medications at once. Will give two at this time and monitor b/p. If no change, will give other two. Pt states that Catapress and Lisinopril will probably work the best at this time. Pt denies pain, no sob. Aware that we are waiting on a bed assignment.

## 2017-07-14 LAB — CBC
HEMATOCRIT: 25.3 % — AB (ref 39.0–52.0)
Hemoglobin: 8.5 g/dL — ABNORMAL LOW (ref 13.0–17.0)
MCH: 27.5 pg (ref 26.0–34.0)
MCHC: 33.6 g/dL (ref 30.0–36.0)
MCV: 81.9 fL (ref 78.0–100.0)
PLATELETS: 138 10*3/uL — AB (ref 150–400)
RBC: 3.09 MIL/uL — ABNORMAL LOW (ref 4.22–5.81)
RDW: 16.2 % — AB (ref 11.5–15.5)
WBC: 7 10*3/uL (ref 4.0–10.5)

## 2017-07-14 LAB — BASIC METABOLIC PANEL
Anion gap: 18 — ABNORMAL HIGH (ref 5–15)
BUN: 119 mg/dL — ABNORMAL HIGH (ref 6–20)
CALCIUM: 8.4 mg/dL — AB (ref 8.9–10.3)
CO2: 19 mmol/L — AB (ref 22–32)
CREATININE: 17.84 mg/dL — AB (ref 0.61–1.24)
Chloride: 102 mmol/L (ref 101–111)
GFR, EST AFRICAN AMERICAN: 3 mL/min — AB (ref >60)
GFR, EST NON AFRICAN AMERICAN: 3 mL/min — AB (ref >60)
GLUCOSE: 106 mg/dL — AB (ref 65–99)
Potassium: 4.3 mmol/L (ref 3.5–5.1)
Sodium: 139 mmol/L (ref 135–145)

## 2017-07-14 MED ORDER — HYDRALAZINE HCL 50 MG PO TABS
100.0000 mg | ORAL_TABLET | Freq: Three times a day (TID) | ORAL | Status: DC
  Filled 2017-07-14: qty 2

## 2017-07-14 MED ORDER — SENNA 8.6 MG PO TABS
1.0000 | ORAL_TABLET | Freq: Every day | ORAL | Status: DC | PRN
  Administered 2017-07-14: 8.6 mg via ORAL
  Filled 2017-07-14: qty 1

## 2017-07-14 MED ORDER — CLONIDINE HCL 0.2 MG PO TABS
0.2000 mg | ORAL_TABLET | Freq: Three times a day (TID) | ORAL | Status: DC
  Administered 2017-07-14: 0.2 mg via ORAL
  Filled 2017-07-14 (×2): qty 1

## 2017-07-14 MED ORDER — HYDRALAZINE HCL 50 MG PO TABS
100.0000 mg | ORAL_TABLET | Freq: Every day | ORAL | Status: DC
  Filled 2017-07-14 (×2): qty 2

## 2017-07-14 NOTE — Progress Notes (Signed)
PROGRESS NOTE    Angel [name]  IRJ:[id number] DOB: 1971-05-16 DOA: 07/13/2017 PCP: Gwenlyn Saran, MD     Brief Narrative:  Angel [name] is 46 yo male with past medical hx of ESRD on HD, FSGS, HTN, previous stroke, seizure disorder, diastolic HF with recent admission for hypertensive emergency. He had left AMA at that time. He now presents to the ED with complaints of dyspnea and weight gain. He has missed dialysis for over a week. In the ED, he had uncontrolled BP. Nephrology was consulted for dialysis.   Assessment & Plan:   Principal Problem:   ESRD (end stage renal disease) ([place]) Active Problems:   Hyperkalemia   Anemia in ESRD (end-stage renal disease) ([name])   Elevated troponin   Hypertension   Malignant HTN  -Due to medical noncompliance -Resume coreg, catapres, hydralazine, lisinopril   ESRD on HD -Nephrology consulted, HD yesterday and again today   Acute on chronic combined systolic and diastolic heart failure -Echocardiogram with EF 30%, grade 2 diastolic dysfunction -Dialysis as above  Elevated troponin -Chronically elevated, due to ESRD likely with malignant HTN. Previous troponin 0.38 last admission on 11/20, 0.39 this admission -Denies CP   CAD -Continue aspirin, lipitor    DVT prophylaxis: subq hep Code Status: full Family Communication: no family at bedside Disposition Plan: pending improvement   Consultants:   Nephrology  Procedures:   None   Antimicrobials:  Anti-infectives (From admission, onward)   None       Subjective: Seen in HD. Doing well, denies chest pain, shortness of breath. Had some nausea and vomiting yesterday but none today. Continues to have swelling   Objective: Vitals:   07/14/17 1100 07/14/17 1110 07/14/17 1114 07/14/17 1212  BP: (!) 184/104 (!) 181/100  (!) 177/113  Pulse: 79 77  79  Resp: 18 17  18   Temp:  97.6 F (36.4 C)  98.3 F (36.8 C)  TempSrc:  Oral  Oral  SpO2:  100%  97%  Weight:    65.8 kg (145 lb 1 oz)   Height:        Intake/Output Summary (Last 24 hours) at 07/14/2017 1255 Last data filed at 07/14/2017 1224 Gross per 24 hour  Intake 203 ml  Output 3000 ml  Net -2797 ml   Filed Weights   07/14/17 0543 07/14/17 0800 07/14/17 1114  Weight: 68.8 kg (151 lb 9.6 oz) 68.8 kg (151 lb 9.6 oz) 65.8 kg (145 lb 1 oz)    Examination:  General exam: Appears calm and comfortable  Respiratory system: Clear to auscultation. Respiratory effort normal. Cardiovascular system: S1 & S2 heard, RRR. No JVD, murmurs, rubs, gallops or clicks. +3 edema  Gastrointestinal system: Abdomen with some distension, soft and nontender. No organomegaly or masses felt. Normal bowel sounds heard. Central nervous system: Alert and oriented. No focal neurological deficits. Extremities: Symmetric, +edema Skin: No rashes, lesions or ulcers Psychiatry: Judgement and insight appear normal. Mood & affect appropriate.   Data Reviewed: I have personally reviewed following labs and imaging studies  CBC: Recent Labs  Lab 07/13/17 0007 07/13/17 0758 07/13/17 1339 07/14/17 0359  WBC 9.1  --  8.1 7.0  HGB 8.8* 8.8* 9.5* 8.5*  HCT 26.0* 26.0* 27.6* 25.3*  MCV 83.6  --  81.2 81.9  PLT 134*  --  154 160*   Basic Metabolic Panel: Recent Labs  Lab 07/13/17 0007 07/13/17 0758 07/13/17 1339 07/14/17 0359  NA 141 142 139 139  K 5.0 5.1  3.8 4.3  CL 105 113* 100* 102  CO2 13*  --  20* 19*  GLUCOSE 118* 126* 90 106*  BUN 194* >140* 111* 119*  CREATININE 26.47* >18.00* 16.60* 17.84*  CALCIUM 8.8*  --  8.8* 8.4*   GFR: Estimated Creatinine Clearance: 4.5 mL/min (A) (by C-G formula based on SCr of 17.84 mg/dL (H)). Liver Function Tests: Recent Labs  Lab 07/13/17 1339  AST 26  ALT 42  ALKPHOS 114  BILITOT 1.0  PROT 6.6  ALBUMIN 3.1*   No results for input(s): LIPASE, AMYLASE in the last 168 hours. No results for input(s): AMMONIA in the last 168 hours. Coagulation Profile: No results for  input(s): INR, PROTIME in the last 168 hours. Cardiac Enzymes: No results for input(s): CKTOTAL, CKMB, CKMBINDEX, TROPONINI in the last 168 hours. BNP (last 3 results) No results for input(s): PROBNP in the last 8760 hours. HbA1C: No results for input(s): HGBA1C in the last 72 hours. CBG: No results for input(s): GLUCAP in the last 168 hours. Lipid Profile: No results for input(s): CHOL, HDL, LDLCALC, TRIG, CHOLHDL, LDLDIRECT in the last 72 hours. Thyroid Function Tests: No results for input(s): TSH, T4TOTAL, FREET4, T3FREE, THYROIDAB in the last 72 hours. Anemia Panel: No results for input(s): VITAMINB12, FOLATE, FERRITIN, TIBC, IRON, RETICCTPCT in the last 72 hours. Sepsis Labs: No results for input(s): PROCALCITON, LATICACIDVEN in the last 168 hours.  No results found for this or any previous visit (from the past 240 hour(s)).     Radiology Studies: Dg Chest Portable 1 View  Result Date: 07/13/2017 CLINICAL DATA:  Bilateral breast and lower extremity swelling. EXAM: PORTABLE CHEST 1 VIEW COMPARISON:  06/24/2017 FINDINGS: Dialysis catheter terminates within the expected location of right atrium. Cardiac silhouette is markedly enlarged.  Tortuosity of the aorta. There is no evidence of focal airspace consolidation, pleural effusion or pneumothorax. Osseous structures are without acute abnormality. Soft tissues are grossly normal. IMPRESSION: Markedly enlarged cardiac silhouette. No evidence of pulmonary edema or focal consolidation. Electronically Signed   By: Fidela Salisbury M.D.   On: 07/13/2017 01:27      Scheduled Meds: . aspirin EC  81 mg Oral Daily  . atorvastatin  40 mg Oral QHS  . carvedilol  25 mg Oral BID WC  . cloNIDine  0.2 mg Oral Q8H  . heparin  5,000 Units Subcutaneous Q8H  . hydrALAZINE  100 mg Oral Daily  . lisinopril  40 mg Oral Daily  . sodium chloride flush  3 mL Intravenous Q12H   Continuous Infusions: . sodium chloride    . sodium chloride    .  sodium chloride       LOS: 1 day    Time spent: 30 minutes   [name], DO [facility] [email or web address] Password [facility] 07/14/2017, 12:55 PM

## 2017-07-14 NOTE — Progress Notes (Signed)
Patient refusing hydralazine because "it makes me swell up."

## 2017-07-14 NOTE — Progress Notes (Signed)
Patient refused night time dose of Clonidine. Nurse educated patient. Patient alert and oriented. Verbalized understanding and refuses medication at this time. Will continue to monitor patient.

## 2017-07-14 NOTE — Progress Notes (Signed)
[facility] Progress Note  Subjective: no SOB , +leg swelling, no new c/o  Vitals:   07/14/17 1030 07/14/17 1100 07/14/17 1110 07/14/17 1114  BP: (!) 114/101 (!) 184/104 (!) 181/100   Pulse: 89 79 77   Resp: 17 18 17    Temp:   97.6 F (36.4 C)   TempSrc:   Oral   SpO2:   100%   Weight:    65.8 kg (145 lb 1 oz)  Height:        Inpatient medications: . aspirin EC  81 mg Oral Daily  . atorvastatin  40 mg Oral QHS  . carvedilol  25 mg Oral BID WC  . cloNIDine  0.2 mg Oral Q8H  . furosemide  80 mg Oral QHS  . heparin  5,000 Units Subcutaneous Q8H  . hydrALAZINE  100 mg Oral Q8H  . lisinopril  40 mg Oral Daily  . sodium chloride flush  3 mL Intravenous Q12H   . sodium chloride    . sodium chloride    . sodium chloride     sodium chloride, sodium chloride, sodium chloride, acetaminophen, alteplase, heparin, heparin, hydrALAZINE, lidocaine (PF), lidocaine-prilocaine, ondansetron (ZOFRAN) IV, pentafluoroprop-tetrafluoroeth, sodium chloride flush  Exam: Alert, no distress No jvd Chest clear bilat RRR no mrg ABd soft ntnd +bs Ext 2+ pitting edema diffuse LE's NF Ox3 R IJ TDC  Dialysis: not clear   Impression: 1. HTN'sive crisis - on 4 bp meds, vol overloaded. Get vol down w HD.  2. ESRD - on HD in [place] x 1 yr, no AVF, has TDC. Marked azotemia on admission, missed HD x 2 wks. Not sure what his status is here in [place] yet.   3. Vol overload - sig LE edema 4. MBD of CKD - phos 7 on admit 5. Anemia of CKD - Hb 8-9   Plan - HD today, get vol down. Get vein mapping.    Kelly Splinter MD [facility] pager [phone]   07/14/2017, 11:29 AM   Recent Labs  Lab 07/13/17 0007 07/13/17 0758 07/13/17 1339 07/14/17 0359  NA 141 142 139 139  K 5.0 5.1 3.8 4.3  CL 105 113* 100* 102  CO2 13*  --  20* 19*  GLUCOSE 118* 126* 90 106*  BUN 194* >140* 111* 119*  CREATININE 26.47* >18.00* 16.60* 17.84*  CALCIUM 8.8*  --  8.8* 8.4*   Recent  Labs  Lab 07/13/17 1339  AST 26  ALT 42  ALKPHOS 114  BILITOT 1.0  PROT 6.6  ALBUMIN 3.1*   Recent Labs  Lab 07/13/17 0007 07/13/17 0758 07/13/17 1339 07/14/17 0359  WBC 9.1  --  8.1 7.0  HGB 8.8* 8.8* 9.5* 8.5*  HCT 26.0* 26.0* 27.6* 25.3*  MCV 83.6  --  81.2 81.9  PLT 134*  --  154 138*   Iron/TIBC/Ferritin/ %Sat    Component Value Date/Time   IRON 30 (L) 06/25/2017 0127   TIBC 241 (L) 06/25/2017 0127   FERRITIN 1,472 (H) 06/25/2017 0127   IRONPCTSAT 12 (L) 06/25/2017 0127

## 2017-07-15 ENCOUNTER — Encounter (HOSPITAL_COMMUNITY): Payer: Medicare Other

## 2017-07-15 LAB — BASIC METABOLIC PANEL
ANION GAP: 14 (ref 5–15)
BUN: 78 mg/dL — ABNORMAL HIGH (ref 6–20)
CALCIUM: 8.4 mg/dL — AB (ref 8.9–10.3)
CO2: 24 mmol/L (ref 22–32)
Chloride: 99 mmol/L — ABNORMAL LOW (ref 101–111)
Creatinine, Ser: 13.28 mg/dL — ABNORMAL HIGH (ref 0.61–1.24)
GFR calc non Af Amer: 4 mL/min — ABNORMAL LOW (ref >60)
GFR, EST AFRICAN AMERICAN: 4 mL/min — AB (ref >60)
GLUCOSE: 90 mg/dL (ref 65–99)
POTASSIUM: 4 mmol/L (ref 3.5–5.1)
Sodium: 137 mmol/L (ref 135–145)

## 2017-07-15 MED ORDER — CLONIDINE HCL 0.2 MG PO TABS
0.2000 mg | ORAL_TABLET | Freq: Three times a day (TID) | ORAL | Status: DC
  Administered 2017-07-15 (×2): 0.2 mg via ORAL
  Filled 2017-07-15 (×3): qty 1

## 2017-07-15 MED ORDER — AMLODIPINE BESYLATE 10 MG PO TABS
10.0000 mg | ORAL_TABLET | Freq: Every day | ORAL | Status: DC
  Administered 2017-07-15 – 2017-07-16 (×2): 10 mg via ORAL
  Filled 2017-07-15 (×2): qty 1

## 2017-07-15 MED ORDER — CLONIDINE HCL 0.3 MG PO TABS: 0.3000 mg | ORAL_TABLET | Freq: Three times a day (TID) | ORAL | Status: DC

## 2017-07-15 MED ORDER — CLONIDINE HCL 0.2 MG PO TABS: 0.2000 mg | ORAL_TABLET | Freq: Three times a day (TID) | ORAL | Status: DC

## 2017-07-15 NOTE — Progress Notes (Signed)
PROGRESS NOTE    Angel [name]  XFG:[id number] DOB: [birth year] DOA: 07/13/2017 PCP: Gwenlyn Saran, MD     Brief Narrative:  Angel [name] is 46 yo male with past medical hx of ESRD on HD, FSGS, HTN, previous stroke, seizure disorder, diastolic HF with recent admission for hypertensive emergency. He had left AMA at that time. He now presents to the ED with complaints of dyspnea and weight gain. He has missed dialysis for over a week. In the ED, he had uncontrolled BP. Nephrology was consulted for dialysis.   Assessment & Plan:   Principal Problem:   ESRD (end stage renal disease) ([place]) Active Problems:   Hyperkalemia   Anemia in ESRD (end-stage renal disease) ([name])   Elevated troponin   Hypertension   Malignant HTN  -Due to medical noncompliance -Resume coreg, lisinopril. Patient has been refusing catapres (complains of constipation) and hydralazine (swelling). He is anuric so lasix has been stopped. Asked him if he is willing to try norvasc even though it can cause swelling as well and he is willing to try.   ESRD on HD -Nephrology consulted for HD   Acute on chronic combined systolic and diastolic heart failure -Echocardiogram with EF 96%, grade 2 diastolic dysfunction -Dialysis as above  Elevated troponin -Chronically elevated, due to ESRD likely with malignant HTN. Previous troponin 0.38 last admission on 11/20, 0.39 this admission. Left AMA last admission for further cardiac work up. He has outpatient follow up scheduled  -Denies CP   CAD -Continue aspirin, lipitor    DVT prophylaxis: subq hep Code Status: full Family Communication: no family at bedside Disposition Plan: pending improvement   Consultants:   Nephrology  Procedures:   None   Antimicrobials:  Anti-infectives (From admission, onward)   None       Subjective: No complaints. Continues to have swelling, no chest pain, SOB.    Objective: Vitals:   07/14/17 1212 07/14/17 1346  07/14/17 2207 07/15/17 0614  BP: (!) 177/113 (!) 162/108 (!) 177/115 (!) 197/134  Pulse: 79 84  95  Resp: 18   16  Temp: 98.3 F (36.8 C)   98.3 F (36.8 C)  TempSrc: Oral   Oral  SpO2: 97%   100%  Weight:    67.7 kg (149 lb 3.2 oz)  Height:        Intake/Output Summary (Last 24 hours) at 07/15/2017 1121 Last data filed at 07/15/2017 0926 Gross per 24 hour  Intake 723 ml  Output 0 ml  Net 723 ml   Filed Weights   07/14/17 0800 07/14/17 1114 07/15/17 0614  Weight: 68.8 kg (151 lb 9.6 oz) 65.8 kg (145 lb 1 oz) 67.7 kg (149 lb 3.2 oz)    Examination:  General exam: Appears calm and comfortable  Respiratory system: Clear to auscultation. Respiratory effort normal. Cardiovascular system: S1 & S2 heard, RRR. No JVD, murmurs, rubs, gallops or clicks. +2 edema  Gastrointestinal system: Abdomen with some distension, soft and nontender. No organomegaly or masses felt. Normal bowel sounds heard. Central nervous system: Alert and oriented. No focal neurological deficits. Extremities: Symmetric, +edema Skin: No rashes, lesions or ulcers Psychiatry: Judgement and insight appear normal. Mood & affect appropriate.   Data Reviewed: I have personally reviewed following labs and imaging studies  CBC: Recent Labs  Lab 07/13/17 0007 07/13/17 0758 07/13/17 1339 07/14/17 0359  WBC 9.1  --  8.1 7.0  HGB 8.8* 8.8* 9.5* 8.5*  HCT 26.0* 26.0* 27.6* 25.3*  MCV  83.6  --  81.2 81.9  PLT 134*  --  154 263*   Basic Metabolic Panel: Recent Labs  Lab 07/13/17 0007 07/13/17 0758 07/13/17 1339 07/14/17 0359 07/15/17 0449  NA 141 142 139 139 137  K 5.0 5.1 3.8 4.3 4.0  CL 105 113* 100* 102 99*  CO2 13*  --  20* 19* 24  GLUCOSE 118* 126* 90 106* 90  BUN 194* >140* 111* 119* 78*  CREATININE 26.47* >18.00* 16.60* 17.84* 13.28*  CALCIUM 8.8*  --  8.8* 8.4* 8.4*   GFR: Estimated Creatinine Clearance: 6 mL/min (A) (by C-G formula based on SCr of 13.28 mg/dL (H)). Liver Function Tests: Recent  Labs  Lab 07/13/17 1339  AST 26  ALT 42  ALKPHOS 114  BILITOT 1.0  PROT 6.6  ALBUMIN 3.1*   No results for input(s): LIPASE, AMYLASE in the last 168 hours. No results for input(s): AMMONIA in the last 168 hours. Coagulation Profile: No results for input(s): INR, PROTIME in the last 168 hours. Cardiac Enzymes: No results for input(s): CKTOTAL, CKMB, CKMBINDEX, TROPONINI in the last 168 hours. BNP (last 3 results) No results for input(s): PROBNP in the last 8760 hours. HbA1C: No results for input(s): HGBA1C in the last 72 hours. CBG: No results for input(s): GLUCAP in the last 168 hours. Lipid Profile: No results for input(s): CHOL, HDL, LDLCALC, TRIG, CHOLHDL, LDLDIRECT in the last 72 hours. Thyroid Function Tests: No results for input(s): TSH, T4TOTAL, FREET4, T3FREE, THYROIDAB in the last 72 hours. Anemia Panel: No results for input(s): VITAMINB12, FOLATE, FERRITIN, TIBC, IRON, RETICCTPCT in the last 72 hours. Sepsis Labs: No results for input(s): PROCALCITON, LATICACIDVEN in the last 168 hours.  No results found for this or any previous visit (from the past 240 hour(s)).     Radiology Studies: No results found.    Scheduled Meds: . amLODipine  10 mg Oral Daily  . aspirin EC  81 mg Oral Daily  . atorvastatin  40 mg Oral QHS  . carvedilol  25 mg Oral BID WC  . cloNIDine  0.2 mg Oral Q8H  . heparin  5,000 Units Subcutaneous Q8H  . hydrALAZINE  100 mg Oral Daily  . lisinopril  40 mg Oral Daily  . sodium chloride flush  3 mL Intravenous Q12H   Continuous Infusions: . sodium chloride    . sodium chloride    . sodium chloride       LOS: 2 days    Time spent: 30 minutes   [name], DO [facility] [email or web address] Password [email or web address] 07/15/2017, 11:21 AM

## 2017-07-15 NOTE — Progress Notes (Signed)
VASCULAR LAB    Patient refused vein mapping, 07/15/17 at 15:50.  I spoke with patient and let him know he will have to have it 12/10, and he said ok.  Oneika Simonian, RVT  07/15/2017, 4:01 PM

## 2017-07-15 NOTE — Progress Notes (Signed)
[facility] Progress Note  Subjective: no c/o's.  Has not been accepted yet by any of the [facility] HD units.   Vitals:   07/14/17 1346 07/14/17 2207 07/15/17 0614 07/15/17 1153  BP: (!) 162/108 (!) 177/115 (!) 197/134 (!) 162/113  Pulse: 84  95 90  Resp:   16 20  Temp:   98.3 F (36.8 C) 98.6 F (37 C)  TempSrc:   Oral Oral  SpO2:   100% 98%  Weight:   67.7 kg (149 lb 3.2 oz)   Height:        Inpatient medications: . amLODipine  10 mg Oral Daily  . aspirin EC  81 mg Oral Daily  . atorvastatin  40 mg Oral QHS  . carvedilol  25 mg Oral BID WC  . cloNIDine  0.2 mg Oral Q8H  . heparin  5,000 Units Subcutaneous Q8H  . hydrALAZINE  100 mg Oral Daily  . lisinopril  40 mg Oral Daily  . sodium chloride flush  3 mL Intravenous Q12H   . sodium chloride    . sodium chloride    . sodium chloride     sodium chloride, sodium chloride, sodium chloride, acetaminophen, alteplase, heparin, heparin, hydrALAZINE, lidocaine (PF), lidocaine-prilocaine, ondansetron (ZOFRAN) IV, pentafluoroprop-tetrafluoroeth, senna, sodium chloride flush  Exam: Alert, no distress No jvd Chest clear bilat RRR no mrg ABd soft ntnd +bs Ext 1+ edema bilat LE's NF Ox3 R IJ TDC  Dialysis: not clear   Impression: 1. HTN'sive crisis - on 5 bp meds, vol excess. BP's still up, asymptomatic.   2. ESRD - missed HD x 2 wks.  Was on HD in [place] x 1 yr, moving to this area. Not accepted yet in [place]. Azotemia has improved here after HD x 2.  He has agreed to vein mapping and meeting w/ a IT trainer to discuss AVF placement.  Has not agreed to fistula surgery yet.  3. Vol - + LE edema bilat 4. MBD of CKD - phos 7 on admit 5. Anemia of CKD - Hb 8-9   Plan - HD again on Monday, lower vol / solute. Will consult [facility] in am for possible perm access. Vein mapping ordered.    Kelly Splinter MD [facility] pager [phone]   07/15/2017, 4:07 PM   Recent Labs  Lab 07/13/17 1339  07/14/17 0359 07/15/17 0449  NA 139 139 137  K 3.8 4.3 4.0  CL 100* 102 99*  CO2 20* 19* 24  GLUCOSE 90 106* 90  BUN 111* 119* 78*  CREATININE 16.60* 17.84* 13.28*  CALCIUM 8.8* 8.4* 8.4*   Recent Labs  Lab 07/13/17 1339  AST 26  ALT 42  ALKPHOS 114  BILITOT 1.0  PROT 6.6  ALBUMIN 3.1*   Recent Labs  Lab 07/13/17 0007 07/13/17 0758 07/13/17 1339 07/14/17 0359  WBC 9.1  --  8.1 7.0  HGB 8.8* 8.8* 9.5* 8.5*  HCT 26.0* 26.0* 27.6* 25.3*  MCV 83.6  --  81.2 81.9  PLT 134*  --  154 138*   Iron/TIBC/Ferritin/ %Sat    Component Value Date/Time   IRON 30 (L) 06/25/2017 0127   TIBC 241 (L) 06/25/2017 0127   FERRITIN 1,472 (H) 06/25/2017 0127   IRONPCTSAT 12 (L) 06/25/2017 0127

## 2017-07-15 NOTE — Progress Notes (Signed)
Pt refused clonidine and hydralazine stating that they are "swelling him up". Pt educated, still does not want medications.

## 2017-07-16 ENCOUNTER — Inpatient Hospital Stay (HOSPITAL_COMMUNITY): Payer: Medicare Other

## 2017-07-16 ENCOUNTER — Ambulatory Visit: Payer: Medicare Other | Admitting: Physician Assistant

## 2017-07-16 DIAGNOSIS — N186 End stage renal disease: Secondary | ICD-10-CM

## 2017-07-16 LAB — BASIC METABOLIC PANEL
Anion gap: 14 (ref 5–15)
BUN: 92 mg/dL — AB (ref 6–20)
CHLORIDE: 98 mmol/L — AB (ref 101–111)
CO2: 24 mmol/L (ref 22–32)
Calcium: 8.4 mg/dL — ABNORMAL LOW (ref 8.9–10.3)
Creatinine, Ser: 14.43 mg/dL — ABNORMAL HIGH (ref 0.61–1.24)
GFR calc Af Amer: 4 mL/min — ABNORMAL LOW (ref >60)
GFR calc non Af Amer: 4 mL/min — ABNORMAL LOW (ref >60)
GLUCOSE: 86 mg/dL (ref 65–99)
POTASSIUM: 4.3 mmol/L (ref 3.5–5.1)
Sodium: 136 mmol/L (ref 135–145)

## 2017-07-16 LAB — GLUCOSE, CAPILLARY: GLUCOSE-CAPILLARY: 110 mg/dL — AB (ref 65–99)

## 2017-07-16 MED ORDER — DARBEPOETIN ALFA 60 MCG/0.3ML IJ SOSY
PREFILLED_SYRINGE | INTRAMUSCULAR | Status: AC
  Administered 2017-07-16: 60 ug via INTRAVENOUS
  Filled 2017-07-16: qty 0.3

## 2017-07-16 MED ORDER — DARBEPOETIN ALFA 60 MCG/0.3ML IJ SOSY
60.0000 ug | PREFILLED_SYRINGE | INTRAMUSCULAR | Status: DC
  Administered 2017-07-16: 60 ug via INTRAVENOUS
  Filled 2017-07-16: qty 0.3

## 2017-07-16 NOTE — Plan of Care (Signed)
Pt. With reqular appetite.

## 2017-07-16 NOTE — Progress Notes (Signed)
[facility] Progress Note  Subjective: no c/o's.  Still has not been accepted at any of the [facility] HD units.  Seen by [facility] for perm access, see their notes.   Vitals:   07/16/17 0602 07/16/17 0654 07/16/17 0800 07/16/17 1205  BP: (!) 163/120 (!) 137/102 (!) 162/103 (!) 165/109  Pulse: 86  81 82  Resp: 16 18  20   Temp: 98.6 F (37 C)   (!) 97.5 F (36.4 C)  TempSrc: Oral  Oral Oral  SpO2: 100%  100% 98%  Weight:      Height:        Inpatient medications: . amLODipine  10 mg Oral Daily  . aspirin EC  81 mg Oral Daily  . atorvastatin  40 mg Oral QHS  . carvedilol  25 mg Oral BID WC  . cloNIDine  0.2 mg Oral Q8H  . heparin  5,000 Units Subcutaneous Q8H  . hydrALAZINE  100 mg Oral Daily  . lisinopril  40 mg Oral Daily  . sodium chloride flush  3 mL Intravenous Q12H   . sodium chloride    . sodium chloride    . sodium chloride     sodium chloride, sodium chloride, sodium chloride, acetaminophen, hydrALAZINE, ondansetron (ZOFRAN) IV, senna, sodium chloride flush  Exam: Alert, no distress No jvd Chest clear bilat RRR no mrg ABd soft ntnd +bs Ext 1+ edema bilat LE's NF Ox3 R IJ TDC  CXR 12/7 - no edema  Home meds: -coreg 25 bid/ clon 0.2 bid/ lasix 80 hs/ hydralazine 100 qd/ lisinopril 40 qd -lipitor/ ecasa  Dialysis: orders not clear  Impression: 1. HTN'sive crisis - on his 4 home BP meds + norvasc.  BP's still up but improving.  2. ESRD - missed HD x 2 wks.  Was on HD in [place] x 1 yr, moving to this area. Azotemia is better, to get 3rd HD here today. Doesn't have an OP HD unit in [place] yet.  3. Perm access - seen by [facility] today 4. Vol excess - still w/ LE edema bilat 5. MBD of CKD - phos 7 on admit 6. Anemia of CKD - Hb 8-10, will start esa here and check fe/ tibc   Plan - HD today, cont MWF for now while here  Kelly Splinter MD [facility] pager [phone]   07/16/2017, 12:52 PM   Recent Labs  Lab 07/14/17 0359 07/15/17 0449  07/16/17 0425  NA 139 137 136  K 4.3 4.0 4.3  CL 102 99* 98*  CO2 19* 24 24  GLUCOSE 106* 90 86  BUN 119* 78* 92*  CREATININE 17.84* 13.28* 14.43*  CALCIUM 8.4* 8.4* 8.4*   Recent Labs  Lab 07/13/17 1339  AST 26  ALT 42  ALKPHOS 114  BILITOT 1.0  PROT 6.6  ALBUMIN 3.1*   Recent Labs  Lab 07/13/17 0007 07/13/17 0758 07/13/17 1339 07/14/17 0359  WBC 9.1  --  8.1 7.0  HGB 8.8* 8.8* 9.5* 8.5*  HCT 26.0* 26.0* 27.6* 25.3*  MCV 83.6  --  81.2 81.9  PLT 134*  --  154 138*   Iron/TIBC/Ferritin/ %Sat    Component Value Date/Time   IRON 30 (L) 06/25/2017 0127   TIBC 241 (L) 06/25/2017 0127   FERRITIN 1,472 (H) 06/25/2017 0127   IRONPCTSAT 12 (L) 06/25/2017 0127

## 2017-07-16 NOTE — Progress Notes (Signed)
Patient refused Hydralazine dose, educated, verbalized understanding. Patient is alert and oriented.

## 2017-07-16 NOTE — Progress Notes (Signed)
RN paged because pt wants to leave AMA. Pt is alert and oriented. Pt states some doctor told him he could leave after dialysis today. NP reviewed chart. Nowhere in the chart does it state that pt can leave after dialysis today. Matter of fact, each note states that pt needs to stay for further care, i.e., he is still volume overloaded, he needs a permanent catheter for HD, needs vein mapping for this, needs to be set up with outpt HD, and BP needs to be in better control, etc. NP talked to pt on the telephone. Told pt that he is supposed to stay because of the above continued problems. He stated that he is going to [facility] permanently. He stated "they don't do permanent dialysis catheters" there and I assured him he would need a permanent catheter if he continued HD. I told him that he would need HD at least in 48 hrs maybe sooner and he should to to the ED. Pt stated "I know", but still would not consent to stay here after all the above conversation about the risks of leaving, including morbidity and mortality.  KJKG, NP [facility]

## 2017-07-16 NOTE — Progress Notes (Signed)
PROGRESS NOTE    Angel [name]  ZOX:[id number] DOB: 05-02-1971 DOA: 07/13/2017 PCP: Gwenlyn Saran, MD     Brief Narrative:  Angel [name] is 46 yo male with past medical hx of ESRD on HD, FSGS, HTN, previous stroke, seizure disorder, diastolic HF with recent admission for hypertensive emergency. He had left AMA at that time. He now presents to the ED with complaints of dyspnea and weight gain. He has missed dialysis for over a week. In the ED, he had uncontrolled BP. Nephrology was consulted for dialysis.   Assessment & Plan:   Principal Problem:   ESRD (end stage renal disease) ([place]) Active Problems:   Hyperkalemia   Anemia in ESRD (end-stage renal disease) ([name])   Elevated troponin   Hypertension   Malignant HTN  -Due to medical noncompliance -Resume coreg, lisinopril. Patient has been refusing catapres (complains of constipation) and hydralazine (swelling). He is anuric so lasix has been stopped. Asked him if he is willing to try norvasc even though it can cause swelling as well and he is willing to try. BP improved but still remains elevated 162/103.   ESRD on HD -Nephrology consulted for HD. Refused vein mapping yesterday. Not accepted for HD yet in [place]. HD planned for today.   Acute on chronic combined systolic and diastolic heart failure -Echocardiogram with EF 81%, grade 2 diastolic dysfunction -Dialysis as above  Elevated troponin -Chronically elevated, due to ESRD likely with malignant HTN. Previous troponin 0.38 last admission on 11/20, 0.39 this admission. Left AMA last admission for further cardiac work up. He has outpatient follow up scheduled  -Denies CP   CAD -Continue aspirin, lipitor    DVT prophylaxis: subq hep Code Status: full Family Communication: no family at bedside Disposition Plan: pending improvement. Patient states that he will leave today after HD session. We discussed the need for vein mapping, vascular surgery consultation,  HD placement in town, as well as improved BP control. He states that he has things to attend to and will get HD today and leave, even if he signs out AMA. I discouraged this decision as he does not have a good plan for HD follow up yet. He told me he will just come back to the hospital for HD.    Consultants:   Nephrology  Procedures:   None   Antimicrobials:  Anti-infectives (From admission, onward)   None       Subjective: No complaints. Swelling improved. No SOB, no chest pain.    Objective: Vitals:   07/16/17 0234 07/16/17 0602 07/16/17 0654 07/16/17 0800  BP:  (!) 163/120 (!) 137/102 (!) 162/103  Pulse:  86  81  Resp:  16 18   Temp:  98.6 F (37 C)    TempSrc:  Oral  Oral  SpO2:  100%  100%  Weight: 68.2 kg (150 lb 4.8 oz)     Height:        Intake/Output Summary (Last 24 hours) at 07/16/2017 0907 Last data filed at 07/15/2017 2200 Gross per 24 hour  Intake 960 ml  Output 0 ml  Net 960 ml   Filed Weights   07/14/17 1114 07/15/17 0614 07/16/17 0234  Weight: 65.8 kg (145 lb 1 oz) 67.7 kg (149 lb 3.2 oz) 68.2 kg (150 lb 4.8 oz)    Examination:  General exam: Appears calm and comfortable  Respiratory system: Clear to auscultation. Respiratory effort normal. Cardiovascular system: S1 & S2 heard, RRR. No JVD, murmurs, rubs, gallops or  clicks. +1 edema   Gastrointestinal system: Abdomen with some distension, soft and nontender. No organomegaly or masses felt. Normal bowel sounds heard. Central nervous system: Alert and oriented. No focal neurological deficits. Extremities: Symmetric, +edema Skin: No rashes, lesions or ulcers Psychiatry: Judgement and insight appear poor   Data Reviewed: I have personally reviewed following labs and imaging studies  CBC: Recent Labs  Lab 07/13/17 0007 07/13/17 0758 07/13/17 1339 07/14/17 0359  WBC 9.1  --  8.1 7.0  HGB 8.8* 8.8* 9.5* 8.5*  HCT 26.0* 26.0* 27.6* 25.3*  MCV 83.6  --  81.2 81.9  PLT 134*  --  154 138*     Basic Metabolic Panel: Recent Labs  Lab 07/13/17 0007 07/13/17 0758 07/13/17 1339 07/14/17 0359 07/15/17 0449 07/16/17 0425  NA 141 142 139 139 137 136  K 5.0 5.1 3.8 4.3 4.0 4.3  CL 105 113* 100* 102 99* 98*  CO2 13*  --  20* 19* 24 24  GLUCOSE 118* 126* 90 106* 90 86  BUN 194* >140* 111* 119* 78* 92*  CREATININE 26.47* >18.00* 16.60* 17.84* 13.28* 14.43*  CALCIUM 8.8*  --  8.8* 8.4* 8.4* 8.4*   GFR: Estimated Creatinine Clearance: 5.6 mL/min (A) (by C-G formula based on SCr of 14.43 mg/dL (H)). Liver Function Tests: Recent Labs  Lab 07/13/17 1339  AST 26  ALT 42  ALKPHOS 114  BILITOT 1.0  PROT 6.6  ALBUMIN 3.1*   No results for input(s): LIPASE, AMYLASE in the last 168 hours. No results for input(s): AMMONIA in the last 168 hours. Coagulation Profile: No results for input(s): INR, PROTIME in the last 168 hours. Cardiac Enzymes: No results for input(s): CKTOTAL, CKMB, CKMBINDEX, TROPONINI in the last 168 hours. BNP (last 3 results) No results for input(s): PROBNP in the last 8760 hours. HbA1C: No results for input(s): HGBA1C in the last 72 hours. CBG: No results for input(s): GLUCAP in the last 168 hours. Lipid Profile: No results for input(s): CHOL, HDL, LDLCALC, TRIG, CHOLHDL, LDLDIRECT in the last 72 hours. Thyroid Function Tests: No results for input(s): TSH, T4TOTAL, FREET4, T3FREE, THYROIDAB in the last 72 hours. Anemia Panel: No results for input(s): VITAMINB12, FOLATE, FERRITIN, TIBC, IRON, RETICCTPCT in the last 72 hours. Sepsis Labs: No results for input(s): PROCALCITON, LATICACIDVEN in the last 168 hours.  No results found for this or any previous visit (from the past 240 hour(s)).     Radiology Studies: No results found.    Scheduled Meds: . amLODipine  10 mg Oral Daily  . aspirin EC  81 mg Oral Daily  . atorvastatin  40 mg Oral QHS  . carvedilol  25 mg Oral BID WC  . cloNIDine  0.2 mg Oral Q8H  . heparin  5,000 Units Subcutaneous  Q8H  . hydrALAZINE  100 mg Oral Daily  . lisinopril  40 mg Oral Daily  . sodium chloride flush  3 mL Intravenous Q12H   Continuous Infusions: . sodium chloride    . sodium chloride    . sodium chloride       LOS: 3 days    Time spent: 30 minutes   [name], DO [facility] [email or web address] Password [email or web address] 07/16/2017, 9:07 AM

## 2017-07-16 NOTE — Consult Note (Signed)
Hospital Consult    Reason for Consult:  HD access Referring Physician:  Dr. Jonnie Finner MRN #:  [id number]  History of Present Illness: This is a 46 y.o. male on hd via catheter for several months. This was placed in [place]. Has been worked up for transplant at [facility]. Is left hand dominant. Denies any previous access or upper extremity surgery. Does not take blood thinners.   Past Medical History:  Diagnosis Date  . CHF (congestive heart failure) ([place])   . ESRD on dialysis ([place])   . FSGS (focal segmental glomerulosclerosis)   . GERD (gastroesophageal reflux disease)   . HLD (hyperlipidemia)   . Hypertension   . Renal disorder   . Seizure ([place])   . Stroke (cerebrum) [facility])     Past Surgical History:  Procedure Laterality Date  . ABDOMINAL AORTIC ANEURYSM REPAIR    . KIDNEY SURGERY      Allergies  Allergen Reactions  . Hydralazine Hcl Swelling    Leg swelling     Prior to Admission medications   Medication Sig Start Date End Date Taking? Authorizing Provider  aspirin EC 81 MG tablet Take 81 mg daily by mouth.   Yes [provider]  atorvastatin (LIPITOR) 40 MG tablet Take 40 mg at bedtime by mouth.   Yes [provider]  carvedilol (COREG) 25 MG tablet Take 25 mg 2 (two) times daily with a meal by mouth.   Yes [provider]  cloNIDine (CATAPRES) 0.2 MG tablet Take 0.2 mg 2 (two) times daily by mouth.   Yes [provider]  furosemide (LASIX) 80 MG tablet Take 80 mg at bedtime by mouth.   Yes [provider]  hydrALAZINE (APRESOLINE) 50 MG tablet Take 100 mg by mouth daily.    Yes [provider]  lisinopril (PRINIVIL,ZESTRIL) 40 MG tablet Take 40 mg daily by mouth.   Yes [provider]    Social History   Socioeconomic History  . Marital status: Single    Spouse name: Not on file  . Number of children: Not on file  . Years of education: Not on file  . Highest education level: Not on file    Social Needs  . Financial resource strain: Not on file  . Food insecurity - worry: Not on file  . Food insecurity - inability: Not on file  . Transportation needs - medical: Not on file  . Transportation needs - non-medical: Not on file  Occupational History  . Not on file  Tobacco Use  . Smoking status: Never Smoker  . Smokeless tobacco: Never Used  Substance and Sexual Activity  . Alcohol use: No    Frequency: Never  . Drug use: No  . Sexual activity: Not on file  Other Topics Concern  . Not on file  Social History Narrative  . Not on file     Family History  Problem Relation Age of Onset  . Hypertension Mother   . Kidney disease Mother   . Asthma Father   . Hypertension Father   . Kidney disease Father   . Hypertension Sister   . Heart disease Sister   . Kidney disease Sister   . Heart disease Brother   . Hypertension Brother   . COPD Brother    REVIEW OF SYSTEMS (negative unless checked):   Cardiac:  []  Chest pain or chest pressure? []  Shortness of breath upon activity? []  Shortness of breath when lying flat? []  Irregular heart rhythm?  Vascular:  []  Pain in calf, thigh, or hip brought on by walking? []  Pain in feet at night that wakes you up from your sleep? []  Blood clot in your veins? []  Leg swelling?  Pulmonary:  []  Oxygen at home? []  Productive cough? []  Wheezing?  Neurologic:  []  Sudden weakness in arms or legs? []  Sudden numbness in arms or legs? []  Sudden onset of difficult speaking or slurred speech? []  Temporary loss of vision in one eye? []  Problems with dizziness?  Gastrointestinal:  []  Blood in stool? []  Vomited blood?  Genitourinary:  []  Burning when urinating? []  Blood in urine?  Psychiatric:  []  Major depression  Hematologic:  []  Bleeding problems? []  Problems with blood clotting?  Dermatologic:  []  Rashes or ulcers?  Constitutional:  []  Fever or chills?  Ear/Nose/Throat:  []  Change in hearing? []  Nose  bleeds? []  Sore throat?  Musculoskeletal:  []  Back pain? []  Joint pain? []  Muscle pain?    Physical Examination  Vitals:   07/16/17 0654 07/16/17 0800  BP: (!) 137/102 (!) 162/103  Pulse:  81  Resp: 18   Temp:    SpO2:  100%   Body mass index is 25.01 kg/m.  General:  WDWN in NAD HENT: WNL, normocephalic Pulmonary: normal non-labored breathing, without Rales, rhonchi,  wheezing Cardiac: palpable radial and brachial pulses bilaterally Abdomen: soft, NT/ND, no masses Musculoskeletal: no muscle wasting or atrophy  Neurologic: A&O X 3; SENSATION: normal; MOTOR FUNCTION:  moving all extremities equally. Speech is fluent/normal Psychiatric: appropriate mood and affect  CBC    Component Value Date/Time   WBC 7.0 07/14/2017 0359   RBC 3.09 (L) 07/14/2017 0359   HGB 8.5 (L) 07/14/2017 0359   HCT 25.3 (L) 07/14/2017 0359   HCT 21.5 (L) 06/25/2017 0321   PLT 138 (L) 07/14/2017 0359   MCV 81.9 07/14/2017 0359   MCH 27.5 07/14/2017 0359   MCHC 33.6 07/14/2017 0359   RDW 16.2 (H) 07/14/2017 0359   LYMPHSABS 1.5 06/20/2017 0036   MONOABS 0.6 06/20/2017 0036   EOSABS 0.3 06/20/2017 0036   BASOSABS 0.0 06/20/2017 0036    BMET    Component Value Date/Time   NA 136 07/16/2017 0425   K 4.3 07/16/2017 0425   CL 98 (L) 07/16/2017 0425   CO2 24 07/16/2017 0425   GLUCOSE 86 07/16/2017 0425   BUN 92 (H) 07/16/2017 0425   CREATININE 14.43 (H) 07/16/2017 0425   CALCIUM 8.4 (L) 07/16/2017 0425   GFRNONAA 4 (L) 07/16/2017 0425   GFRAA 4 (L) 07/16/2017 0425    COAGS: Lab Results  Component Value Date   INR 1.21 06/27/2017     Non-Invasive Vascular Imaging:   Vein mapping pending   ASSESSMENT/PLAN: This is a 46 y.o. male with esrd on hd via tdc in need of permanent access. Will get vein mapping and plan definitive upper extremity access prior to discharge.   Brandon C. Donzetta Matters, MD [facility] Office: [phone] Pager: [phone]

## 2017-07-16 NOTE — Progress Notes (Signed)
Patient asking to leave AMA. Nurse paged APP who spoke to patient over the phone. Patient states he wants to leave [facility] and get treatment elsewhere. Patient is alert and oriented. AMA paperwork signed.

## 2017-07-16 NOTE — Progress Notes (Signed)
Vein mapping completed. Landry Mellow, RDMS, RVT

## 2017-07-17 NOTE — Discharge Summary (Signed)
Angel [name] is 46 yo male with past medical hx of ESRD on HD, FSGS, HTN, previous stroke, seizure disorder, diastolic HF with recent admission for hypertensive emergency. He had left AMA at that time. He now presents to the ED with complaints of dyspnea and weight gain. He has missed dialysis for over a week. In the ED, he had uncontrolled BP. Nephrology was consulted for dialysis. Vascular surgery also consulted for permanent access.   Overnight on 12/10, patient signed out against medical advice. He is at high risk for readmission due to ESRD, malignant hypertension, and heart failure.    [name], DO [facility] [email or web address] Password [facility] 07/17/2017, 3:37 PM

## 2017-07-23 ENCOUNTER — Other Ambulatory Visit: Payer: Self-pay

## 2017-07-23 ENCOUNTER — Emergency Department (HOSPITAL_COMMUNITY): Payer: Medicare Other

## 2017-07-23 ENCOUNTER — Encounter (HOSPITAL_COMMUNITY): Payer: Self-pay | Admitting: *Deleted

## 2017-07-23 ENCOUNTER — Inpatient Hospital Stay (HOSPITAL_COMMUNITY)
Admission: EM | Admit: 2017-07-23 | Discharge: 2017-07-24 | DRG: 640 | Discharge status: Against Medical Advice | Payer: Medicare Other | Attending: Internal Medicine | Admitting: Internal Medicine

## 2017-07-23 DIAGNOSIS — E8770 Fluid overload, unspecified: Principal | ICD-10-CM | POA: Diagnosis present

## 2017-07-23 DIAGNOSIS — I252 Old myocardial infarction: Secondary | ICD-10-CM

## 2017-07-23 DIAGNOSIS — D631 Anemia in chronic kidney disease: Secondary | ICD-10-CM | POA: Diagnosis present

## 2017-07-23 DIAGNOSIS — Z992 Dependence on renal dialysis: Secondary | ICD-10-CM

## 2017-07-23 DIAGNOSIS — N19 Unspecified kidney failure: Secondary | ICD-10-CM | POA: Diagnosis present

## 2017-07-23 DIAGNOSIS — T465X6A Underdosing of other antihypertensive drugs, initial encounter: Secondary | ICD-10-CM | POA: Diagnosis present

## 2017-07-23 DIAGNOSIS — N186 End stage renal disease: Secondary | ICD-10-CM | POA: Diagnosis present

## 2017-07-23 DIAGNOSIS — Z91128 Patient's intentional underdosing of medication regimen for other reason: Secondary | ICD-10-CM | POA: Diagnosis not present

## 2017-07-23 DIAGNOSIS — Z9115 Patient's noncompliance with renal dialysis: Secondary | ICD-10-CM | POA: Diagnosis present

## 2017-07-23 DIAGNOSIS — I160 Hypertensive urgency: Secondary | ICD-10-CM | POA: Diagnosis present

## 2017-07-23 DIAGNOSIS — I509 Heart failure, unspecified: Secondary | ICD-10-CM | POA: Diagnosis present

## 2017-07-23 DIAGNOSIS — N2581 Secondary hyperparathyroidism of renal origin: Secondary | ICD-10-CM | POA: Diagnosis present

## 2017-07-23 DIAGNOSIS — E872 Acidosis: Secondary | ICD-10-CM | POA: Diagnosis present

## 2017-07-23 DIAGNOSIS — E875 Hyperkalemia: Secondary | ICD-10-CM | POA: Diagnosis present

## 2017-07-23 DIAGNOSIS — I132 Hypertensive heart and chronic kidney disease with heart failure and with stage 5 chronic kidney disease, or end stage renal disease: Secondary | ICD-10-CM | POA: Diagnosis present

## 2017-07-23 DIAGNOSIS — Z8249 Family history of ischemic heart disease and other diseases of the circulatory system: Secondary | ICD-10-CM | POA: Diagnosis not present

## 2017-07-23 DIAGNOSIS — Z8673 Personal history of transient ischemic attack (TIA), and cerebral infarction without residual deficits: Secondary | ICD-10-CM

## 2017-07-23 DIAGNOSIS — Z79899 Other long term (current) drug therapy: Secondary | ICD-10-CM | POA: Diagnosis not present

## 2017-07-23 DIAGNOSIS — E785 Hyperlipidemia, unspecified: Secondary | ICD-10-CM | POA: Diagnosis present

## 2017-07-23 DIAGNOSIS — Z841 Family history of disorders of kidney and ureter: Secondary | ICD-10-CM

## 2017-07-23 DIAGNOSIS — Z888 Allergy status to other drugs, medicaments and biological substances status: Secondary | ICD-10-CM | POA: Diagnosis not present

## 2017-07-23 DIAGNOSIS — I10 Essential (primary) hypertension: Secondary | ICD-10-CM | POA: Diagnosis present

## 2017-07-23 DIAGNOSIS — E8729 Other acidosis: Secondary | ICD-10-CM | POA: Diagnosis present

## 2017-07-23 DIAGNOSIS — Z7982 Long term (current) use of aspirin: Secondary | ICD-10-CM | POA: Diagnosis not present

## 2017-07-23 HISTORY — DX: Chronic combined systolic (congestive) and diastolic (congestive) heart failure: I50.42

## 2017-07-23 HISTORY — DX: Anemia, unspecified: D64.9

## 2017-07-23 HISTORY — DX: Non-ST elevation (NSTEMI) myocardial infarction: I21.4

## 2017-07-23 LAB — COMPREHENSIVE METABOLIC PANEL
ALT: 60 U/L (ref 17–63)
ANION GAP: 21 — AB (ref 5–15)
AST: 44 U/L — AB (ref 15–41)
Albumin: 3.1 g/dL — ABNORMAL LOW (ref 3.5–5.0)
Alkaline Phosphatase: 152 U/L — ABNORMAL HIGH (ref 38–126)
BUN: 121 mg/dL — AB (ref 6–20)
CHLORIDE: 102 mmol/L (ref 101–111)
CO2: 17 mmol/L — ABNORMAL LOW (ref 22–32)
Calcium: 9 mg/dL (ref 8.9–10.3)
Creatinine, Ser: 18.57 mg/dL — ABNORMAL HIGH (ref 0.61–1.24)
GFR, EST AFRICAN AMERICAN: 3 mL/min — AB (ref >60)
GFR, EST NON AFRICAN AMERICAN: 3 mL/min — AB (ref >60)
Glucose, Bld: 102 mg/dL — ABNORMAL HIGH (ref 65–99)
POTASSIUM: 5 mmol/L (ref 3.5–5.1)
Sodium: 140 mmol/L (ref 135–145)
TOTAL PROTEIN: 6.4 g/dL — AB (ref 6.5–8.1)
Total Bilirubin: 1.1 mg/dL (ref 0.3–1.2)

## 2017-07-23 LAB — CBC
HEMATOCRIT: 27.2 % — AB (ref 39.0–52.0)
HEMOGLOBIN: 9.1 g/dL — AB (ref 13.0–17.0)
MCH: 27.9 pg (ref 26.0–34.0)
MCHC: 33.5 g/dL (ref 30.0–36.0)
MCV: 83.4 fL (ref 78.0–100.0)
Platelets: 198 10*3/uL (ref 150–400)
RBC: 3.26 MIL/uL — AB (ref 4.22–5.81)
RDW: 15.4 % (ref 11.5–15.5)
WBC: 7.8 10*3/uL (ref 4.0–10.5)

## 2017-07-23 LAB — BASIC METABOLIC PANEL
ANION GAP: 20 — AB (ref 5–15)
BUN: 126 mg/dL — ABNORMAL HIGH (ref 6–20)
CHLORIDE: 102 mmol/L (ref 101–111)
CO2: 17 mmol/L — ABNORMAL LOW (ref 22–32)
Calcium: 9 mg/dL (ref 8.9–10.3)
Creatinine, Ser: 18.28 mg/dL — ABNORMAL HIGH (ref 0.61–1.24)
GFR, EST AFRICAN AMERICAN: 3 mL/min — AB (ref >60)
GFR, EST NON AFRICAN AMERICAN: 3 mL/min — AB (ref >60)
Glucose, Bld: 117 mg/dL — ABNORMAL HIGH (ref 65–99)
POTASSIUM: 5.1 mmol/L (ref 3.5–5.1)
SODIUM: 139 mmol/L (ref 135–145)

## 2017-07-23 LAB — LIPASE, BLOOD: Lipase: 45 U/L (ref 11–51)

## 2017-07-23 LAB — GLUCOSE, CAPILLARY: GLUCOSE-CAPILLARY: 110 mg/dL — AB (ref 65–99)

## 2017-07-23 MED ORDER — VITAMIN D 1000 UNITS PO TABS
1000.0000 [IU] | ORAL_TABLET | Freq: Two times a day (BID) | ORAL | Status: DC
  Administered 2017-07-23 – 2017-07-24 (×3): 1000 [IU] via ORAL
  Filled 2017-07-23 (×3): qty 1

## 2017-07-23 MED ORDER — NITROGLYCERIN IN D5W 200-5 MCG/ML-% IV SOLN: 0.0000 ug/min | Freq: Once | INTRAVENOUS | Status: DC

## 2017-07-23 MED ORDER — ACETAMINOPHEN 325 MG PO TABS: 650.0000 mg | ORAL_TABLET | Freq: Four times a day (QID) | ORAL | Status: DC | PRN

## 2017-07-23 MED ORDER — SEVELAMER CARBONATE 800 MG PO TABS: 1600.0000 mg | ORAL_TABLET | Freq: Two times a day (BID) | ORAL | Status: DC | PRN

## 2017-07-23 MED ORDER — HYDROCODONE-ACETAMINOPHEN 5-325 MG PO TABS: 1.0000 | ORAL_TABLET | ORAL | Status: DC | PRN

## 2017-07-23 MED ORDER — SEVELAMER CARBONATE 800 MG PO TABS
1600.0000 mg | ORAL_TABLET | Freq: Three times a day (TID) | ORAL | Status: DC
  Administered 2017-07-23: 1600 mg via ORAL
  Filled 2017-07-23 (×2): qty 2

## 2017-07-23 MED ORDER — HYDRALAZINE HCL 20 MG/ML IJ SOLN
10.0000 mg | Freq: Once | INTRAMUSCULAR | Status: AC
  Administered 2017-07-23: 10 mg via INTRAVENOUS
  Filled 2017-07-23: qty 1

## 2017-07-23 MED ORDER — CARVEDILOL 25 MG PO TABS: 25.0000 mg | ORAL_TABLET | Freq: Two times a day (BID) | ORAL | Status: DC

## 2017-07-23 MED ORDER — CLONIDINE HCL 0.1 MG PO TABS
0.1000 mg | ORAL_TABLET | Freq: Three times a day (TID) | ORAL | Status: DC
  Administered 2017-07-23: 0.1 mg via ORAL
  Filled 2017-07-23: qty 1

## 2017-07-23 MED ORDER — CARVEDILOL 12.5 MG PO TABS
12.5000 mg | ORAL_TABLET | Freq: Two times a day (BID) | ORAL | Status: DC
  Administered 2017-07-23 – 2017-07-24 (×2): 12.5 mg via ORAL
  Filled 2017-07-23 (×3): qty 1

## 2017-07-23 MED ORDER — LISINOPRIL 40 MG PO TABS
40.0000 mg | ORAL_TABLET | Freq: Every day | ORAL | Status: DC
  Administered 2017-07-23 – 2017-07-24 (×2): 40 mg via ORAL
  Filled 2017-07-23 (×2): qty 1

## 2017-07-23 MED ORDER — TRAMADOL HCL 50 MG PO TABS: 50.0000 mg | ORAL_TABLET | Freq: Two times a day (BID) | ORAL | Status: DC | PRN

## 2017-07-23 MED ORDER — ACETAMINOPHEN 650 MG RE SUPP: 650.0000 mg | Freq: Four times a day (QID) | RECTAL | Status: DC | PRN

## 2017-07-23 MED ORDER — HEPARIN SODIUM (PORCINE) 5000 UNIT/ML IJ SOLN
5000.0000 [IU] | Freq: Three times a day (TID) | INTRAMUSCULAR | Status: DC
  Filled 2017-07-23: qty 1

## 2017-07-23 MED ORDER — ASPIRIN EC 81 MG PO TBEC
81.0000 mg | DELAYED_RELEASE_TABLET | Freq: Every day | ORAL | Status: DC
  Administered 2017-07-23 – 2017-07-24 (×2): 81 mg via ORAL
  Filled 2017-07-23 (×3): qty 1

## 2017-07-23 MED ORDER — SODIUM CHLORIDE 0.9% FLUSH
3.0000 mL | Freq: Two times a day (BID) | INTRAVENOUS | Status: DC
  Administered 2017-07-23: 3 mL via INTRAVENOUS

## 2017-07-23 MED ORDER — SODIUM CHLORIDE 0.9 % IV SOLN: 250.0000 mL | INTRAVENOUS | Status: DC | PRN

## 2017-07-23 MED ORDER — RENA-VITE PO TABS
1.0000 | ORAL_TABLET | Freq: Every day | ORAL | Status: DC
  Administered 2017-07-23: 1 via ORAL
  Filled 2017-07-23: qty 1

## 2017-07-23 MED ORDER — AMLODIPINE BESYLATE 10 MG PO TABS
10.0000 mg | ORAL_TABLET | Freq: Every day | ORAL | Status: DC
  Administered 2017-07-23 – 2017-07-24 (×2): 10 mg via ORAL
  Filled 2017-07-23 (×2): qty 1

## 2017-07-23 MED ORDER — ONDANSETRON HCL 4 MG/2ML IJ SOLN: 4.0000 mg | Freq: Four times a day (QID) | INTRAMUSCULAR | Status: DC | PRN

## 2017-07-23 MED ORDER — SODIUM CHLORIDE 0.9% FLUSH: 3.0000 mL | INTRAVENOUS | Status: DC | PRN

## 2017-07-23 MED ORDER — ATORVASTATIN CALCIUM 40 MG PO TABS
40.0000 mg | ORAL_TABLET | Freq: Every day | ORAL | Status: DC
  Administered 2017-07-23: 40 mg via ORAL
  Filled 2017-07-23: qty 1

## 2017-07-23 MED ORDER — SENNOSIDES-DOCUSATE SODIUM 8.6-50 MG PO TABS: 1.0000 | ORAL_TABLET | Freq: Every evening | ORAL | Status: DC | PRN

## 2017-07-23 MED ORDER — FUROSEMIDE 80 MG PO TABS
80.0000 mg | ORAL_TABLET | Freq: Every day | ORAL | Status: DC
  Administered 2017-07-23: 80 mg via ORAL
  Filled 2017-07-23: qty 1

## 2017-07-23 MED ORDER — HYDRALAZINE HCL 20 MG/ML IJ SOLN: 10.0000 mg | INTRAMUSCULAR | Status: DC | PRN

## 2017-07-23 MED ORDER — ONDANSETRON HCL 4 MG PO TABS: 4.0000 mg | ORAL_TABLET | Freq: Four times a day (QID) | ORAL | Status: DC | PRN

## 2017-07-23 NOTE — Progress Notes (Signed)
Pt resting in chair. bp elevated 192/141, HR 87, Resp 22. Notified MD of pts BP and requested Med rec from Pharmacy. Cont to monitor pt.

## 2017-07-23 NOTE — H&P (Signed)
History and Physical    Angel [name] GYI:[id number] DOB: 1971/04/24 DOA: 07/23/2017  PCP: Gwenlyn Saran, MD   Patient coming from: Home  Chief Complaint: Need dialysis, SOB, swelling  HPI: Angel [name] is a 46 y.o. male with medical history significant for AAA status post repair, FSGS with ESRD on HD, hypertension, anemia, and chronic CHF, now presenting to the emergency department requesting dialysis and reporting mild dyspnea and swelling.  Patient reports that he is been on dialysis for about 1-1/2 years now in [place], recently moved to [place], and has had difficulty going to outpatient dialysis.  He has been coming to the ED to receive dialysis via catheter in right chest.  He reports some lower extremity swelling bilaterally over the past week and also notes mild exertional dyspnea.  He denies any recent fevers or chills and denies any chest pain.  Reports adherence with his blood pressure medications.  ED Course: Upon arrival to the ED, patient is found to be afebrile, saturating well on room air, hypertensive to 190/140, and with vitals otherwise stable.  Chemistry panel is notable for bicarbonate of 17, BUN of 121, and anion gap of 21.  CBC features a stable normocytic anemia with hemoglobin of 9.1.  Chest x-ray is notable for stable cardiomegaly without acute cardiopulmonary disease.  Patient was treated with 10 mg IV hydralazine and nitroglycerin infusion was ordered by the ED physician, but the patient refused this.  Nephrology was consulted by the ED physician and recommended medical admission. Patient has remained markedly hypertensive and will be admitted to the stepdown unit for ongoing evaluation and ESRD with volume overload, hypertensive urgency, and uremia.  Review of Systems:  All other systems reviewed and apart from HPI, are negative.  Past Medical History:  Diagnosis Date  . CHF (congestive heart failure) ([place])   . ESRD on dialysis ([place])   . FSGS  (focal segmental glomerulosclerosis)   . GERD (gastroesophageal reflux disease)   . HLD (hyperlipidemia)   . Hypertension   . Renal disorder   . Seizure ([place])   . Stroke (cerebrum) [facility])     Past Surgical History:  Procedure Laterality Date  . ABDOMINAL AORTIC ANEURYSM REPAIR    . KIDNEY SURGERY       reports that  has never smoked. he has never used smokeless tobacco. He reports that he does not drink alcohol or use drugs.  Allergies  Allergen Reactions  . Clonidine Derivatives Swelling    Leg swelling  . Hydralazine Hcl Swelling    Leg swelling     Family History  Problem Relation Age of Onset  . Hypertension Mother   . Kidney disease Mother   . Asthma Father   . Hypertension Father   . Kidney disease Father   . Hypertension Sister   . Heart disease Sister   . Kidney disease Sister   . Heart disease Brother   . Hypertension Brother   . COPD Brother      Prior to Admission medications   Medication Sig Start Date End Date Taking? Authorizing Provider  atorvastatin (LIPITOR) 40 MG tablet Take 40 mg at bedtime by mouth.   Yes [provider]  furosemide (LASIX) 80 MG tablet Take 80 mg at bedtime by mouth.   Yes [provider]  aspirin EC 81 MG tablet Take 81 mg daily by mouth.    [provider]  carvedilol (COREG) 25 MG tablet Take 25 mg 2 (two) times daily with  a meal by mouth.    [provider]  cloNIDine (CATAPRES) 0.2 MG tablet Take 0.2 mg 2 (two) times daily by mouth.    [provider]  hydrALAZINE (APRESOLINE) 50 MG tablet Take 100 mg by mouth daily.     [provider]  lisinopril (PRINIVIL,ZESTRIL) 40 MG tablet Take 40 mg daily by mouth.    [provider]    Physical Exam: Vitals:   07/23/17 0017 07/23/17 0313 07/23/17 0314  BP: (!) 189/137 (!) 206/142   Pulse: 92  88  Resp: 16    Temp: 98.3 F (36.8 C)    TempSrc: Oral    SpO2: 100%  99%  Weight: 68 kg (150 lb)    Height: 5\' 5"   (1.651 m)        Constitutional: NAD, calm, appears chronically-ill  Eyes: PERTLA, lids and conjunctivae normal ENMT: Mucous membranes are moist. Posterior pharynx clear of any exudate or lesions.   Neck: normal, supple, no masses, no thyromegaly Respiratory: clear to auscultation bilaterally, no wheezing, no crackles. Normal respiratory effort.   Cardiovascular: S1 & S2 heard, regular rate and rhythm. Bilateral ankle edema.  Abdomen: No distension, no tenderness, no masses palpated. Bowel sounds normal.  Musculoskeletal: no clubbing / cyanosis. No joint deformity upper and lower extremities.  Skin: No ulcers, wounds, or rash. Warm, dry, well-perfused. Neurologic: CN 2-12 grossly intact. Sensation intact, DTR normal. Strength 5/5 in all 4 limbs.  Psychiatric: Alert and oriented x 3. Calm.     Labs on Admission: I have personally reviewed following labs and imaging studies  CBC: Recent Labs  Lab 07/23/17 0024  WBC 7.8  HGB 9.1*  HCT 27.2*  MCV 83.4  PLT 016   Basic Metabolic Panel: Recent Labs  Lab 07/23/17 0024  NA 140  K 5.0  CL 102  CO2 17*  GLUCOSE 102*  BUN 121*  CREATININE 18.57*  CALCIUM 9.0   GFR: Estimated Creatinine Clearance: 4.3 mL/min (A) (by C-G formula based on SCr of 18.57 mg/dL (H)). Liver Function Tests: Recent Labs  Lab 07/23/17 0024  AST 44*  ALT 60  ALKPHOS 152*  BILITOT 1.1  PROT 6.4*  ALBUMIN 3.1*   Recent Labs  Lab 07/23/17 0024  LIPASE 45   No results for input(s): AMMONIA in the last 168 hours. Coagulation Profile: No results for input(s): INR, PROTIME in the last 168 hours. Cardiac Enzymes: No results for input(s): CKTOTAL, CKMB, CKMBINDEX, TROPONINI in the last 168 hours. BNP (last 3 results) No results for input(s): PROBNP in the last 8760 hours. HbA1C: No results for input(s): HGBA1C in the last 72 hours. CBG: No results for input(s): GLUCAP in the last 168 hours. Lipid Profile: No results for input(s): CHOL, HDL,  LDLCALC, TRIG, CHOLHDL, LDLDIRECT in the last 72 hours. Thyroid Function Tests: No results for input(s): TSH, T4TOTAL, FREET4, T3FREE, THYROIDAB in the last 72 hours. Anemia Panel: No results for input(s): VITAMINB12, FOLATE, FERRITIN, TIBC, IRON, RETICCTPCT in the last 72 hours. Urine analysis: No results found for: COLORURINE, APPEARANCEUR, LABSPEC, PHURINE, GLUCOSEU, HGBUR, BILIRUBINUR, KETONESUR, PROTEINUR, UROBILINOGEN, NITRITE, LEUKOCYTESUR Sepsis Labs: @LABRCNTIP (procalcitonin:4,lacticidven:4) )No results found for this or any previous visit (from the past 240 hour(s)).   Radiological Exams on Admission: Dg Chest 2 View  Result Date: 07/23/2017 CLINICAL DATA:  Hypertension. Dialysis patient. Shortness of breath. EXAM: CHEST  2 VIEW COMPARISON:  Most recent comparison 07/13/2017, additional priors. FINDINGS: Right-sided dialysis catheter remains in place with tip at the atrial caval  junction. Unchanged cardiomegaly and mediastinal contours. No pulmonary edema, focal airspace disease or pleural effusion. No pneumothorax. No acute osseous abnormalities. IMPRESSION: 1. Stable cardiomegaly. 2. No congestive failure or acute pulmonary process. Electronically Signed   By: Jeb Levering M.D.   On: 07/23/2017 04:14    EKG: Ordered and pending.   Assessment/Plan  1. ESRD; acidosis; uremia; volume-overload - Pt has hx of FSGS, now on HD via catheter in right chest  - Reports missing his outpatient sessions and presents to ED to request HD  - There is no hyperkalemia or respiratory distress; labs notable for BUN 121, bicarbonate 17, and AG 21; CXR without edema  - Nephrology is consulting and much appreciated  - SLIV, fluid-restrict diet, repeat chem panel later today if not dialyzed first    2. Hypertension with hypertensive urgency  - BP in 190/130 range in ED - Anticipate improvement with HD  - Continue clonidine, hydralazine, lisinopril, Lasix, and Coreg  - Use hydralazine IVP's  prn    3. Anemia  - Hgb is stable at 9.1 on admission   - No bleeding evident, likely secondary to ESRD     DVT prophylaxis: sq heparin  Code Status: Full  Family Communication: Discussed with patient Disposition Plan: Admit to SDU Consults called: Nephrology Admission status: Inpatient    Vianne Bulls, MD [facility] Pager [phone]  If 7PM-7AM, please contact night-coverage [email or web address] Password [facility]  07/23/2017, 4:27 AM

## 2017-07-23 NOTE — Progress Notes (Signed)
  P[facility] Back TEAM 1 - Stepdown/ICU TEAM  Angel [name]  GDJ:[id number] DOB: 12-17-1970 DOA: 07/23/2017 PCP: Gwenlyn Saran, MD    Brief Narrative:  46 y.o. male with history of AAA status post repair, FSGS with ESRD on HD, uncontrolled HTN, anemia, and chronic CHF who presented to the ED requesting dialysis and reporting mild dyspnea and swelling.  He had been coming to the ED to receive dialysis via a catheter in the right chest after leaving AMA in November 2018 despite being educated that he needed to stay to be seen by Vascular Surgery as part of the process to initiate outpt HD.  He was again admitted earlier this month, at which time a repeat Vasc Surgery consultation was ongoing when he chose to leave AMA again (07/16/18).    Subjective: Pt is seen for a f/u visit.    Assessment & Plan:  ESRD; acidosis; uremia; volume-overload - noncompliant w/ HD  Hypertension with hypertensive urgency  A consequence of noncompliance w/ volume control per HD - needs HD   Anemia of ESRD Hgb is stable at 9.1 on admission    DVT prophylaxis: SQ heparin  Code Status: FULL CODE Family Communication: no family present at time of exam  Disposition Plan:   Consultants:  Nephrology   Procedures: none  Antimicrobials:  none   Objective: Blood pressure (!) 195/149, pulse 87, temperature 97.9 F (36.6 C), temperature source Oral, resp. rate 14, height 5\' 5"  (1.651 m), weight 68.1 kg (150 lb 2.1 oz), SpO2 100 %.  Intake/Output Summary (Last 24 hours) at 07/23/2017 1209 Last data filed at 07/23/2017 6222 Gross per 24 hour  Intake 120 ml  Output -  Net 120 ml   Filed Weights   07/23/17 0017 07/23/17 0652  Weight: 68 kg (150 lb) 68.1 kg (150 lb 2.1 oz)    Examination: Pt was seen for a f/u visit.    CBC: Recent Labs  Lab 07/23/17 0024  WBC 7.8  HGB 9.1*  HCT 27.2*  MCV 83.4  PLT 979   Basic Metabolic Panel: Recent Labs  Lab 07/23/17 0024  NA 140  K 5.0  CL 102    CO2 17*  GLUCOSE 102*  BUN 121*  CREATININE 18.57*  CALCIUM 9.0   GFR: Estimated Creatinine Clearance: 4.3 mL/min (A) (by C-G formula based on SCr of 18.57 mg/dL (H)).  Liver Function Tests: Recent Labs  Lab 07/23/17 0024  AST 44*  ALT 60  ALKPHOS 152*  BILITOT 1.1  PROT 6.4*  ALBUMIN 3.1*   Recent Labs  Lab 07/23/17 0024  LIPASE 45    CBG: Recent Labs  Lab 07/23/17 0759  GLUCAP 110*    Scheduled Meds: . amLODipine  10 mg Oral Daily  . carvedilol  12.5 mg Oral BID WC  . heparin  5,000 Units Subcutaneous Q8H  . sodium chloride flush  3 mL Intravenous Q12H  . sodium chloride flush  3 mL Intravenous Q12H     LOS: 0 days   Time spent: No Charge  [name], MD [facility] Office  [phone] Pager - Text Page per Amion as per below:  On-Call/Text Page:      [name]      password [email or web address]  If 7PM-7AM, please contact night-coverage [email or web address] Password [email or web address] 07/23/2017, 12:09 PM

## 2017-07-23 NOTE — ED Triage Notes (Signed)
The pt is here to be dialyzed he reports that he gets diaklysis once a week until he can get into a clinic  He was last dialyzed last Friday  Nos sob no distress  He has a dialysis catheter in his chest

## 2017-07-23 NOTE — Consult Note (Signed)
Reason for Consult: Continuity of ESRD care, hypertensive urgency Referring Physician: Joette Catching M.D. [facility])  HPI:  46 year old African-American man with past medical history significant for hypertension, and FSGS and end-stage renal disease on hemodialysis who recently moved to [place] from the [place] area without making arrangements of outpatient dialysis unit. He also has a history of CVA, AAA repair, congestive heart failure and history of seizures. Because of his pattern of lack of adherence and signing out from the hospital [place] prior to arrangement of outpatient dialysis unit, he has not been able to be locally placed. He does inform me today that he has been accepted to the [facility] dialysis unit and is awaiting their confirmation phone call.  He presented to the emergency room today "because I needed dialysis" and was found to have hypertensive urgency and hyperkalemia with mild volume overload. He denies any fever or chills and had a recent cough without sputum production. Reports some mild nausea without any vomiting.  Past Medical History:  Diagnosis Date  . Chronic anemia   . Chronic combined systolic and diastolic CHF (congestive heart failure) ([place])   . ESRD on dialysis ([place])   . FSGS (focal segmental glomerulosclerosis)   . GERD (gastroesophageal reflux disease)   . HLD (hyperlipidemia)   . Hypertension   . NSTEMI (non-ST elevated myocardial infarction) ([place]) 07/2017   pt refused cath  . Renal disorder   . Seizure ([place])   . Stroke (cerebrum) [facility])     Past Surgical History:  Procedure Laterality Date  . ABDOMINAL AORTIC ANEURYSM REPAIR    . KIDNEY SURGERY      Family History  Problem Relation Age of Onset  . Hypertension Mother   . Kidney disease Mother   . Asthma Father   . Hypertension Father   . Kidney disease Father   . Hypertension Sister   . Heart disease Sister   . Kidney disease Sister   . Heart disease Brother   .  Hypertension Brother   . COPD Brother     Social History:  reports that  has never smoked. he has never used smokeless tobacco. He reports that he does not drink alcohol or use drugs.  Allergies:  Allergies  Allergen Reactions  . Clonidine Derivatives Swelling    Leg swelling  . Hydralazine Hcl Swelling    Leg swelling     Medications:  Scheduled: . amLODipine  10 mg Oral Daily  . aspirin EC  81 mg Oral Daily  . atorvastatin  40 mg Oral QHS  . carvedilol  12.5 mg Oral BID WC  . cholecalciferol  1,000 Units Oral BID  . furosemide  80 mg Oral QHS  . heparin  5,000 Units Subcutaneous Q8H  . lisinopril  40 mg Oral Daily  . multivitamin  1 tablet Oral QHS  . sevelamer carbonate  1,600 mg Oral TID WC  . sodium chloride flush  3 mL Intravenous Q12H    BMP Latest Ref Rng & Units 07/23/2017 07/23/2017 07/16/2017  Glucose 65 - 99 mg/dL 117(H) 102(H) 86  BUN 6 - 20 mg/dL 126(H) 121(H) 92(H)  Creatinine 0.61 - 1.24 mg/dL 18.28(H) 18.57(H) 14.43(H)  Sodium 135 - 145 mmol/L 139 140 136  Potassium 3.5 - 5.1 mmol/L 5.1 5.0 4.3  Chloride 101 - 111 mmol/L 102 102 98(L)  CO2 22 - 32 mmol/L 17(L) 17(L) 24  Calcium 8.9 - 10.3 mg/dL 9.0 9.0 8.4(L)   CBC Latest Ref Rng & Units 07/23/2017  07/14/2017 07/13/2017  WBC 4.0 - 10.5 K/uL 7.8 7.0 8.1  Hemoglobin 13.0 - 17.0 g/dL 9.1(L) 8.5(L) 9.5(L)  Hematocrit 39.0 - 52.0 % 27.2(L) 25.3(L) 27.6(L)  Platelets 150 - 400 K/uL 198 138(L) 154     Dg Chest 2 View  Result Date: 07/23/2017 CLINICAL DATA:  Hypertension. Dialysis patient. Shortness of breath. EXAM: CHEST  2 VIEW COMPARISON:  Most recent comparison 07/13/2017, additional priors. FINDINGS: Right-sided dialysis catheter remains in place with tip at the atrial caval junction. Unchanged cardiomegaly and mediastinal contours. No pulmonary edema, focal airspace disease or pleural effusion. No pneumothorax. No acute osseous abnormalities. IMPRESSION: 1. Stable cardiomegaly. 2. No congestive failure  or acute pulmonary process. Electronically Signed   By: Jeb Levering M.D.   On: 07/23/2017 04:14    Review of Systems  Constitutional: Positive for chills and malaise/fatigue. Negative for fever.  HENT: Negative.   Respiratory: Positive for cough and shortness of breath. Negative for sputum production and wheezing.   Cardiovascular: Positive for leg swelling. Negative for chest pain, palpitations and orthopnea.  Gastrointestinal: Positive for nausea. Negative for abdominal pain, heartburn and vomiting.  Genitourinary: Negative.   Musculoskeletal: Negative.   Skin: Positive for itching. Negative for rash.  Neurological: Positive for headaches. Negative for weakness.   Blood pressure (!) 172/136, pulse 88, temperature 98.4 F (36.9 C), temperature source Oral, resp. rate 16, height 5\' 5"  (1.651 m), weight 68.1 kg (150 lb 2.1 oz), SpO2 98 %. Physical Exam  Nursing note and vitals reviewed. Constitutional: He is oriented to person, place, and time. He appears well-developed and well-nourished. No distress.  HENT:  Head: Normocephalic and atraumatic.  Nose: Nose normal.  Mouth/Throat: Oropharynx is clear and moist.  Eyes: EOM are normal. Pupils are equal, round, and reactive to light. No scleral icterus.  Neck: Normal range of motion. Neck supple. JVD present. No thyromegaly present.  10 cm JVP  Cardiovascular: Normal rate, regular rhythm and normal heart sounds.  Respiratory: Effort normal. He has rales.  Fine rales bibasally. Right IJ TDC  GI: Soft. Bowel sounds are normal. There is no tenderness. There is no rebound.  Musculoskeletal: He exhibits edema.  2-3 plus lower extremity edema  Neurological: He is alert and oriented to person, place, and time. No cranial nerve deficit.  Skin: Skin is warm and dry. No rash noted. No erythema.    Assessment/Plan: 1.Hypertensive urgency: Secondary to lack of adherence with out-patient antihypertensive agents as well as missed hemodialysis  treatments. Resume antihypertensives and plan for hemodialysis later today. 2. End-stage renal disease: Inability to get outpatient dialysis after leaving the hospital recurrently [place] prior to completion of the dialysis placement process. Reports that he has been accepted to the outpatient dialysis unit in [place] and is awaiting a phone call from [employer]" their coordinator. 3. Anemia of chronic kidney disease: without overt loss, anemia likely perpetuated by inability to get ESA/iron. 4. Hyperkalemia: Mild, monitor with hemodialysis today. 5. Secondary hyperparathyroidism: Resume phosphorus binders and reconcile medications for vitamin D receptor analogue.  Rekisha Welling K. 07/23/2017, 4:55 PM

## 2017-07-23 NOTE — ED Provider Notes (Signed)
[facility] EMERGENCY DEPARTMENT Provider Note   CSN: [id number] Arrival date & time: 07/23/17  0004     History   Chief Complaint Chief Complaint  Patient presents with  . Vascular Access Problem    HPI Angel [name] is a 46 y.o. male.  HPI   46 year old male with history of end-stage renal disease currently on dialysis presenting requesting to be dialyze.  Patient was last seen in the ER on 07/13/17 when he was complaining of shortness of breath and weight gain.  He has missed dialysis for over a week at that time and also was found to have uncontrolled blood pressure.  Patient was admitted for dialysis however he signed out [place] on 12/10.  Patient states he is here today to get dialyzed.  He mentioned that he recently moved from [place] to [place] and have not had a designated dialysis placement.  He also report missing his recent dialysis appointment due to miscommunication with the coordinator.  He did try to find a place to dialyze but none are available due to his missed appointment.  Patient currently without any specific complaint.  He denies having any increased shortness of breath or increased fluid gain.  No fever headache nausea vomiting or diarrhea.  He admits that he has high blood pressure.  States that he takes his medication as prescribed but blood pressure never really fully controlled.  Past Medical History:  Diagnosis Date  . CHF (congestive heart failure) ([place])   . ESRD on dialysis ([place])   . FSGS (focal segmental glomerulosclerosis)   . GERD (gastroesophageal reflux disease)   . HLD (hyperlipidemia)   . Hypertension   . Renal disorder   . Seizure ([place])   . Stroke (cerebrum) [facility])     Patient Active Problem List   Diagnosis Date Noted  . ESRD (end stage renal disease) ([place]) 07/13/2017  . Hypertension 07/13/2017  . Non-ST elevation (NSTEMI) myocardial infarction ([place])   . Elevated troponin   . Hypertensive  emergency 06/24/2017  . Hypertensive urgency 06/20/2017  . Fluid overload 06/20/2017  . Hyperkalemia 06/20/2017  . GERD (gastroesophageal reflux disease) 06/20/2017  . Seizure ([place]) 06/20/2017  . Abnormal LFTs 06/20/2017  . Anemia in ESRD (end-stage renal disease) ([place]) 06/20/2017  . HLD (hyperlipidemia)   . Stroke (cerebrum) ([place])   . ESRD on dialysis ([place])   . Acute on chronic diastolic CHF (congestive heart failure) ([place])     Past Surgical History:  Procedure Laterality Date  . ABDOMINAL AORTIC ANEURYSM REPAIR    . KIDNEY SURGERY         Home Medications    Prior to Admission medications   Medication Sig Start Date End Date Taking? Authorizing Provider  aspirin EC 81 MG tablet Take 81 mg daily by mouth.    [provider]  atorvastatin (LIPITOR) 40 MG tablet Take 40 mg at bedtime by mouth.    [provider]  carvedilol (COREG) 25 MG tablet Take 25 mg 2 (two) times daily with a meal by mouth.    [provider]  cloNIDine (CATAPRES) 0.2 MG tablet Take 0.2 mg 2 (two) times daily by mouth.    [provider]  furosemide (LASIX) 80 MG tablet Take 80 mg at bedtime by mouth.    [provider]  hydrALAZINE (APRESOLINE) 50 MG tablet Take 100 mg by mouth daily.     [provider]  lisinopril (PRINIVIL,ZESTRIL) 40 MG tablet Take 40 mg  daily by mouth.    [provider]    Family History Family History  Problem Relation Age of Onset  . Hypertension Mother   . Kidney disease Mother   . Asthma Father   . Hypertension Father   . Kidney disease Father   . Hypertension Sister   . Heart disease Sister   . Kidney disease Sister   . Heart disease Brother   . Hypertension Brother   . COPD Brother     Social History Social History   Tobacco Use  . Smoking status: Never Smoker  . Smokeless tobacco: Never Used  Substance Use Topics  . Alcohol use: No    Frequency: Never  . Drug use: No     Allergies     Hydralazine hcl   Review of Systems Review of Systems  All other systems reviewed and are negative.    Physical Exam Updated Vital Signs BP (!) 189/137 (BP Location: Left Arm)   Pulse 92   Temp 98.3 F (36.8 C) (Oral)   Resp 16   Ht 5\' 5"  (1.651 m)   Wt 68 kg (150 lb)   SpO2 100%   BMI 24.96 kg/m   Physical Exam  Constitutional: He is oriented to person, place, and time. He appears well-developed and well-nourished. No distress.  HENT:  Head: Atraumatic.  Eyes: Conjunctivae are normal.  Neck: Neck supple. No JVD present.  Cardiovascular: Normal rate and regular rhythm.  Pulmonary/Chest: Effort normal and breath sounds normal.  Abdominal: Soft. He exhibits no distension. There is no tenderness.  Musculoskeletal: He exhibits no edema.  Neurological: He is alert and oriented to person, place, and time.  Skin: No rash noted.  Psychiatric: He has a normal mood and affect.  Nursing note and vitals reviewed.    ED Treatments / Results  Labs (all labs ordered are listed, but only abnormal results are displayed) Labs Reviewed  COMPREHENSIVE METABOLIC PANEL - Abnormal; Notable for the following components:      Result Value   CO2 17 (*)    Glucose, Bld 102 (*)    BUN 121 (*)    Creatinine, Ser 18.57 (*)    Total Protein 6.4 (*)    Albumin 3.1 (*)    AST 44 (*)    Alkaline Phosphatase 152 (*)    GFR calc non Af Amer 3 (*)    GFR calc Af Amer 3 (*)    Anion gap 21 (*)    All other components within normal limits  CBC - Abnormal; Notable for the following components:   RBC 3.26 (*)    Hemoglobin 9.1 (*)    HCT 27.2 (*)    All other components within normal limits  LIPASE, BLOOD    EKG  EKG Interpretation None       Radiology Dg Chest 2 View  Result Date: 07/23/2017 CLINICAL DATA:  Hypertension. Dialysis patient. Shortness of breath. EXAM: CHEST  2 VIEW COMPARISON:  Most recent comparison 07/13/2017, additional priors. FINDINGS: Right-sided dialysis  catheter remains in place with tip at the atrial caval junction. Unchanged cardiomegaly and mediastinal contours. No pulmonary edema, focal airspace disease or pleural effusion. No pneumothorax. No acute osseous abnormalities. IMPRESSION: 1. Stable cardiomegaly. 2. No congestive failure or acute pulmonary process. Electronically Signed   By: Jeb Levering M.D.   On: 07/23/2017 04:14    Procedures Procedures (including critical care time)  Medications Ordered in ED Medications  nitroGLYCERIN 50 mg in dextrose 5 %  250 mL (0.2 mg/mL) infusion (0 mcg/min Intravenous Refused 07/23/17 0342)  hydrALAZINE (APRESOLINE) injection 10 mg (10 mg Intravenous Given 07/23/17 0341)     Initial Impression / Assessment and Plan / ED Course  I have reviewed the triage vital signs and the nursing notes.  Pertinent labs & imaging results that were available during my care of the patient were reviewed by me and considered in my medical decision making (see chart for details).     BP (!) 206/142   Pulse 88   Temp 98.3 F (36.8 C) (Oral)   Resp 16   Ht 5\' 5"  (1.651 m)   Wt 68 kg (150 lb)   SpO2 99%   BMI 24.96 kg/m    Final Clinical Impressions(s) / ED Diagnoses   Final diagnoses:  Uncontrolled hypertension  Dialysis patient, noncompliant [facility])    ED Discharge Orders    None     2:48 AM Patient here requesting to be dialyzed after missing dialysis for the past week.  He is in no acute discomfort.  He is found to be hypertensive with a blood pressure of 189/137.  He has been evaluated for his hypertensive urgency in the past likely secondary to his kidney disease and missing dialysis.  I have ordered nitro drips and hydralazine for his BP, however pt refused nitro drips stating it gives him headache.  Care discussed with DR. Rancour.    4:17 AM Appreciate consultation from nephrologist Dr. Melvia Heaps who agrees to be involve in pt care if he is admitted.  I have consulted [facility] Dr.  Myna Hidalgo who agrees to admit pt for uncontrolled hypertension and needing dialysis.  Pt is aware of plan.     Domenic Moras, PA-C 07/23/17 [id number]    Ezequiel Essex, MD 07/23/17 [phone]

## 2017-07-23 NOTE — Progress Notes (Signed)
Patient arrived to floor in stable condition accompanied by ED staff.  Patient oriented to room and call bell and phone in reach.  Skin assessment completed by myself and S. Columbres, Therapist, sports.  BP continues to be elevated.  RN will continue to monitor and notify MD of any changed.  P.J. Linus Mako, RN

## 2017-07-24 ENCOUNTER — Ambulatory Visit: Payer: Medicare Other | Admitting: Physician Assistant

## 2017-07-24 LAB — RENAL FUNCTION PANEL
ALBUMIN: 3.1 g/dL — AB (ref 3.5–5.0)
ANION GAP: 18 — AB (ref 5–15)
BUN: 140 mg/dL — AB (ref 6–20)
CALCIUM: 8.6 mg/dL — AB (ref 8.9–10.3)
CO2: 20 mmol/L — AB (ref 22–32)
Chloride: 100 mmol/L — ABNORMAL LOW (ref 101–111)
Creatinine, Ser: 19.87 mg/dL — ABNORMAL HIGH (ref 0.61–1.24)
GFR calc Af Amer: 3 mL/min — ABNORMAL LOW (ref >60)
GFR calc non Af Amer: 2 mL/min — ABNORMAL LOW (ref >60)
GLUCOSE: 102 mg/dL — AB (ref 65–99)
PHOSPHORUS: 8.4 mg/dL — AB (ref 2.5–4.6)
Potassium: 4.6 mmol/L (ref 3.5–5.1)
SODIUM: 138 mmol/L (ref 135–145)

## 2017-07-24 LAB — CBC
HEMATOCRIT: 24.8 % — AB (ref 39.0–52.0)
HEMOGLOBIN: 8.4 g/dL — AB (ref 13.0–17.0)
MCH: 27.5 pg (ref 26.0–34.0)
MCHC: 33.9 g/dL (ref 30.0–36.0)
MCV: 81.3 fL (ref 78.0–100.0)
Platelets: 187 10*3/uL (ref 150–400)
RBC: 3.05 MIL/uL — ABNORMAL LOW (ref 4.22–5.81)
RDW: 15.2 % (ref 11.5–15.5)
WBC: 7.6 10*3/uL (ref 4.0–10.5)

## 2017-07-24 LAB — HEPATITIS B SURFACE ANTIGEN: Hepatitis B Surface Ag: NEGATIVE

## 2017-07-24 LAB — GLUCOSE, CAPILLARY: Glucose-Capillary: 119 mg/dL — ABNORMAL HIGH (ref 65–99)

## 2017-07-24 MED ORDER — ALTEPLASE 2 MG IJ SOLR: 2.0000 mg | Freq: Once | INTRAMUSCULAR | Status: DC | PRN

## 2017-07-24 MED ORDER — LIDOCAINE-PRILOCAINE 2.5-2.5 % EX CREA: 1.0000 "application " | TOPICAL_CREAM | CUTANEOUS | Status: DC | PRN

## 2017-07-24 MED ORDER — PENTAFLUOROPROP-TETRAFLUOROETH EX AERO: 1.0000 "application " | INHALATION_SPRAY | CUTANEOUS | Status: DC | PRN

## 2017-07-24 MED ORDER — SODIUM CHLORIDE 0.9 % IV SOLN
100.0000 mL | INTRAVENOUS | Status: DC | PRN
Start: 2017-07-24 — End: 2017-07-24

## 2017-07-24 MED ORDER — HEPARIN SODIUM (PORCINE) 1000 UNIT/ML DIALYSIS: 40.0000 [IU]/kg | INTRAMUSCULAR | Status: DC | PRN

## 2017-07-24 MED ORDER — LIDOCAINE HCL (PF) 1 % IJ SOLN: 5.0000 mL | INTRAMUSCULAR | Status: DC | PRN

## 2017-07-24 MED ORDER — SODIUM CHLORIDE 0.9 % IV SOLN: 100.0000 mL | INTRAVENOUS | Status: DC | PRN

## 2017-07-24 MED ORDER — HEPARIN SODIUM (PORCINE) 1000 UNIT/ML DIALYSIS: 1000.0000 [IU] | INTRAMUSCULAR | Status: DC | PRN

## 2017-07-24 MED ORDER — LABETALOL HCL 5 MG/ML IV SOLN
10.0000 mg | INTRAVENOUS | Status: DC | PRN
  Administered 2017-07-24: 10 mg via INTRAVENOUS
  Filled 2017-07-24: qty 4

## 2017-07-24 NOTE — Progress Notes (Signed)
MD notified  

## 2017-07-24 NOTE — Procedures (Signed)
Patient seen on Hemodialysis. QB 400, UF goal 5.5L Treatment adjusted as needed.  Elmarie Shiley MD [facility]. Office # [phone] Pager # [phone] 9:51 AM

## 2017-07-24 NOTE — Progress Notes (Signed)
Pt. Deciding to leave AMA. Educated about the risks. MD made aware of pt. Decision. He states he is going to the cardiologist tomorrow and is going to dialysis in [place] tomorrow.

## 2017-07-24 NOTE — Progress Notes (Signed)
Patient ID: Angel [name], male   DOB: [birth year], 46 y.o.   MRN: [id number]  [facility] Progress Note   Assessment/ Plan:   1.Hypertensive urgency: Re-iterated adherence with anti-HTN Rx and HD 2. End-stage renal disease: Ongoing HD at this time after a week without HD. Reports that he has been accepted to the outpatient dialysis unit in [place] and is awaiting a phone call from [employer] their coordinator. 3. Anemia of chronic kidney disease: without overt loss, anemia likely perpetuated by inability to get ESA/iron. 4. Hyperkalemia: Mild, monitor with hemodialysis today. 5. Secondary hyperparathyroidism: Resume phosphorus binders and reconcile medications for vitamin D receptor analogue.  Subjective:   Reports an uneventful night and disappointed that he did not get HD yesterday.   Objective:   BP (!) 169/105   Pulse 80   Temp 98.1 F (36.7 C) (Oral)   Resp 18   Ht 5\' 5"  (1.651 m)   Wt 69.8 kg (153 lb 14.1 oz)   SpO2 99%   BMI 25.61 kg/m   Physical Exam: [id number] resting on HD CVS: Pulse regular rhythm and normal rate, s1 and s2 normal Resp: fine rales bibasally, no rhonchi VOH:KGOV, flat, NT Ext:3+ LE edema  Labs: BMET Recent Labs  Lab 07/23/17 0024 07/23/17 1152 07/24/17 0733  NA 140 139 138  K 5.0 5.1 4.6  CL 102 102 100*  CO2 17* 17* 20*  GLUCOSE 102* 117* 102*  BUN 121* 126* 140*  CREATININE 18.57* 18.28* 19.87*  CALCIUM 9.0 9.0 8.6*  PHOS  --   --  8.4*   CBC Recent Labs  Lab 07/23/17 0024 07/24/17 0734  WBC 7.8 7.6  HGB 9.1* 8.4*  HCT 27.2* 24.8*  MCV 83.4 81.3  PLT 198 187   Medications:    . amLODipine  10 mg Oral Daily  . aspirin EC  81 mg Oral Daily  . atorvastatin  40 mg Oral QHS  . carvedilol  12.5 mg Oral BID WC  . cholecalciferol  1,000 Units Oral BID  . furosemide  80 mg Oral QHS  . heparin  5,000 Units Subcutaneous Q8H  . lisinopril  40 mg Oral Daily  . multivitamin  1 tablet Oral QHS  .  sevelamer carbonate  1,600 mg Oral TID WC  . sodium chloride flush  3 mL Intravenous Q12H   Elmarie Shiley, MD 07/24/2017, 9:47 AM

## 2017-07-30 ENCOUNTER — Observation Stay (HOSPITAL_COMMUNITY)
Admission: EM | Admit: 2017-07-30 | Discharge: 2017-07-30 | Disposition: A | Discharge status: Home / Self Care | Payer: Medicare Other | Attending: Family Medicine | Admitting: Family Medicine

## 2017-07-30 ENCOUNTER — Encounter (HOSPITAL_COMMUNITY): Payer: Self-pay | Admitting: *Deleted

## 2017-07-30 ENCOUNTER — Other Ambulatory Visit: Payer: Self-pay

## 2017-07-30 DIAGNOSIS — I10 Essential (primary) hypertension: Secondary | ICD-10-CM | POA: Diagnosis not present

## 2017-07-30 DIAGNOSIS — I132 Hypertensive heart and chronic kidney disease with heart failure and with stage 5 chronic kidney disease, or end stage renal disease: Principal | ICD-10-CM | POA: Insufficient documentation

## 2017-07-30 DIAGNOSIS — Z8673 Personal history of transient ischemic attack (TIA), and cerebral infarction without residual deficits: Secondary | ICD-10-CM | POA: Insufficient documentation

## 2017-07-30 DIAGNOSIS — N186 End stage renal disease: Secondary | ICD-10-CM

## 2017-07-30 DIAGNOSIS — E8729 Other acidosis: Secondary | ICD-10-CM | POA: Diagnosis present

## 2017-07-30 DIAGNOSIS — R600 Localized edema: Secondary | ICD-10-CM

## 2017-07-30 DIAGNOSIS — Z9114 Patient's other noncompliance with medication regimen: Secondary | ICD-10-CM | POA: Insufficient documentation

## 2017-07-30 DIAGNOSIS — Z7982 Long term (current) use of aspirin: Secondary | ICD-10-CM | POA: Diagnosis not present

## 2017-07-30 DIAGNOSIS — E8770 Fluid overload, unspecified: Secondary | ICD-10-CM | POA: Diagnosis present

## 2017-07-30 DIAGNOSIS — E872 Acidosis: Secondary | ICD-10-CM | POA: Insufficient documentation

## 2017-07-30 DIAGNOSIS — I160 Hypertensive urgency: Secondary | ICD-10-CM | POA: Insufficient documentation

## 2017-07-30 DIAGNOSIS — D631 Anemia in chronic kidney disease: Secondary | ICD-10-CM | POA: Diagnosis present

## 2017-07-30 DIAGNOSIS — E785 Hyperlipidemia, unspecified: Secondary | ICD-10-CM | POA: Diagnosis not present

## 2017-07-30 DIAGNOSIS — D696 Thrombocytopenia, unspecified: Secondary | ICD-10-CM | POA: Diagnosis present

## 2017-07-30 DIAGNOSIS — Z79899 Other long term (current) drug therapy: Secondary | ICD-10-CM | POA: Diagnosis not present

## 2017-07-30 DIAGNOSIS — Z992 Dependence on renal dialysis: Secondary | ICD-10-CM | POA: Diagnosis not present

## 2017-07-30 DIAGNOSIS — I5042 Chronic combined systolic (congestive) and diastolic (congestive) heart failure: Secondary | ICD-10-CM | POA: Insufficient documentation

## 2017-07-30 DIAGNOSIS — E875 Hyperkalemia: Secondary | ICD-10-CM | POA: Diagnosis not present

## 2017-07-30 DIAGNOSIS — K219 Gastro-esophageal reflux disease without esophagitis: Secondary | ICD-10-CM | POA: Diagnosis not present

## 2017-07-30 DIAGNOSIS — I252 Old myocardial infarction: Secondary | ICD-10-CM | POA: Insufficient documentation

## 2017-07-30 DIAGNOSIS — Z8679 Personal history of other diseases of the circulatory system: Secondary | ICD-10-CM | POA: Diagnosis not present

## 2017-07-30 LAB — CBC
HEMATOCRIT: 24.3 % — AB (ref 39.0–52.0)
Hemoglobin: 8.1 g/dL — ABNORMAL LOW (ref 13.0–17.0)
MCH: 27.5 pg (ref 26.0–34.0)
MCHC: 33.3 g/dL (ref 30.0–36.0)
MCV: 82.4 fL (ref 78.0–100.0)
Platelets: 135 10*3/uL — ABNORMAL LOW (ref 150–400)
RBC: 2.95 MIL/uL — ABNORMAL LOW (ref 4.22–5.81)
RDW: 15 % (ref 11.5–15.5)
WBC: 6.3 10*3/uL (ref 4.0–10.5)

## 2017-07-30 LAB — COMPREHENSIVE METABOLIC PANEL
ALBUMIN: 3 g/dL — AB (ref 3.5–5.0)
ALK PHOS: 158 U/L — AB (ref 38–126)
ALT: 49 U/L (ref 17–63)
AST: 39 U/L (ref 15–41)
Anion gap: 18 — ABNORMAL HIGH (ref 5–15)
BILIRUBIN TOTAL: 1.2 mg/dL (ref 0.3–1.2)
BUN: 132 mg/dL — AB (ref 6–20)
CALCIUM: 9.1 mg/dL (ref 8.9–10.3)
CO2: 17 mmol/L — ABNORMAL LOW (ref 22–32)
CREATININE: 19.03 mg/dL — AB (ref 0.61–1.24)
Chloride: 104 mmol/L (ref 101–111)
GFR calc Af Amer: 3 mL/min — ABNORMAL LOW (ref >60)
GFR calc non Af Amer: 3 mL/min — ABNORMAL LOW (ref >60)
GLUCOSE: 145 mg/dL — AB (ref 65–99)
Potassium: 5.7 mmol/L — ABNORMAL HIGH (ref 3.5–5.1)
Sodium: 139 mmol/L (ref 135–145)
TOTAL PROTEIN: 6.4 g/dL — AB (ref 6.5–8.1)

## 2017-07-30 LAB — TYPE AND SCREEN
ABO/RH(D): A POS
Antibody Screen: NEGATIVE

## 2017-07-30 MED ORDER — IPRATROPIUM-ALBUTEROL 0.5-2.5 (3) MG/3ML IN SOLN
3.0000 mL | RESPIRATORY_TRACT | Status: DC | PRN
Start: 2017-07-30 — End: 2017-07-30

## 2017-07-30 MED ORDER — ALTEPLASE 2 MG IJ SOLR: 2.0000 mg | Freq: Once | INTRAMUSCULAR | Status: DC | PRN

## 2017-07-30 MED ORDER — ASPIRIN EC 81 MG PO TBEC
81.0000 mg | DELAYED_RELEASE_TABLET | Freq: Every day | ORAL | Status: DC
Start: 2017-07-30 — End: 2017-07-30

## 2017-07-30 MED ORDER — ACETAMINOPHEN 325 MG PO TABS
ORAL_TABLET | ORAL | Status: AC
  Administered 2017-07-30: 650 mg via ORAL
  Filled 2017-07-30: qty 2

## 2017-07-30 MED ORDER — ONDANSETRON HCL 4 MG PO TABS: 4.0000 mg | ORAL_TABLET | Freq: Four times a day (QID) | ORAL | Status: DC | PRN

## 2017-07-30 MED ORDER — SEVELAMER CARBONATE 800 MG PO TABS
1600.0000 mg | ORAL_TABLET | Freq: Three times a day (TID) | ORAL | Status: DC
  Administered 2017-07-30: 1600 mg via ORAL
  Filled 2017-07-30 (×4): qty 2

## 2017-07-30 MED ORDER — SODIUM CHLORIDE 0.9 % IV SOLN: 100.0000 mL | INTRAVENOUS | Status: DC | PRN

## 2017-07-30 MED ORDER — HYDRALAZINE HCL 20 MG/ML IJ SOLN: 10.0000 mg | Freq: Once | INTRAMUSCULAR | Status: DC

## 2017-07-30 MED ORDER — HEPARIN SODIUM (PORCINE) 1000 UNIT/ML DIALYSIS: 1000.0000 [IU] | INTRAMUSCULAR | Status: DC | PRN

## 2017-07-30 MED ORDER — HEPARIN SODIUM (PORCINE) 5000 UNIT/ML IJ SOLN: 5000.0000 [IU] | Freq: Three times a day (TID) | INTRAMUSCULAR | Status: DC

## 2017-07-30 MED ORDER — ACETAMINOPHEN 325 MG PO TABS
650.0000 mg | ORAL_TABLET | Freq: Four times a day (QID) | ORAL | Status: DC | PRN
  Administered 2017-07-30: 650 mg via ORAL

## 2017-07-30 MED ORDER — HYDRALAZINE HCL 20 MG/ML IJ SOLN: 10.0000 mg | INTRAMUSCULAR | Status: DC | PRN

## 2017-07-30 MED ORDER — SEVELAMER CARBONATE 800 MG PO TABS
1600.0000 mg | ORAL_TABLET | ORAL | Status: DC | PRN
  Filled 2017-07-30: qty 2

## 2017-07-30 MED ORDER — CLONIDINE HCL 0.1 MG PO TABS: 0.1000 mg | ORAL_TABLET | Freq: Once | ORAL | Status: DC

## 2017-07-30 MED ORDER — LABETALOL HCL 5 MG/ML IV SOLN
10.0000 mg | Freq: Once | INTRAVENOUS | Status: AC
  Administered 2017-07-30: 10 mg via INTRAVENOUS
  Filled 2017-07-30: qty 4

## 2017-07-30 MED ORDER — LISINOPRIL 20 MG PO TABS
40.0000 mg | ORAL_TABLET | Freq: Every day | ORAL | Status: DC
Start: 2017-07-30 — End: 2017-07-30

## 2017-07-30 MED ORDER — CARVEDILOL 12.5 MG PO TABS: 25.0000 mg | ORAL_TABLET | Freq: Two times a day (BID) | ORAL | Status: DC

## 2017-07-30 MED ORDER — SODIUM POLYSTYRENE SULFONATE 15 GM/60ML PO SUSP
15.0000 g | Freq: Once | ORAL | Status: AC
  Administered 2017-07-30: 15 g via ORAL
  Filled 2017-07-30: qty 60

## 2017-07-30 MED ORDER — NITROGLYCERIN 2 % TD OINT
1.0000 [in_us] | TOPICAL_OINTMENT | Freq: Once | TRANSDERMAL | Status: AC
  Administered 2017-07-30: 1 [in_us] via TOPICAL
  Filled 2017-07-30: qty 1

## 2017-07-30 MED ORDER — LABETALOL HCL 5 MG/ML IV SOLN: 10.0000 mg | INTRAVENOUS | Status: DC | PRN

## 2017-07-30 MED ORDER — LIDOCAINE HCL (PF) 1 % IJ SOLN: 5.0000 mL | INTRAMUSCULAR | Status: DC | PRN

## 2017-07-30 MED ORDER — ACETAMINOPHEN 650 MG RE SUPP: 650.0000 mg | Freq: Four times a day (QID) | RECTAL | Status: DC | PRN

## 2017-07-30 MED ORDER — ONDANSETRON HCL 4 MG/2ML IJ SOLN: 4.0000 mg | Freq: Four times a day (QID) | INTRAMUSCULAR | Status: DC | PRN

## 2017-07-30 MED ORDER — ATORVASTATIN CALCIUM 40 MG PO TABS: 40.0000 mg | ORAL_TABLET | Freq: Every day | ORAL | Status: DC

## 2017-07-30 MED ORDER — PENTAFLUOROPROP-TETRAFLUOROETH EX AERO: 1.0000 "application " | INHALATION_SPRAY | CUTANEOUS | Status: DC | PRN

## 2017-07-30 MED ORDER — LIDOCAINE-PRILOCAINE 2.5-2.5 % EX CREA: 1.0000 "application " | TOPICAL_CREAM | CUTANEOUS | Status: DC | PRN

## 2017-07-30 NOTE — ED Provider Notes (Signed)
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  It appears patient was last dialyzed on December 17.  He states that his blood pressure is elevated today which is chronic for him.  He reports compliance with his Coreg and lisinopril.  It appears during his last admission patient left AGAINST MEDICAL ADVICE prior to outpatient dialysis being set up for him.  He states that he has been referred to [name] but that he needed a TB test first.  He has been talking to [employer] the Financial planner there who unfortunately is gone until January 7.  He has not been able to schedule outpatient dialysis.  ROS: See HPI Constitutional: no fever  Eyes: no drainage  ENT: no runny nose   Cardiovascular:  no chest pain  Resp: no SOB  GI: no vomiting GU: no dysuria Integumentary: no rash  Allergy: no hives  Musculoskeletal: no leg swelling  Neurological: no slurred speech ROS otherwise negative  PAST MEDICAL HISTORY/PAST SURGICAL HISTORY:  Past Medical History:  Diagnosis Date  . Chronic anemia   . Chronic combined systolic and diastolic CHF (congestive heart failure) ([place])   . ESRD on dialysis ([place])   . FSGS (focal segmental glomerulosclerosis)   . GERD (gastroesophageal reflux disease)   . HLD (hyperlipidemia)   . Hypertension   . NSTEMI (non-ST elevated myocardial infarction) ([place]) 07/2017   pt refused cath  . Renal disorder   . Seizure ([place])   . Stroke (cerebrum) [facility])     MEDICATIONS:  Prior to Admission medications   Medication Sig Start Date End Date Taking? Authorizing Provider  aspirin EC 81 MG tablet Take 81 mg daily by mouth.     [provider]  atorvastatin (LIPITOR) 40 MG tablet Take 40 mg at bedtime by mouth.    [provider]  carvedilol (COREG) 25 MG tablet Take 25 mg 2 (two) times daily with a meal by mouth.    [provider]  cholecalciferol (VITAMIN D) 1000 units tablet Take 1,000 Units by mouth 2 (two) times daily.    [provider]  furosemide (LASIX) 80 MG tablet Take 80 mg at bedtime by mouth.    [provider]  Lactobacillus Rhamnosus, GG, (CULTURELLE) CAPS Take 1 capsule by mouth daily.    [provider]  lisinopril (PRINIVIL,ZESTRIL) 40 MG tablet Take 40 mg daily by mouth.    [provider]  multivitamin (RENA-VIT) TABS tablet Take 1 tablet by mouth 2 (two) times daily.    [provider]  sevelamer carbonate (RENVELA) 800 MG tablet Take 1,600 mg by mouth See admin instructions. Take 2 tablets (1600 mg) three times daily with meals and with snacks    [provider]    ALLERGIES:  Allergies  Allergen Reactions  . Clonidine Derivatives Swelling    Leg swelling  . Hydralazine Hcl Swelling    Leg swelling     SOCIAL HISTORY:  Social History   Tobacco Use  . Smoking status: Never Smoker  . Smokeless tobacco: Never Used  Substance Use Topics  . Alcohol use: No    Frequency: Never    FAMILY HISTORY: Family History  Problem Relation Age of Onset  . Hypertension Mother   . Kidney disease Mother   . Asthma Father   . Hypertension Father   . Kidney disease Father   . Hypertension Sister   . Heart disease Sister   . Kidney disease Sister   . Heart disease Brother   . Hypertension Brother   . COPD Brother     EXAM: BP (!) 175/129 (BP Location: Right Arm)   Pulse 80   Temp (!) 97.5 F (36.4 C) (Oral)   Resp 18   Ht 5\' 5"  (1.651 m)   Wt 51.7 kg (114 lb)   SpO2 100%   BMI 18.97 kg/m  CONSTITUTIONAL: Alert and oriented and responds appropriately to questions. Well-appearing; well-nourished HEAD:  Normocephalic EYES: Conjunctivae clear, pupils appear equal, EOMI ENT: normal nose; moist mucous membranes NECK: Supple, no meningismus, no nuchal rigidity, no LAD  CARD: RRR; S1 and S2 appreciated; no murmurs, no clicks, no rubs, no gallops CHEST: Patient has a dialysis catheter in the right chest wall without surrounding warmth, erythema, drainage or tenderness RESP: Normal chest excursion without splinting or tachypnea; breath sounds clear and equal bilaterally; no wheezes, no rhonchi, no rales, no hypoxia or respiratory distress, speaking full sentences ABD/GI: Normal bowel sounds; non-distended; soft, non-tender, no rebound, no guarding, no peritoneal signs, no hepatosplenomegaly BACK:  The back appears normal and is non-tender to palpation, there is no CVA tenderness EXT: Normal ROM in all joints; non-tender to palpation; no edema; normal capillary refill; no cyanosis, no calf tenderness or swelling    SKIN: Normal color for age and race; warm; no rash NEURO: Moves all extremities equally PSYCH: The patient's mood and manner are appropriate. Grooming and personal hygiene are appropriate.  MEDICAL DECISION MAKING: Patient here requesting dialysis.  Has some pedal edema but no shortness of breath and his clear.  No hypoxia.  Potassium mildly elevated without EKG changes compared to previous.  Will give Kayexalate.  Patient is extremely hypertensive which makes me concerned because he has had a AAA repair.  He continues to state that this is baseline for him and difficult to control.  He reports compliance with lisinopril and Coreg.  He states he is supposed to take clonidine but it causes him anxiety.  We will give him clonidine here as well as a dose of hydralazine.  He reports that these cause him swelling but have encouraged him to take them.  Patient states that he does not have an 8 outpatient dialysis center.  He has been referred to [name] in [place].  He states that he was  told he needed a TB test first.  He states the administrative nurse Larene Beach who is helping get things arranged for him is gone until January 7.  He was last dialyzed in the hospital 1 week ago.  ED PROGRESS: 4:15 AM  D/w Dr. Justin Mend with nephrology.  He recommends admission to the hospital given patient's history of hypertension with AAA repair.  Patient refusing clonidine and hydralazine.  Dr. Justin Mend recommends Nitropaste.  Will also give dose of IV labetalol.  We will give him Kayexalate as well.  Will discuss with medicine for admission.  Nephrology will put him on the list for dialysis today.  Patient has been updated with this plan.  4:43 AM Discussed patient's case with hospitalist, Dr. Tamala Julian.  I have recommended admission and patient (and family if present) agree with this plan. Admitting physician will place admission orders.  I reviewed all nursing notes, vitals, pertinent previous records, EKGs, lab and urine results, imaging (as available).      EKG Interpretation  Date/Time:  Monday July 30 2017 03:13:41 EST Ventricular Rate:  76 PR Interval:    QRS Duration: 91 QT Interval:  431 QTC Calculation: 485 R Axis:   12 Text Interpretation:  Sinus rhythm Left ventricular hypertrophy Nonspecific T abnormalities, lateral leads Borderline prolonged QT interval No significant change since last tracing Confirmed by Ward, Cyril Mourning [phone]) on 07/30/2017 3:36:54 AM         Ward, Delice Bison, DO 07/30/17 [phone]

## 2017-07-30 NOTE — Progress Notes (Signed)
Dialysis treatment completed.  4000 mL ultrafiltrated.  3500 mL net fluid removal.  Patient status unchanged. Lung sounds diminished to ausculation in all fields. Generalized edema. Cardiac: NSR.  Cleansed RIJ catheter with chlorhexidine.  Disconnected lines and flushed ports with saline per protocol.  Ports locked with heparin and capped per protocol.    Patient D/C home per self post tx.

## 2017-07-30 NOTE — Progress Notes (Signed)
Tx time extended by 45 minutes d/t HD machine malfunction requiring new HD machine to be used for remainder of tx.  Will continue to monitor.

## 2017-07-30 NOTE — Discharge Summary (Signed)
Angel [name], is a 46 y.o. male  DOB [birth year]  MRN [id number].  Admission date:  07/30/2017  Admitting Physician  Norval Morton, MD  Discharge Date:  07/30/2017   Primary MD  Gwenlyn Saran, MD  Recommendations for primary care physician for things to follow:   Left AMA   Admission Diagnosis  needs dialysis   Discharge Diagnosis  needs dialysis    Principal Problem:   Fluid overload Active Problems:   Hyperkalemia   Anemia in ESRD (end-stage renal disease) ([place])   End-stage renal disease needing dialysis ([place])   Metabolic acidosis, increased anion gap   Thrombocytopenia ([name])      Past Medical History:  Diagnosis Date  . Chronic anemia   . Chronic combined systolic and diastolic CHF (congestive heart failure) ([place])   . ESRD on dialysis ([place])   . FSGS (focal segmental glomerulosclerosis)   . GERD (gastroesophageal reflux disease)   . HLD (hyperlipidemia)   . Hypertension   . NSTEMI (non-ST elevated myocardial infarction) ([place]) 07/2017   pt refused cath  . Renal disorder   . Seizure ([place])   . Stroke (cerebrum) [facility])     Past Surgical History:  Procedure Laterality Date  . ABDOMINAL AORTIC ANEURYSM REPAIR    . KIDNEY SURGERY       HPI  from the history and physical done on the day of admission:   Please note that the patient left [place] on 07/30/2017 immediately after hemodialysis session was completed   Angel [name] is a 46 y.o. 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  Who reports moving from [place] to [place] and is having difficulty in getting outpatient dialysis.  He has been coming here on a weekly basis to receive dialysis.  He reports having swelling secondary to clonidine and hydralazine.  Denies having any  significant fever, chills, shortness of breath, or chest pain.  Admits to complaints of diarrhea having anywhere from 8-9 bowel movements per day for which he took Pepto-Bismol and noted complaints of some black stool.  He reports following his surgery to repair his AAA in 02/2017 that he cannot lay down due to discomfort.  ED Course: Upon admission into the emergency department patient was noted to be afebrile, pulse 71-80, respirations 14-18, blood pressure 165/128-175/129, and O2 saturations 100% on room air.  Labs revealed WBC 6.3, hemoglobin 8.1, platelets 135, sodium 139, potassium 5.7, chloride 104, CO2 17, BUN 132, creatinine 19.03, anion gap 18      Hospital Course:    Please note that the patient left AGAINST MEDICAL ADVICE on 07/30/2017 immediately after hemodialysis session was completed  1)Fluid overload, ESRD on HD, Uremia: Patient with right HD catheter in the chest wall.  Creatinine 19.03 with BUN 132, last HD session was 07/24/17, nephrology consult requested. Metabolic acidosis with elevated anion gap of 18 secondary to uremia .  Please note that the patient left AGAINST MEDICAL ADVICE on 07/30/2017 immediately after hemodialysis session was completed   2)Hyperkalemia: Initial potassium 5.7.  He was given 15 g of Kayexalate in the ED. - Per nephrology-patient left AMA immediately after hemodialysis without ability to recheck electrolytes  3)Hypertensive urgency/emergency: Blood pressure is elevated up to 175/129 on admission. Given labetalol and Nitropaste while in the ED.  Patient notes allergies to hydralazine and clonidine.  Patient admits to noncompliance with medications, he left AMA  4)Anemia of chronic disease: Hemoglobin 8.1 on admission appears to slowly be trending downward over the last few checks.  Unable to repeat H&H as patient left AMA     5)Thrombocytopenia: Chronic.  Platelet count 135 admission, but appears to have been intermittently low previously as far  back as 02/2017 on care everywhere records. - Continue to monitor  Discharge Condition: left AMA, patient is noncompliant  Consults obtained - nephrology  Diet and Activity recommendation:  As advised  Discharge Instructions    Left AMA   Discharge Medications   Left AMA    Major procedures and Radiology Reports - PLEASE review detailed and final reports for all details, in brief -   Dg Chest 2 View  Result Date: 07/23/2017 CLINICAL DATA:  Hypertension. Dialysis patient. Shortness of breath. EXAM: CHEST  2 VIEW COMPARISON:  Most recent comparison 07/13/2017, additional priors. FINDINGS: Right-sided dialysis catheter remains in place with tip at the atrial caval junction. Unchanged cardiomegaly and mediastinal contours. No pulmonary edema, focal airspace disease or pleural effusion. No pneumothorax. No acute osseous abnormalities. IMPRESSION: 1. Stable cardiomegaly. 2. No congestive failure or acute pulmonary process. Electronically Signed   By: Jeb Levering M.D.   On: 07/23/2017 04:14   Dg Chest Portable 1 View  Result Date: 07/13/2017 CLINICAL DATA:  Bilateral breast and lower extremity swelling. EXAM: PORTABLE CHEST 1 VIEW COMPARISON:  06/24/2017 FINDINGS: Dialysis catheter terminates within the expected location of right atrium. Cardiac silhouette is markedly enlarged.  Tortuosity of the aorta. There is no evidence of focal airspace consolidation, pleural effusion or pneumothorax. Osseous structures are without acute abnormality. Soft tissues are grossly normal. IMPRESSION: Markedly enlarged cardiac silhouette. No evidence of pulmonary edema or focal consolidation. Electronically Signed   By: Fidela Salisbury M.D.   On: 07/13/2017 01:27    Micro Results   No results found for this or any previous visit (from the past 240 hour(s)).     Today   Subjective    [name] today has no new complaints          Patient has been seen and examined prior to discharge     Objective   Blood pressure (!) 161/114, pulse 73, temperature 98.1 F (36.7 C), resp. rate 12, height 5\' 5"  (1.651 m), weight 64.7 kg (142 lb 10.2 oz), SpO2 98 %.   Intake/Output Summary (Last 24 hours) at 07/30/2017 1450 Last data filed at 07/30/2017 1303 Gross per 24 hour  Intake -  Output 3500 ml  Net -3500 ml    Exam Gen:- Awake  In no apparent distress  HEENT:- [place].AT,   Neck-Supple Neck,No JVD, Rt subclavian hemodialysis catheter site is clean dry and intact Lungs-diminished in bases with faint rales CV- S1, S2 normal Abd-  +ve B.Sounds, Abd Soft, No tenderness,    Extremity/Skin:- Intact peripheral pulses     Data Review   CBC w Diff:  Lab Results  Component Value Date   WBC 6.3 07/30/2017   HGB  8.1 (L) 07/30/2017   HCT 24.3 (L) 07/30/2017   HCT 21.5 (L) 06/25/2017   PLT 135 (L) 07/30/2017   LYMPHOPCT 19 06/20/2017   MONOPCT 7 06/20/2017   EOSPCT 3 06/20/2017   BASOPCT 0 06/20/2017    CMP:  Lab Results  Component Value Date   NA 139 07/30/2017   K 5.7 (H) 07/30/2017   CL 104 07/30/2017   CO2 17 (L) 07/30/2017   BUN 132 (H) 07/30/2017   CREATININE 19.03 (H) 07/30/2017   PROT 6.4 (L) 07/30/2017   ALBUMIN 3.0 (L) 07/30/2017   BILITOT 1.2 07/30/2017   ALKPHOS 158 (H) 07/30/2017   AST 39 07/30/2017   ALT 49 07/30/2017  .   Total Discharge time is about 33 minutes  Roxan Hockey M.D on 07/30/2017 at 2:50 PM  [facility]   Office  [phone]  Voice Recognition Viviann Spare dictation system was used to create this note, attempts have been made to correct errors. Please contact the author with questions and/or clarifications.

## 2017-07-30 NOTE — H&P (Signed)
History and Physical    Angel [name] HRC:[id number] DOB: Sep 30, 1970 DOA: 07/30/2017  Referring MD/NP/PA:Dr. Ward  PCP: Gwenlyn Saran, MD  Patient coming from: Home  Chief Complaint: Need of hemodialysis  I have personally briefly reviewed patient's old medical records in [place]   HPI: Angel [name] is a 46 y.o. 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  Who reports moving from [place] to [place] and is having difficulty in getting outpatient dialysis.  He has been coming here on a weekly basis to receive dialysis.  He reports having swelling secondary to clonidine and hydralazine.  Denies having any significant fever, chills, shortness of breath, or chest pain.  Admits to complaints of diarrhea having anywhere from 8-9 bowel movements per day for which he took Pepto-Bismol and noted complaints of some black stool.  He reports following his surgery to repair his AAA in 02/2017 that he cannot lay down due to discomfort.  ED Course: Upon admission into the emergency department patient was noted to be afebrile, pulse 71-80, respirations 14-18, blood pressure 165/128-175/129, and O2 saturations 100% on room air.  Labs revealed WBC 6.3, hemoglobin 8.1, platelets 135, sodium 139, potassium 5.7, chloride 104, CO2 17, BUN 132, creatinine 19.03, anion gap 18 Review of Systems  Constitutional: Positive for malaise/fatigue. Negative for weight loss.  HENT: Negative for ear discharge and ear pain.   Eyes: Negative for pain and discharge.  Respiratory: Negative for shortness of breath and wheezing.   Cardiovascular: Positive for leg swelling. Negative for chest pain.  Gastrointestinal: Positive for diarrhea. Negative for nausea and vomiting.  Genitourinary: Negative for dysuria and frequency.  Musculoskeletal: Negative for back pain.  Skin: Negative for  itching and rash.  Neurological: Negative for speech change and focal weakness.       Positive for confusion  Psychiatric/Behavioral: Negative for hallucinations and substance abuse.    Past Medical History:  Diagnosis Date  . Chronic anemia   . Chronic combined systolic and diastolic CHF (congestive heart failure) ([place])   . ESRD on dialysis ([place])   . FSGS (focal segmental glomerulosclerosis)   . GERD (gastroesophageal reflux disease)   . HLD (hyperlipidemia)   . Hypertension   . NSTEMI (non-ST elevated myocardial infarction) ([place]) 07/2017   pt refused cath  . Renal disorder   . Seizure ([place])   . Stroke (cerebrum) [facility])     Past Surgical History:  Procedure Laterality Date  . ABDOMINAL AORTIC ANEURYSM REPAIR    . KIDNEY SURGERY       reports that  has never smoked. he has never used smokeless tobacco. He reports that he does not drink alcohol or use drugs.  Allergies  Allergen Reactions  . Clonidine Derivatives Swelling    Leg swelling  . Hydralazine Hcl Swelling    Leg swelling     Family History  Problem Relation Age of Onset  . Hypertension Mother   . Kidney disease Mother   . Asthma Father   . Hypertension Father   . Kidney disease Father   . Hypertension Sister   . Heart disease Sister   . Kidney disease Sister   . Heart disease Brother   . Hypertension Brother   . COPD Brother     Prior to Admission medications   Medication Sig Start Date End Date Taking? Authorizing Provider  aspirin EC  81 MG tablet Take 81 mg daily by mouth.    [provider]  atorvastatin (LIPITOR) 40 MG tablet Take 40 mg at bedtime by mouth.    [provider]  carvedilol (COREG) 25 MG tablet Take 25 mg 2 (two) times daily with a meal by mouth.    [provider]  cholecalciferol (VITAMIN D) 1000 units tablet Take 1,000 Units by mouth 2 (two) times daily.    [provider]  furosemide (LASIX) 80 MG tablet Take 80 mg at bedtime by mouth.     [provider]  Lactobacillus Rhamnosus, GG, (CULTURELLE) CAPS Take 1 capsule by mouth daily.    [provider]  lisinopril (PRINIVIL,ZESTRIL) 40 MG tablet Take 40 mg daily by mouth.    [provider]  multivitamin (RENA-VIT) TABS tablet Take 1 tablet by mouth 2 (two) times daily.    [provider]  sevelamer carbonate (RENVELA) 800 MG tablet Take 1,600 mg by mouth See admin instructions. Take 2 tablets (1600 mg) three times daily with meals and with snacks    [provider]    Physical Exam:  Constitutional: Chronically ill-appearing male appears to be in discomfort sitting in chair Vitals:   07/30/17 0158 07/30/17 0315 07/30/17 0330 07/30/17 0345  BP: (!) 175/129 (!) 165/128 (!) 165/138 (!) 165/128  Pulse: 80 79 80 71  Resp: 18 14 16 14   Temp: (!) 97.5 F (36.4 C)     TempSrc: Oral     SpO2: 100% 100% 100% 100%  Weight: 51.7 kg (114 lb)     Height: 5\' 5"  (1.651 m)      Eyes: PERRL, lids and conjunctivae normal ENMT: Mucous membranes are moist. Posterior pharynx clear of any exudate or lesions.  Neck: normal, supple, no masses, no thyromegaly Respiratory: Decreased overall aeration without significant air movement Cardiovascular: Regular rate and rhythm, no murmurs / rubs / gallops.  +2 pitting lower extremity edema. 2+ pedal pulses. No carotid bruits.  Hemodialysis catheter of the upper right chest wall. Abdomen: no tenderness, no masses palpated. No hepatosplenomegaly. Bowel sounds positive.  Musculoskeletal: no clubbing / cyanosis. No joint deformity upper and lower extremities. Good ROM, no contractures. Normal muscle tone.  Skin: no rashes, lesions, ulcers. No induration Neurologic: CN 2-12 grossly intact. Sensation intact, DTR normal. Strength 5/5 in all 4.  Psychiatric: Normal judgment and insight.  Mild confusion noted, but otherwise oriented x3.   Labs on Admission: I have personally reviewed following labs and imaging  studies  CBC: Recent Labs  Lab 07/24/17 0734 07/30/17 0216  WBC 7.6 6.3  HGB 8.4* 8.1*  HCT 24.8* 24.3*  MCV 81.3 82.4  PLT 187 638*   Basic Metabolic Panel: Recent Labs  Lab 07/23/17 1152 07/24/17 0733 07/30/17 0216  NA 139 138 139  K 5.1 4.6 5.7*  CL 102 100* 104  CO2 17* 20* 17*  GLUCOSE 117* 102* 145*  BUN 126* 140* 132*  CREATININE 18.28* 19.87* 19.03*  CALCIUM 9.0 8.6* 9.1  PHOS  --  8.4*  --    GFR: Estimated Creatinine Clearance: 3.5 mL/min (A) (by C-G formula based on SCr of 19.03 mg/dL (H)). Liver Function Tests: Recent Labs  Lab 07/24/17 0733 07/30/17 0216  AST  --  39  ALT  --  49  ALKPHOS  --  158*  BILITOT  --  1.2  PROT  --  6.4*  ALBUMIN 3.1* 3.0*   No results for input(s): LIPASE, AMYLASE in the  last 168 hours. No results for input(s): AMMONIA in the last 168 hours. Coagulation Profile: No results for input(s): INR, PROTIME in the last 168 hours. Cardiac Enzymes: No results for input(s): CKTOTAL, CKMB, CKMBINDEX, TROPONINI in the last 168 hours. BNP (last 3 results) No results for input(s): PROBNP in the last 8760 hours. HbA1C: No results for input(s): HGBA1C in the last 72 hours. CBG: Recent Labs  Lab 07/23/17 0759 07/24/17 1243  GLUCAP 110* 119*   Lipid Profile: No results for input(s): CHOL, HDL, LDLCALC, TRIG, CHOLHDL, LDLDIRECT in the last 72 hours. Thyroid Function Tests: No results for input(s): TSH, T4TOTAL, FREET4, T3FREE, THYROIDAB in the last 72 hours. Anemia Panel: No results for input(s): VITAMINB12, FOLATE, FERRITIN, TIBC, IRON, RETICCTPCT in the last 72 hours. Urine analysis: No results found for: COLORURINE, APPEARANCEUR, LABSPEC, PHURINE, GLUCOSEU, HGBUR, BILIRUBINUR, KETONESUR, PROTEINUR, UROBILINOGEN, NITRITE, LEUKOCYTESUR Sepsis Labs: No results found for this or any previous visit (from the past 240 hour(s)).   Radiological Exams on Admission: No results found.  EKG: Independently reviewed.  Sinus rhythm  with left ventricular hypertrophy and borderline QTC prolongation of 487.  Assessment/Plan Fluid overload, ESRD on HD, Uremia: Patient with right HD catheter in the chest wall.  Creatinine 19.03 with BUN 132. - Admit to a stepdown bed - Renal diet with fluid restriction - Nephrology Dr. Justin Mend consulted by Dr. Leonides Schanz, they will dialyze patient in a.m.  Hyperkalemia: Initial potassium 5.7.  He was given 15 g of Kayexalate in the ED. - Per nephrology  Hypertensive urgency/emergency: Blood pressure is elevated up to 175/129 on admission. Given labetalol and Nitropaste while in the ED.  Patient notes allergies to hydralazine and clonidine. - Continue home meds - Hydralazine IV prn >sBP  Anemia of chronic disease: Hemoglobin 8.1 on admission appears to slowly be trending downward over the last few checks. - Type and screen for possible need of blood products - Continue to monitor     Metabolic acidosis with elevated anion gap of 18 secondary to uremia   Hyperlipidemia - Continue atorvastatin  Thrombocytopenia: Chronic.  Platelet count 135 admission, but appears to have been intermittently low previously as far back as 02/2017 on care everywhere records. - Continue to monitor  DVT prophylaxis: heparin  Code Status: Full Family Communication: No family present at bedside Disposition Plan: Discharge home in 1-2 days Consults called: Justin Mend  Admission status: observation  Norval Morton MD [facility] Pager [phone]   If 7PM-7AM, please contact night-coverage [email or web address] Password [facility]  07/30/2017, 4:39 AM

## 2017-07-30 NOTE — Progress Notes (Signed)
Patient arrived to unit by [facility].  Reviewed treatment plan and this RN agrees with plan.  Report received from ED RN, Hildred Alamin.  Consent obtained.  Patient A & O X 4.   Lung sounds diminished to ausculation in all fields. BLE 1+ pitting edema. Cardiac:  NSR.  Removed caps and cleansed RIJ catheter with chlorhedxidine.  Aspirated ports of heparin and flushed them with saline per protocol.  Connected and secured lines, initiated treatment at [place].  UF Goal of 4000 mL and net fluid removal 3.5 L.  Will continue to monitor.

## 2017-07-30 NOTE — Discharge Instructions (Signed)
D/C home per self post HD.  No further D/C instructions.

## 2017-07-30 NOTE — ED Triage Notes (Signed)
The pt is here for dialysis he was last dialyzed a week ago no distress no sob catheter in his chest

## 2017-08-04 ENCOUNTER — Emergency Department (HOSPITAL_COMMUNITY)
Admission: EM | Admit: 2017-08-04 | Discharge: 2017-08-04 | Disposition: A | Discharge status: Home / Self Care | Payer: Medicare Other | Attending: Physician Assistant | Admitting: Physician Assistant

## 2017-08-04 ENCOUNTER — Encounter (HOSPITAL_COMMUNITY): Payer: Self-pay

## 2017-08-04 DIAGNOSIS — Z992 Dependence on renal dialysis: Secondary | ICD-10-CM | POA: Insufficient documentation

## 2017-08-04 DIAGNOSIS — I5042 Chronic combined systolic (congestive) and diastolic (congestive) heart failure: Secondary | ICD-10-CM | POA: Diagnosis not present

## 2017-08-04 DIAGNOSIS — Z7982 Long term (current) use of aspirin: Secondary | ICD-10-CM | POA: Diagnosis not present

## 2017-08-04 DIAGNOSIS — I132 Hypertensive heart and chronic kidney disease with heart failure and with stage 5 chronic kidney disease, or end stage renal disease: Secondary | ICD-10-CM | POA: Diagnosis not present

## 2017-08-04 DIAGNOSIS — Z79899 Other long term (current) drug therapy: Secondary | ICD-10-CM | POA: Diagnosis not present

## 2017-08-04 DIAGNOSIS — N186 End stage renal disease: Secondary | ICD-10-CM

## 2017-08-04 DIAGNOSIS — R140 Abdominal distension (gaseous): Secondary | ICD-10-CM | POA: Diagnosis present

## 2017-08-04 LAB — COMPREHENSIVE METABOLIC PANEL
ALK PHOS: 132 U/L — AB (ref 38–126)
ALT: 56 U/L (ref 17–63)
ANION GAP: 19 — AB (ref 5–15)
AST: 38 U/L (ref 15–41)
Albumin: 3.3 g/dL — ABNORMAL LOW (ref 3.5–5.0)
BILIRUBIN TOTAL: 0.9 mg/dL (ref 0.3–1.2)
BUN: 113 mg/dL — ABNORMAL HIGH (ref 6–20)
CALCIUM: 9.9 mg/dL (ref 8.9–10.3)
CO2: 20 mmol/L — ABNORMAL LOW (ref 22–32)
Chloride: 102 mmol/L (ref 101–111)
Creatinine, Ser: 18.15 mg/dL — ABNORMAL HIGH (ref 0.61–1.24)
GFR, EST AFRICAN AMERICAN: 3 mL/min — AB (ref >60)
GFR, EST NON AFRICAN AMERICAN: 3 mL/min — AB (ref >60)
Glucose, Bld: 119 mg/dL — ABNORMAL HIGH (ref 65–99)
Potassium: 4.8 mmol/L (ref 3.5–5.1)
SODIUM: 141 mmol/L (ref 135–145)
TOTAL PROTEIN: 7.1 g/dL (ref 6.5–8.1)

## 2017-08-04 LAB — CBC
HCT: 28.5 % — ABNORMAL LOW (ref 39.0–52.0)
HEMOGLOBIN: 9.1 g/dL — AB (ref 13.0–17.0)
MCH: 26.3 pg (ref 26.0–34.0)
MCHC: 31.9 g/dL (ref 30.0–36.0)
MCV: 82.4 fL (ref 78.0–100.0)
PLATELETS: 235 10*3/uL (ref 150–400)
RBC: 3.46 MIL/uL — AB (ref 4.22–5.81)
RDW: 15.5 % (ref 11.5–15.5)
WBC: 7.6 10*3/uL (ref 4.0–10.5)

## 2017-08-04 NOTE — ED Notes (Signed)
Pt ambulated in hallway with pulse ox. O2 stayed within normal range of saturation, 98-87. Upon sitting back in bed saturation was 100.

## 2017-08-04 NOTE — ED Provider Notes (Signed)
[place] EMERGENCY DEPARTMENT Provider Note   CSN: [id number] Arrival date & time: 08/04/17  5188     History   Chief Complaint Chief Complaint  Patient presents with  . Vascular Access Problem    HPI Angel [name] is a 46 y.o. male.  HPI   Patient is a 46 year old male with FSGS end-stage renal disease on dialysis.  He is recently moved to the area and had trouble establishing care.  However he has established care at [facility] dialysis clinic for Monday.  However he reports that his belly feels a little bit bloated and  want to get him checked out to see if he could have a paracentesis.  Patient has no pain in the abdomen.  No fever.  No other complaints.  Patient's last dialysis was on December 24 when he came into the ED asking for dialysis.    Past Medical History:  Diagnosis Date  . Chronic anemia   . Chronic combined systolic and diastolic CHF (congestive heart failure) ([place])   . ESRD on dialysis ([place])   . FSGS (focal segmental glomerulosclerosis)   . GERD (gastroesophageal reflux disease)   . HLD (hyperlipidemia)   . Hypertension   . NSTEMI (non-ST elevated myocardial infarction) ([place]) 07/2017   pt refused cath  . Renal disorder   . Seizure ([place])   . Stroke (cerebrum) [facility])     Patient Active Problem List   Diagnosis Date Noted  . Thrombocytopenia ([place]) 07/30/2017  . Metabolic acidosis, increased anion gap 07/23/2017  . Uremia 07/23/2017  . Dialysis patient, noncompliant ([place])   . End-stage renal disease needing dialysis ([place]) 07/13/2017  . Hypertension 07/13/2017  . Non-ST elevation (NSTEMI) myocardial infarction ([place])   . Elevated troponin   . Hypertensive emergency 06/24/2017  . Hypertensive urgency 06/20/2017  . Fluid overload 06/20/2017  . Hyperkalemia 06/20/2017  . GERD (gastroesophageal reflux disease) 06/20/2017  . Seizure ([place]) 06/20/2017  . Abnormal LFTs 06/20/2017  . Anemia in ESRD (end-stage renal  disease) ([place]) 06/20/2017  . HLD (hyperlipidemia)   . Stroke (cerebrum) ([place])   . ESRD on dialysis ([place])   . Acute on chronic diastolic CHF (congestive heart failure) ([place])     Past Surgical History:  Procedure Laterality Date  . ABDOMINAL AORTIC ANEURYSM REPAIR    . KIDNEY SURGERY         Home Medications    Prior to Admission medications   Medication Sig Start Date End Date Taking? Authorizing Provider  aspirin EC 81 MG tablet Take 81 mg daily by mouth.    [provider]  atorvastatin (LIPITOR) 40 MG tablet Take 40 mg at bedtime by mouth.    [provider]  carvedilol (COREG) 25 MG tablet Take 25 mg 2 (two) times daily with a meal by mouth.    [provider]  cholecalciferol (VITAMIN D) 1000 units tablet Take 1,000 Units by mouth 2 (two) times daily.    [provider]  furosemide (LASIX) 80 MG tablet Take 80 mg at bedtime by mouth.    [provider]  Lactobacillus Rhamnosus, GG, (CULTURELLE) CAPS Take 1 capsule by mouth daily.    [provider]  lisinopril (PRINIVIL,ZESTRIL) 40 MG tablet Take 40 mg daily by mouth.    [provider]  multivitamin (RENA-VIT) TABS tablet Take 1 tablet by mouth 2 (two) times daily.    [provider]  sevelamer carbonate (RENVELA) 800 MG tablet Take 1,600 mg by mouth  See admin instructions. Take 2 tablets (1600 mg) three times daily with meals and with snacks    [provider]    Family History Family History  Problem Relation Age of Onset  . Hypertension Mother   . Kidney disease Mother   . Asthma Father   . Hypertension Father   . Kidney disease Father   . Hypertension Sister   . Heart disease Sister   . Kidney disease Sister   . Heart disease Brother   . Hypertension Brother   . COPD Brother     Social History Social History   Tobacco Use  . Smoking status: Never Smoker  . Smokeless tobacco: Never Used  Substance Use Topics  . Alcohol use:  No    Frequency: Never  . Drug use: No     Allergies   Clonidine derivatives and Hydralazine hcl   Review of Systems Review of Systems  Constitutional: Negative for activity change.  Respiratory: Negative for shortness of breath.   Cardiovascular: Negative for chest pain.  Gastrointestinal: Negative for abdominal pain, nausea and vomiting.     Physical Exam Updated Vital Signs BP (!) 198/151 (BP Location: Right Arm)   Pulse 94   Temp 98.9 F (37.2 C) (Oral)   Resp 20   Ht 5\' 5"  (1.651 m)   Wt 68 kg (150 lb)   SpO2 94%   BMI 24.96 kg/m   Physical Exam  Constitutional: He is oriented to person, place, and time. He appears well-nourished.  HENT:  Head: Normocephalic.  Eyes: Conjunctivae are normal.  Cardiovascular: Normal rate and regular rhythm.  Pulmonary/Chest: Effort normal and breath sounds normal. No respiratory distress.  No crackles.  Abdominal: Soft. He exhibits no distension. There is no tenderness.  Old scar.  Musculoskeletal: He exhibits edema.  Fistula left arm.  Neurological: He is oriented to person, place, and time.  Skin: Skin is warm and dry. He is not diaphoretic.  Psychiatric: He has a normal mood and affect. His behavior is normal.     ED Treatments / Results  Labs (all labs ordered are listed, but only abnormal results are displayed) Labs Reviewed  COMPREHENSIVE METABOLIC PANEL - Abnormal; Notable for the following components:      Result Value   CO2 20 (*)    Glucose, Bld 119 (*)    BUN 113 (*)    Creatinine, Ser 18.15 (*)    Albumin 3.3 (*)    Alkaline Phosphatase 132 (*)    GFR calc non Af Amer 3 (*)    GFR calc Af Amer 3 (*)    Anion gap 19 (*)    All other components within normal limits  CBC - Abnormal; Notable for the following components:   RBC 3.46 (*)    Hemoglobin 9.1 (*)    HCT 28.5 (*)    All other components within normal limits    EKG  EKG Interpretation None       Radiology No results  found.  Procedures Procedures (including critical care time)  Medications Ordered in ED Medications - No data to display   Initial Impression / Assessment and Plan / ED Course  I have reviewed the triage vital signs and the nursing notes.  Pertinent labs & imaging results that were available during my care of the patient were reviewed by me and considered in my medical decision making (see chart for details).    Patient is a 46 year old male with FSGS end-stage renal disease on  dialysis.  He is recently moved to the area and had trouble establishing care.  However he has established care at [facility] dialysis clinic for Monday.  However he reports that his belly feels a little bit bloated and  want to get him checked out to see if he could have a paracentesis.  Patient has no pain in the abdomen.  No fever.  No other complaints.  Patient's last dialysis was on December 24 when he came into the ED asking for dialysis.  1:17 PM  Neither labs nor vital signs show any need to do emergent dialysis.  We will plan to have him to have dialysis on Monday.  As for his abdomen, there is no evidence for thought of being SBP.  Patient wanted to get some fluid removed for comfort.  However patient's belly is not distended, I do not see evidence of shortness of breath as a result.  Ascites is not even noted on exam.  Will have him follow-up with his primary, and dialysis.  Final Clinical Impressions(s) / ED Diagnoses   Final diagnoses:  None    ED Discharge Orders    None       Macarthur Critchley, MD 08/04/17 1317

## 2017-08-04 NOTE — Discharge Instructions (Signed)
Please come back with any symptoms.  Any shortness of breath.  Or abdominal pain.

## 2017-08-04 NOTE — ED Triage Notes (Signed)
Pt presents for dialysis. States due M/W/F, last treatment on christmas. States scheduled for Monday but has not received treatment x 2 scheduled days. Pt reports minor cough and "pressure on belly from fluid."

## 2017-08-14 DIAGNOSIS — I5042 Chronic combined systolic (congestive) and diastolic (congestive) heart failure: Secondary | ICD-10-CM | POA: Insufficient documentation

## 2017-08-14 NOTE — Discharge Summary (Addendum)
   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 He had been coming to the ED to receive dialysis via a catheter inthe right chest after leaving AMA in November 2018 despite being educated that he needed to stay to be seen by Vascular Surgery as part of the process to initiate outpt HD.He was again admitted earlier this month, at which time a repeat Vasc Surgery consultation was ongoing when he chose to leave AMA again (07/16/18).    Hospital Course: Listed below are the active problems present, and the status of the care of these problems, at the time the pt decided to LEAVE AMA:  ESRD; acidosis; uremia; volume-overload - noncompliant w/ HD  Hypertension with hypertensive urgency A consequence of noncompliance w/ volume control per HD - needs HD   Anemiaof ESRD Hgb is stable at 9.1 on admission     Medication List    Unable to be finalized as pt LEFT AMA  Day of Discharge Wt Readings from Last 3 Encounters:  08/04/17 68 kg (150 lb)  07/30/17 64.7 kg (142 lb 10.2 oz)  07/24/17 64.5 kg (142 lb 3.2 oz)   Temp Readings from Last 3 Encounters:  08/04/17 97.8 F (36.6 C) (Oral)  07/30/17 98.1 F (36.7 C)  07/24/17 98.6 F (37 C)   BP Readings from Last 3 Encounters:  08/04/17 (!) 187/155  07/30/17 (!) 161/114   07/24/17 (!) 178/124   Pulse Readings from Last 3 Encounters:  08/04/17 94  07/30/17 73  07/24/17 80    Physical Exam: Exam not able to be completed at time of d/c as pt LEFT AMA  7:12 PM 08/14/17  [name], MD [facility] Office  [phone] Pager [phone]  On-Call/Text Page:      [name]      password [facility]

## 2017-08-14 NOTE — Progress Notes (Deleted)
Cardiology Office Note    Date:  08/14/2017  ID:  Angel [name], Angel [name] 1971-04-18, MRN [id number] PCP:  Gwenlyn Saran, MD  Cardiologist: Dr. Lavone Neri - [facility], [place]?***   Chief Complaint: ***  History of Present Illness:  Angel [name] is a 47 y.o. male with history of ESRD on HD, chronic anemia, HTN, CVA, seizure, AAA s/p repair, recently diagnosed NSTEMI/acute combined CHF (prior LVEF not known, recently 25-30%), small pericardial effusion who presents for post-hospital follow-up. He was recently admitted 06/2017 with hypertensive urgency and dyspnea in the setting of missing HD. He ruled in for NSTEMI with troponin of 4.3. He was also found to have anemia Hgb 6.7 requiring transfusion, iron, and aranesp. 2D echo 06/26/17 showed EF 25-30%, moderate LVH, severe diffuse HK, grade 2 DD, moderately dilated LA, moderate TR, small perciardial effusion. He was treated with heparin gtt, statin, aspirin, BB, lisinopril. Cardiac cath recommended but patient refused and wanted to leave against medical advice. Apparently he was going to be homeless at time of leaving. He has had difficulty going to outpatient dialysis. He has been coming to the ED to receive dialysis via catheter in right chest. He was readmitted 07/23/17 with recurrent hypertensive urgency and uremia, and again 07/30/17 during which he left AMA right after dialysis. He also had reported black stools at which time Hgb was 8-9, similar to recent values.    Chronic combined CHF  Recent NSTEMI  HTN  ESRD on HD     Past Medical History:  Diagnosis Date  . Chronic anemia   . Chronic combined systolic and diastolic CHF (congestive heart failure) ([place])   . ESRD on dialysis ([place])   . FSGS (focal segmental glomerulosclerosis)   . GERD (gastroesophageal reflux disease)   . HLD (hyperlipidemia)   . Hypertension   . NSTEMI (non-ST elevated myocardial infarction) ([place]) 07/2017   pt refused cath  . Renal disorder   . Seizure  ([place])   . Stroke (cerebrum) [facility])     Past Surgical History:  Procedure Laterality Date  . ABDOMINAL AORTIC ANEURYSM REPAIR    . KIDNEY SURGERY      Current Medications: No outpatient medications have been marked as taking for the 08/15/17 encounter (Appointment) with Charlie Pitter, PA-C.   ***   Allergies:   Clonidine derivatives and Hydralazine hcl   Social History   Socioeconomic History  . Marital status: Single    Spouse name: Not on file  . Number of children: Not on file  . Years of education: Not on file  . Highest education level: Not on file  Social Needs  . Financial resource strain: Not on file  . Food insecurity - worry: Not on file  . Food insecurity - inability: Not on file  . Transportation needs - medical: Not on file  . Transportation needs - non-medical: Not on file  Occupational History  . Not on file  Tobacco Use  . Smoking status: Never Smoker  . Smokeless tobacco: Never Used  Substance and Sexual Activity  . Alcohol use: No    Frequency: Never  . Drug use: No  . Sexual activity: Not on file  Other Topics Concern  . Not on file  Social History Narrative  . Not on file     Family History:  Family History  Problem Relation Age of Onset  . Hypertension Mother   . Kidney disease Mother   . Asthma Father   . Hypertension Father   .  Kidney disease Father   . Hypertension Sister   . Heart disease Sister   . Kidney disease Sister   . Heart disease Brother   . Hypertension Brother   . COPD Brother    ***  ROS:   Please see the history of present illness. Otherwise, review of systems is positive for ***.  All other systems are reviewed and otherwise negative.    PHYSICAL EXAM:   VS:  There were no vitals taken for this visit.  BMI: There is no height or weight on file to calculate BMI. GEN: Well nourished, well developed, in no acute distress  HEENT: normocephalic, atraumatic Neck: no JVD, carotid bruits, or masses Cardiac: ***RRR; no  murmurs, rubs, or gallops, no edema  Respiratory:  clear to auscultation bilaterally, normal work of breathing GI: soft, nontender, nondistended, + BS MS: no deformity or atrophy  Skin: warm and dry, no rash Neuro:  Alert and Oriented x 3, Strength and sensation are intact, follows commands Psych: euthymic mood, full affect  Wt Readings from Last 3 Encounters:  08/04/17 150 lb (68 kg)  07/30/17 142 lb 10.2 oz (64.7 kg)  07/24/17 142 lb 3.2 oz (64.5 kg)      Studies/Labs Reviewed:   EKG:  EKG was ordered today and personally reviewed by me and demonstrates *** EKG was not ordered today.***  Recent Labs: 06/25/2017: TSH 2.543 08/04/2017: ALT 56; BUN 113; Creatinine, Ser 18.15; Hemoglobin 9.1; Platelets 235; Potassium 4.8; Sodium 141   Lipid Panel No results found for: CHOL, TRIG, HDL, CHOLHDL, VLDL, LDLCALC, LDLDIRECT  Additional studies/ records that were reviewed today include: Summarized above.***    ASSESSMENT & PLAN:   1. ***  Disposition: F/u with ***   Medication Adjustments/Labs and Tests Ordered: Current medicines are reviewed at length with the patient today.  Concerns regarding medicines are outlined above. Medication changes, Labs and Tests ordered today are summarized above and listed in the Patient Instructions accessible in Encounters.   Signed, Charlie Pitter, PA-C  08/14/2017 12:33 PM    [facility] [address], [place], [place]  [address] Phone: [phone]; Fax: [phone]

## 2017-08-15 ENCOUNTER — Ambulatory Visit: Payer: Medicare Other | Admitting: Physician Assistant

## 2017-08-16 ENCOUNTER — Ambulatory Visit: Payer: Medicare Other | Admitting: Cardiology

## 2017-08-21 ENCOUNTER — Encounter: Payer: Self-pay | Admitting: Cardiovascular Disease

## 2017-08-22 ENCOUNTER — Ambulatory Visit: Payer: Medicare Other | Admitting: Cardiovascular Disease

## 2017-08-22 NOTE — Progress Notes (Deleted)
No chief complaint on file.  History of Present Illness: 47 yo male with history of ESRD on HD, HTN, chronic diastolic CHF, prior CVA and seizure, AAA repair who is here today for cardiac follow up. I saw him as a consult at [facility] 06/25/17. He was admitted with a hypertensive emergency with chest pain/dyspnea after he missed dialysis for several days. Troponin was mildly elevated. He also anemic. No recurrent chest pain when his BP was controlled. Echo 06/26/17 with severe LV systolic dysfunction, OJJK=09-38%. Moderate TR. Small pericardial effusion. He elected to leave the hospital prior to a cardiac cath as he had to sign a lease for his apartment.   Primary Care Physician: Gwenlyn Saran, MD   Past Medical History:  Diagnosis Date  . Chronic anemia   . Chronic combined systolic and diastolic CHF (congestive heart failure) ([place])   . ESRD on dialysis ([place])   . FSGS (focal segmental glomerulosclerosis)   . GERD (gastroesophageal reflux disease)   . HLD (hyperlipidemia)   . Hypertension   . NSTEMI (non-ST elevated myocardial infarction) ([place]) 07/2017   pt refused cath  . Renal disorder   . Seizure ([place])   . Stroke (cerebrum) [facility])     Past Surgical History:  Procedure Laterality Date  . ABDOMINAL AORTIC ANEURYSM REPAIR    . KIDNEY SURGERY      Current Outpatient Medications  Medication Sig Dispense Refill  . aspirin EC 81 MG tablet Take 81 mg daily by mouth.    Marland Kitchen atorvastatin (LIPITOR) 40 MG tablet Take 40 mg at bedtime by mouth.    . carvedilol (COREG) 25 MG tablet Take 25 mg 2 (two) times daily with a meal by mouth.    . cholecalciferol (VITAMIN D) 1000 units tablet Take 1,000 Units by mouth 2 (two) times daily.    . furosemide (LASIX) 80 MG tablet Take 80 mg at bedtime by mouth.    . Lactobacillus Rhamnosus, GG, (CULTURELLE) CAPS Take 1 capsule by mouth daily.    Marland Kitchen lisinopril (PRINIVIL,ZESTRIL) 40 MG tablet Take 40 mg daily by mouth.    . multivitamin (RENA-VIT) TABS  tablet Take 1 tablet by mouth 2 (two) times daily.    . sevelamer carbonate (RENVELA) 800 MG tablet Take 1,600 mg by mouth See admin instructions. Take 2 tablets (1600 mg) three times daily with meals and with snacks     No current facility-administered medications for this visit.     Allergies  Allergen Reactions  . Clonidine Derivatives Swelling    Leg swelling  . Hydralazine Hcl Swelling    Leg swelling     Social History   Socioeconomic History  . Marital status: Single    Spouse name: Not on file  . Number of children: Not on file  . Years of education: Not on file  . Highest education level: Not on file  Social Needs  . Financial resource strain: Not on file  . Food insecurity - worry: Not on file  . Food insecurity - inability: Not on file  . Transportation needs - medical: Not on file  . Transportation needs - non-medical: Not on file  Occupational History  . Not on file  Tobacco Use  . Smoking status: Never Smoker  . Smokeless tobacco: Never Used  Substance and Sexual Activity  . Alcohol use: No    Frequency: Never  . Drug use: No  . Sexual activity: Not on file  Other Topics Concern  . Not on file  Social History Narrative  . Not on file    Family History  Problem Relation Age of Onset  . Hypertension Mother   . Kidney disease Mother   . Asthma Father   . Hypertension Father   . Kidney disease Father   . Hypertension Sister   . Heart disease Sister   . Kidney disease Sister   . Heart disease Brother   . Hypertension Brother   . COPD Brother     Review of Systems:  As stated in the HPI and otherwise negative.   There were no vitals taken for this visit.  Physical Examination: General: Well developed, well nourished, NAD  HEENT: OP clear, mucus membranes moist  SKIN: warm, dry. No rashes. Neuro: No focal deficits  Musculoskeletal: Muscle strength 5/5 all ext  Psychiatric: Mood and affect normal  Neck: No JVD, no carotid bruits, no  thyromegaly, no lymphadenopathy.  Lungs:Clear bilaterally, no wheezes, rhonci, crackles Cardiovascular: Regular rate and rhythm. No murmurs, gallops or rubs. Abdomen:Soft. Bowel sounds present. Non-tender.  Extremities: No lower extremity edema. Pulses are 2 + in the bilateral DP/PT.  EKG:  EKG {ACTION; IS/IS PXT:06269485} ordered today. The ekg ordered today demonstrates ***  Recent Labs: 06/25/2017: TSH 2.543 08/04/2017: ALT 56; BUN 113; Creatinine, Ser 18.15; Hemoglobin 9.1; Platelets 235; Potassium 4.8; Sodium 141   Lipid Panel No results found for: CHOL, TRIG, HDL, CHOLHDL, VLDL, LDLCALC, LDLDIRECT   Wt Readings from Last 3 Encounters:  08/04/17 150 lb (68 kg)  07/30/17 142 lb 10.2 oz (64.7 kg)  07/24/17 142 lb 3.2 oz (64.5 kg)     Other studies Reviewed: Additional studies/ records that were reviewed today include: ***. Review of the above records demonstrates: ***   Assessment and Plan:   1. Dilated cardiomyopathy:   2. Chronic combined systolic and diastolic CHF:   Current medicines are reviewed at length with the patient today.  The patient {ACTIONS; HAS/DOES NOT HAVE:19233} concerns regarding medicines.  The following changes have been made:  {PLAN; NO CHANGE:13088:s}  Labs/ tests ordered today include: *** No orders of the defined types were placed in this encounter.    Disposition:   FU with *** in {gen number 4-62:[id number]} {TIME; UNITS DAY/WEEK/MONTH:19136}   Signed, Lauree Chandler, MD 08/22/2017 11:51 AM    [facility] [place], [place], [place]  [address] Phone: [phone]; Fax: [phone]

## 2017-08-24 ENCOUNTER — Encounter: Payer: Self-pay | Admitting: Cardiovascular Disease

## 2017-08-28 ENCOUNTER — Encounter: Payer: Medicare Other | Admitting: Vascular Surgery

## 2017-09-14 ENCOUNTER — Encounter: Payer: Self-pay | Admitting: Nephrology

## 2017-09-19 ENCOUNTER — Ambulatory Visit (INDEPENDENT_AMBULATORY_CARE_PROVIDER_SITE_OTHER): Payer: Medicare Other | Admitting: Vascular Surgery

## 2017-09-19 ENCOUNTER — Other Ambulatory Visit: Payer: Self-pay | Admitting: *Deleted

## 2017-09-19 ENCOUNTER — Encounter: Payer: Self-pay | Admitting: *Deleted

## 2017-09-19 ENCOUNTER — Encounter: Payer: Self-pay | Admitting: Vascular Surgery

## 2017-09-19 ENCOUNTER — Other Ambulatory Visit: Payer: Self-pay

## 2017-09-19 VITALS — BP 165/106 | HR 96 | Temp 97.0°F | Resp 16 | Ht 65.0 in | Wt 134.0 lb

## 2017-09-19 DIAGNOSIS — N184 Chronic kidney disease, stage 4 (severe): Secondary | ICD-10-CM | POA: Diagnosis not present

## 2017-09-19 NOTE — Progress Notes (Signed)
Patient name: Angel [name] MRN: [id number] DOB: 09-23-1970 Sex: male   REASON FOR CONSULT:    To evaluate for hemodialysis access.  The consult is requested by Dr. Sharol Roussel.  HPI:   Angel [name] is a pleasant 47 y.o. male, with end-stage renal disease.  He dialyzes on Monday Wednesdays and Fridays.  He had a long history of chronic kidney disease.  He underwent open abdominal aortic aneurysm repair last year.  His renal function is worsened and he is being dialyzed with a right IJ tunneled dialysis catheter.  We have been asked to provide long-term hemodialysis access.  He is left-handed.  He denies any recent uremic symptoms.  Specifically, he denies nausea, vomiting, fatigue, anorexia, or palpitations.  Past Medical History:  Diagnosis Date  . AAA (abdominal aortic aneurysm) ([place])   . Chronic anemia   . Chronic combined systolic and diastolic CHF (congestive heart failure) ([place])   . ESRD on dialysis ([place])   . FSGS (focal segmental glomerulosclerosis)   . GERD (gastroesophageal reflux disease)   . HLD (hyperlipidemia)   . Hypertension   . NSTEMI (non-ST elevated myocardial infarction) ([place]) 07/2017   pt refused cath  . Renal disorder   . Seizure ([place])   . Stroke (cerebrum) [facility])     Family History  Problem Relation Age of Onset  . Hypertension Mother   . Kidney disease Mother   . Asthma Father   . Hypertension Father   . Kidney disease Father   . Hypertension Sister   . Heart disease Sister   . Kidney disease Sister   . Heart disease Brother   . Hypertension Brother   . COPD Brother     SOCIAL HISTORY: Social History   Socioeconomic History  . Marital status: Single    Spouse name: Not on file  . Number of children: Not on file  . Years of education: Not on file  . Highest education level: Not on file  Social Needs  . Financial resource strain: Not on file  . Food insecurity - worry: Not on file  . Food insecurity - inability: Not on  file  . Transportation needs - medical: Not on file  . Transportation needs - non-medical: Not on file  Occupational History  . Not on file  Tobacco Use  . Smoking status: Never Smoker  . Smokeless tobacco: Never Used  Substance and Sexual Activity  . Alcohol use: No    Frequency: Never  . Drug use: No  . Sexual activity: Not on file  Other Topics Concern  . Not on file  Social History Narrative  . Not on file    Allergies  Allergen Reactions  . Clonidine Derivatives Swelling    Leg swelling  . Hydralazine Hcl Swelling    Leg swelling     Current Outpatient Medications  Medication Sig Dispense Refill  . aspirin EC 81 MG tablet Take 81 mg daily by mouth.    Marland Kitchen atorvastatin (LIPITOR) 40 MG tablet Take 40 mg at bedtime by mouth.    . carvedilol (COREG) 25 MG tablet Take 25 mg 2 (two) times daily with a meal by mouth.    . cholecalciferol (VITAMIN D) 1000 units tablet Take 1,000 Units by mouth 2 (two) times daily.    . furosemide (LASIX) 80 MG tablet Take 80 mg at bedtime by mouth.    . Lactobacillus Rhamnosus, GG, (CULTURELLE) CAPS Take 1 capsule by mouth daily.    Marland Kitchen  lisinopril (PRINIVIL,ZESTRIL) 40 MG tablet Take 40 mg daily by mouth.    . multivitamin (RENA-VIT) TABS tablet Take 1 tablet by mouth 2 (two) times daily.    . sevelamer carbonate (RENVELA) 800 MG tablet Take 1,600 mg by mouth See admin instructions. Take 2 tablets (1600 mg) three times daily with meals and with snacks     No current facility-administered medications for this visit.     REVIEW OF SYSTEMS:  [X]  denotes positive finding, [ ]  denotes negative finding Cardiac  Comments:  Chest pain or chest pressure:    Shortness of breath upon exertion:    Short of breath when lying flat:    Irregular heart rhythm:        Vascular    Pain in calf, thigh, or hip brought on by ambulation:    Pain in feet at night that wakes you up from your sleep:     Blood clot in your veins:    Leg swelling:           Pulmonary    Oxygen at home:    Productive cough:     Wheezing:         Neurologic    Sudden weakness in arms or legs:     Sudden numbness in arms or legs:     Sudden onset of difficulty speaking or slurred speech:    Temporary loss of vision in one eye:     Problems with dizziness:         Gastrointestinal    Blood in stool:     Vomited blood:         Genitourinary    Burning when urinating:     Blood in urine:        Psychiatric    Major depression:         Hematologic    Bleeding problems:    Problems with blood clotting too easily:        Skin    Rashes or ulcers:        Constitutional    Fever or chills:     PHYSICAL EXAM:   Vitals:   09/19/17 0855 09/19/17 0902  BP: (!) 171/112 (!) 165/106  Pulse: 96 96  Resp: 16   Temp: (!) 97 F (36.1 C)   TempSrc: Oral   SpO2: 100%   Weight: 134 lb (60.8 kg)   Height: 5\' 5"  (1.651 m)     GENERAL: The patient is a well-nourished male, in no acute distress. The vital signs are documented above. CARDIAC: There is a regular rate and rhythm.  VASCULAR: I do not detect carotid bruits. He has palpable brachial and radial pulses bilaterally. PULMONARY: There is good air exchange bilaterally without wheezing or rales. ABDOMEN: Soft and non-tender with normal pitched bowel sounds.  MUSCULOSKELETAL: There are no major deformities or cyanosis. NEUROLOGIC: No focal weakness or paresthesias are detected. SKIN: There are no ulcers or rashes noted. PSYCHIATRIC: The patient has a normal affect.  DATA:    UPPER EXTREMITY VEIN MAPPING: I have reviewed the upper extremity vein map that was done on 07/16/2017.  On the right side, the forearm cephalic vein is small.  The upper arm cephalic vein is not visualized.  On the left side the basilic vein is marginal in size.  On the left side, the cephalic vein does not appear to be adequate.  The basilic vein on the left looks reasonable in size.  MEDICAL ISSUES:   CHRONIC  KIDNEY  DISEASE: Based on my physical exam and review of his vein map, there is a small chance he would be a candidate for a left radiocephalic fistula.  More likely he will require a left basilic vein transposition.  If this were not possible that he would require placement of an AV graft. I have explained the indications for placement of an AV fistula or AV graft. I've explained that if at all possible we will place an AV fistula.  I have reviewed the risks of placement of an AV fistula including but not limited to: failure of the fistula to mature, need for subsequent interventions, and thrombosis. In addition I have reviewed the potential complications of placement of an AV graft. These risks include, but are not limited to, graft thrombosis, graft infection, wound healing problems, bleeding, arm swelling, and steal syndrome. All the patient's questions were answered and they are agreeable to proceed with surgery.  Because of an issue with his daughter he would not like to schedule his surgery until October 11, 2017.   HYPERTENSION: The patient's initial blood pressure today was elevated. We repeated this and this was still elevated. We have encouraged the patient to follow up with their primary care physician for management of their blood pressure.   Deitra Mayo [facility] [phone]

## 2017-09-19 NOTE — Progress Notes (Signed)
Vitals:   09/19/17 0855  BP: (!) 171/112  Pulse: 96  Resp: 16  Temp: (!) 97 F (36.1 C)  TempSrc: Oral  SpO2: 100%  Weight: 134 lb (60.8 kg)  Height: 5\' 5"  (1.651 m)

## 2017-09-25 ENCOUNTER — Encounter: Payer: Self-pay | Admitting: *Deleted

## 2017-10-01 ENCOUNTER — Encounter: Payer: Self-pay | Admitting: Physician Assistant

## 2017-10-01 NOTE — Progress Notes (Deleted)
Cardiology Office Note    Date:  10/01/2017  ID:  Faizon, Capozzi 1970-11-29, MRN [id number] PCP:  Gwenlyn Saran, MD  Cardiologist:  Dr. Angelena Form   Chief Complaint: pre-operative evaluation  History of Present Illness:  Angel [name] is a 47 y.o. male with history of ESRD on HD, chronic anemia, HTN, CVA, seizure, AAA s/p repair, recently diagnosed NSTEMI/acute combined CHF (prior LVEF not known, recently 25-30%), small pericardial effusion who presents for post-hospital follow-up. He was admitted 06/2017 with hypertensive urgency and dyspnea in the setting of missing HD. He ruled in for NSTEMI with troponin of 4.3. He was also found to have anemia Hgb 6.7 requiring transfusion, iron, and aranesp. 2D echo 06/26/17 showed EF 25-30%, moderate LVH, severe diffuse HK, grade 2 DD, moderately dilated LA, moderate TR, small pericardial effusion. He was treated with heparin gtt, statin, aspirin, BB, lisinopril. Cardiac cath recommended but patient refused and wanted to leave against medical advice. Apparently he was going to be homeless at time of leaving. He has had difficulty going to outpatient dialysis. He has been coming to the ED to receive dialysis via catheter in right chest. He was readmitted 07/23/17 with recurrent hypertensive urgency and uremia and again on 07/30/17. Apparently he will be undergoing basilic vein transposition versus graft insertion and we are asked to see him for pre-operative clearance. Last pertinent labs include Hgb 9.1, Cr 18, normal TSH, no prior lipids (07/2017).  lipid profile cath aspirin  Pre-operative evaluation Chronic combined CHF  Recent NSTEMI  ESRD on HD  Pericardial effusion    Past Medical History:  Diagnosis Date  . AAA (abdominal aortic aneurysm) ([place])   . Chronic anemia   . Chronic combined systolic and diastolic CHF (congestive heart failure) ([place])   . ESRD on dialysis ([place])   . FSGS (focal segmental glomerulosclerosis)   .  GERD (gastroesophageal reflux disease)   . HLD (hyperlipidemia)   . Hypertension   . NSTEMI (non-ST elevated myocardial infarction) ([place]) 07/2017   pt refused cath  . Pericardial effusion    a. small by echo 06/2017.  Marland Kitchen Renal disorder   . Seizure ([place])   . Stroke (cerebrum) [facility])     Past Surgical History:  Procedure Laterality Date  . ABDOMINAL AORTIC ANEURYSM REPAIR    . KIDNEY SURGERY      Current Medications: No outpatient medications have been marked as taking for the 10/03/17 encounter (Appointment) with Charlie Pitter, PA-C.   ***   Allergies:   Clonidine derivatives and Hydralazine hcl   Social History   Socioeconomic History  . Marital status: Single    Spouse name: Not on file  . Number of children: Not on file  . Years of education: Not on file  . Highest education level: Not on file  Social Needs  . Financial resource strain: Not on file  . Food insecurity - worry: Not on file  . Food insecurity - inability: Not on file  . Transportation needs - medical: Not on file  . Transportation needs - non-medical: Not on file  Occupational History  . Not on file  Tobacco Use  . Smoking status: Never Smoker  . Smokeless tobacco: Never Used  Substance and Sexual Activity  . Alcohol use: No    Frequency: Never  . Drug use: No  . Sexual activity: Not on file  Other Topics Concern  . Not on file  Social History Narrative  . Not on file  Family History:  Family History  Problem Relation Age of Onset  . Hypertension Mother   . Kidney disease Mother   . Asthma Father   . Hypertension Father   . Kidney disease Father   . Hypertension Sister   . Heart disease Sister   . Kidney disease Sister   . Heart disease Brother   . Hypertension Brother   . COPD Brother    ***  ROS:   Please see the history of present illness. Otherwise, review of systems is positive for ***.  All other systems are reviewed and otherwise negative.    PHYSICAL EXAM:   VS:   There were no vitals taken for this visit.  BMI: There is no height or weight on file to calculate BMI. GEN: Well nourished, well developed, in no acute distress  HEENT: normocephalic, atraumatic Neck: no JVD, carotid bruits, or masses Cardiac: ***RRR; no murmurs, rubs, or gallops, no edema  Respiratory:  clear to auscultation bilaterally, normal work of breathing GI: soft, nontender, nondistended, + BS MS: no deformity or atrophy  Skin: warm and dry, no rash Neuro:  Alert and Oriented x 3, Strength and sensation are intact, follows commands Psych: euthymic mood, full affect  Wt Readings from Last 3 Encounters:  09/19/17 134 lb (60.8 kg)  08/04/17 150 lb (68 kg)  07/30/17 142 lb 10.2 oz (64.7 kg)      Studies/Labs Reviewed:   EKG:  EKG was ordered today and personally reviewed by me and demonstrates *** EKG was not ordered today.***  Recent Labs: 06/25/2017: TSH 2.543 08/04/2017: ALT 56; BUN 113; Creatinine, Ser 18.15; Hemoglobin 9.1; Platelets 235; Potassium 4.8; Sodium 141   Lipid Panel No results found for: CHOL, TRIG, HDL, CHOLHDL, VLDL, LDLCALC, LDLDIRECT  Additional studies/ records that were reviewed today include: Summarized above.***    ASSESSMENT & PLAN:   1. ***  Disposition: F/u with ***   Medication Adjustments/Labs and Tests Ordered: Current medicines are reviewed at length with the patient today.  Concerns regarding medicines are outlined above. Medication changes, Labs and Tests ordered today are summarized above and listed in the Patient Instructions accessible in Encounters.   Signed, Charlie Pitter, PA-C  10/01/2017 4:13 PM    [facility] [place], [place], [place]  [address] Phone: [phone]; Fax: [phone]

## 2017-10-03 ENCOUNTER — Ambulatory Visit: Payer: Medicare Other | Admitting: Physician Assistant

## 2017-10-16 ENCOUNTER — Encounter: Payer: Self-pay | Admitting: *Deleted

## 2017-10-23 ENCOUNTER — Ambulatory Visit: Payer: Medicare Other | Admitting: Physician Assistant

## 2017-10-23 DIAGNOSIS — R0989 Other specified symptoms and signs involving the circulatory and respiratory systems: Secondary | ICD-10-CM

## 2017-10-23 NOTE — Progress Notes (Deleted)
Cardiology Office Note    Date:  10/23/2017  ID:  Angel [name], DOB [birth year], MRN [id number] PCP:  Gwenlyn Saran, MD  Cardiologist:  Dr. Angelena Form   Chief Complaint: pre-operative evaluation   History of Present Illness:  Angel [name] is a 47 y.o. male with history of ESRD on HD, chronic anemia, HTN, CVA, seizure, AAA s/p repair, recently diagnosed NSTEMI/acute combined CHF (prior LVEF not known, recently 25-30%), small pericardial effusion who presents for post-hospital follow-up. He was admitted 06/2017 with hypertensive urgency and dyspnea in the setting of missing HD. He ruled in for NSTEMI with troponin of 4.3. He was also found to have anemia Hgb 6.7 requiring transfusion, iron, and aranesp. 2D echo 06/26/17 showed EF 25-30%, moderate LVH, severe diffuse HK, grade 2 DD, moderately dilated LA, moderate TR, small pericardial effusion. He was treated with heparin gtt, statin, aspirin, BB, lisinopril. Cardiac cath recommended but patient refused and wanted to leave against medical advice. Apparently he was going to be homeless at time of leaving. He has had difficulty going to outpatient dialysis. He has been coming to the ED to receive dialysis via catheter in right chest. He was readmitted 07/23/17 with recurrent hypertensive urgency and uremia and again on 07/30/17. Apparently he will be undergoing basilic vein transposition versus graft insertion and we are asked to see him for pre-operative clearance. Last pertinent labs include Hgb 9.1, Cr 18, normal TSH, no prior lipids (07/2017).   lipid profile  cath  aspirin   Pre-operative evaluation  Chronic combined CHF  Recent NSTEMI  ESRD on HD  Pericardial effusion    Past Medical History:  Diagnosis Date  . AAA (abdominal aortic aneurysm) ([place])   . Chronic anemia   . Chronic combined systolic and diastolic CHF (congestive heart failure) ([place])   . ESRD on dialysis ([place])   . FSGS (focal segmental glomerulosclerosis)     . GERD (gastroesophageal reflux disease)   . HLD (hyperlipidemia)   . Hypertension   . NSTEMI (non-ST elevated myocardial infarction) ([place]) 07/2017   pt refused cath  . Pericardial effusion    a. small by echo 06/2017.  Marland Kitchen Renal disorder   . Seizure ([place])   . Stroke (cerebrum) [facility])     Past Surgical History:  Procedure Laterality Date  . ABDOMINAL AORTIC ANEURYSM REPAIR    . KIDNEY SURGERY      Current Medications: No outpatient medications have been marked as taking for the 10/23/17 encounter (Appointment) with Charlie Pitter, PA-C.   ***   Allergies:   Clonidine derivatives and Hydralazine hcl   Social History   Socioeconomic History  . Marital status: Single    Spouse name: Not on file  . Number of children: Not on file  . Years of education: Not on file  . Highest education level: Not on file  Social Needs  . Financial resource strain: Not on file  . Food insecurity - worry: Not on file  . Food insecurity - inability: Not on file  . Transportation needs - medical: Not on file  . Transportation needs - non-medical: Not on file  Occupational History  . Not on file  Tobacco Use  . Smoking status: Never Smoker  . Smokeless tobacco: Never Used  Substance and Sexual Activity  . Alcohol use: No    Frequency: Never  . Drug use: No  . Sexual activity: Not on file  Other Topics Concern  . Not on file  Social History  Narrative  . Not on file     Family History:  Family History  Problem Relation Age of Onset  . Hypertension Mother   . Kidney disease Mother   . Asthma Father   . Hypertension Father   . Kidney disease Father   . Hypertension Sister   . Heart disease Sister   . Kidney disease Sister   . Heart disease Brother   . Hypertension Brother   . COPD Brother    ***  ROS:   Please see the history of present illness. Otherwise, review of systems is positive for ***.  All other systems are reviewed and otherwise negative.    PHYSICAL EXAM:   VS:   There were no vitals taken for this visit.  BMI: There is no height or weight on file to calculate BMI. GEN: Well nourished, well developed, in no acute distress HEENT: normocephalic, atraumatic Neck: no JVD, carotid bruits, or masses Cardiac: ***RRR; no murmurs, rubs, or gallops, no edema  Respiratory:  clear to auscultation bilaterally, normal work of breathing GI: soft, nontender, nondistended, + BS MS: no deformity or atrophy Skin: warm and dry, no rash Neuro:  Alert and Oriented x 3, Strength and sensation are intact, follows commands Psych: euthymic mood, full affect  Wt Readings from Last 3 Encounters:  09/19/17 134 lb (60.8 kg)  08/04/17 150 lb (68 kg)  07/30/17 142 lb 10.2 oz (64.7 kg)      Studies/Labs Reviewed:   EKG:  EKG was ordered today and personally reviewed by me and demonstrates *** EKG was not ordered today.***  Recent Labs: 06/25/2017: TSH 2.543 08/04/2017: ALT 56; BUN 113; Creatinine, Ser 18.15; Hemoglobin 9.1; Platelets 235; Potassium 4.8; Sodium 141   Lipid Panel No results found for: CHOL, TRIG, HDL, CHOLHDL, VLDL, LDLCALC, LDLDIRECT  Additional studies/ records that were reviewed today include: Summarized above.***    ASSESSMENT & PLAN:   1. ***  Disposition: F/u with ***   Medication Adjustments/Labs and Tests Ordered: Current medicines are reviewed at length with the patient today.  Concerns regarding medicines are outlined above. Medication changes, Labs and Tests ordered today are summarized above and listed in the Patient Instructions accessible in Encounters.   Signed, Charlie Pitter, PA-C  10/23/2017 12:01 PM    [facility] [address], [place], [place]  [address] Phone: [phone]; Fax: [phone]

## 2017-10-24 ENCOUNTER — Encounter: Payer: Self-pay | Admitting: Physician Assistant

## 2017-10-25 ENCOUNTER — Encounter (HOSPITAL_COMMUNITY): Admission: RE | Payer: Self-pay | Source: Ambulatory Visit

## 2017-10-25 ENCOUNTER — Ambulatory Visit (HOSPITAL_COMMUNITY): Admission: RE | Admit: 2017-10-25 | Payer: Medicare Other | Source: Ambulatory Visit | Admitting: Vascular Surgery

## 2017-10-25 SURGERY — TRANSPOSITION, VEIN, BASILIC
Anesthesia: General | Laterality: Left

## 2017-11-08 ENCOUNTER — Telehealth: Payer: Self-pay | Admitting: Oncology

## 2017-11-08 NOTE — Telephone Encounter (Signed)
Consult for Anemia. Ref by Dr. [facility]. Appt conf with s/w Angie @ ref office. New patient pkt given to Registration for patient to complete upon arrival.  Notes in Book. Per Angie, additional notes will be faxed to [facility] later today. MF

## 2017-11-13 ENCOUNTER — Other Ambulatory Visit: Payer: Self-pay

## 2017-11-13 ENCOUNTER — Inpatient Hospital Stay: Payer: Medicare Other | Attending: Oncology | Admitting: Oncology

## 2017-11-13 ENCOUNTER — Encounter: Payer: Self-pay | Admitting: Oncology

## 2017-11-13 VITALS — BP 159/104 | HR 92 | Temp 98.5°F | Resp 16 | Wt 132.2 lb

## 2017-11-13 DIAGNOSIS — Z8673 Personal history of transient ischemic attack (TIA), and cerebral infarction without residual deficits: Secondary | ICD-10-CM | POA: Diagnosis not present

## 2017-11-13 DIAGNOSIS — R5383 Other fatigue: Secondary | ICD-10-CM

## 2017-11-13 DIAGNOSIS — R5381 Other malaise: Secondary | ICD-10-CM | POA: Insufficient documentation

## 2017-11-13 DIAGNOSIS — Z79899 Other long term (current) drug therapy: Secondary | ICD-10-CM | POA: Diagnosis not present

## 2017-11-13 DIAGNOSIS — Z992 Dependence on renal dialysis: Secondary | ICD-10-CM | POA: Insufficient documentation

## 2017-11-13 DIAGNOSIS — R21 Rash and other nonspecific skin eruption: Secondary | ICD-10-CM | POA: Diagnosis not present

## 2017-11-13 DIAGNOSIS — D631 Anemia in chronic kidney disease: Secondary | ICD-10-CM | POA: Diagnosis not present

## 2017-11-13 DIAGNOSIS — Z7982 Long term (current) use of aspirin: Secondary | ICD-10-CM | POA: Insufficient documentation

## 2017-11-13 DIAGNOSIS — I5042 Chronic combined systolic (congestive) and diastolic (congestive) heart failure: Secondary | ICD-10-CM | POA: Diagnosis not present

## 2017-11-13 DIAGNOSIS — E785 Hyperlipidemia, unspecified: Secondary | ICD-10-CM | POA: Diagnosis not present

## 2017-11-13 DIAGNOSIS — K219 Gastro-esophageal reflux disease without esophagitis: Secondary | ICD-10-CM | POA: Insufficient documentation

## 2017-11-13 DIAGNOSIS — N186 End stage renal disease: Secondary | ICD-10-CM | POA: Insufficient documentation

## 2017-11-13 DIAGNOSIS — I132 Hypertensive heart and chronic kidney disease with heart failure and with stage 5 chronic kidney disease, or end stage renal disease: Secondary | ICD-10-CM | POA: Diagnosis present

## 2017-11-13 DIAGNOSIS — I252 Old myocardial infarction: Secondary | ICD-10-CM | POA: Diagnosis not present

## 2017-11-13 NOTE — Progress Notes (Signed)
New patient in for anemia, states feeling fatigued and tired all the time.  Pt's BP is elevated 159/104, pt states he took his BP med but his is always around 180/105.

## 2017-11-13 NOTE — Progress Notes (Signed)
Hematology/Oncology Consult note [facility] Telephone:([phone] [phone]   Patient Care Team: Gwenlyn Saran, MD as PCP - General (Family Medicine)  REFERRING PROVIDER: Dr.Singh Sheilah Mins CHIEF COMPLAINTS/PURPOSE OF CONSULTATION:  Evaluation of anemia  HISTORY OF PRESENTING ILLNESS:  Angel [name] is a  47 y.o.  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  Patient reports that he has been getting Epogen with dialysis for about a year and a half.  Recently he has developed skin rash on buttock, also field questionable throat itching after he received Epogen injection.  Epogen was hold.  With further questioning, patient reports using new body wash during the same time period.  Patient has anemia of chronic kidney disease and was referred to [facility] for further evaluation. Patient reports feeling fatigue.  Denies any hematochezia or hematemesis, black tarry stool.  His rash improved after topical steroid cream  Review of Systems  Constitutional: Positive for malaise/fatigue. Negative for chills and fever.  HENT: Negative for ear discharge and ear pain.   Eyes: Negative for double vision and photophobia.  Respiratory: Negative for cough and sputum production.   Cardiovascular: Negative for chest pain.  Gastrointestinal: Negative for abdominal pain, blood in stool, nausea and vomiting.  Genitourinary: Negative for dysuria.  Musculoskeletal: Negative for myalgias.  Skin: Positive for rash.  Neurological: Negative for dizziness, tingling, sensory change and speech change.  Endo/Heme/Allergies: Does not bruise/bleed easily.  Psychiatric/Behavioral: Negative for depression.    MEDICAL HISTORY:  Past Medical History:  Diagnosis Date  . AAA (abdominal aortic aneurysm) ([place])   . Chronic anemia   . Chronic combined systolic and  diastolic CHF (congestive heart failure) ([place])   . ESRD on dialysis ([place])   . FSGS (focal segmental glomerulosclerosis)   . GERD (gastroesophageal reflux disease)   . HLD (hyperlipidemia)   . Hypertension   . NSTEMI (non-ST elevated myocardial infarction) ([place]) 07/2017   pt refused cath  . Pericardial effusion    a. small by echo 06/2017.  Marland Kitchen Renal disorder   . Seizure ([place])   . Stroke (cerebrum) [facility])     SURGICAL HISTORY: Past Surgical History:  Procedure Laterality Date  . ABDOMINAL AORTIC ANEURYSM REPAIR    . KIDNEY SURGERY      SOCIAL HISTORY: Social History   Socioeconomic History  . Marital status: Single    Spouse name: Not on file  . Number of children: Not on file  . Years of education: Not on file  . Highest education level: Not on file  Occupational History  . Not on file  Social Needs  . Financial resource strain: Not on file  . Food insecurity:    Worry: Not on file    Inability: Not on file  . Transportation needs:    Medical: Not on file    Non-medical: Not on file  Tobacco Use  . Smoking status: Never Smoker  . Smokeless tobacco: Never Used  Substance and Sexual Activity  . Alcohol use: No    Frequency: Never  . Drug use: No  . Sexual activity: Not on file  Lifestyle  . Physical activity:    Days per week: Not on file    Minutes per session: Not on file  . Stress: Not on file  Relationships  . Social connections:    Talks on phone: Not on file  Gets together: Not on file    Attends religious service: Not on file    Active member of club or organization: Not on file    Attends meetings of clubs or organizations: Not on file    Relationship status: Not on file  . Intimate partner violence:    Fear of current or ex partner: Not on file    Emotionally abused: Not on file    Physically abused: Not on file    Forced sexual activity: Not on file  Other Topics Concern  . Not on file  Social History Narrative  . Not on file    FAMILY  HISTORY: Family History  Problem Relation Age of Onset  . Hypertension Mother   . Kidney disease Mother   . Asthma Father   . Hypertension Father   . Kidney disease Father   . Hypertension Sister   . Heart disease Sister   . Kidney disease Sister   . Heart disease Brother   . Hypertension Brother   . COPD Brother     ALLERGIES:  is allergic to clonidine derivatives and hydralazine hcl.  MEDICATIONS:  Current Outpatient Medications  Medication Sig Dispense Refill  . aspirin EC 81 MG tablet Take 81 mg daily by mouth.    Marland Kitchen atorvastatin (LIPITOR) 40 MG tablet Take 40 mg at bedtime by mouth.    . carvedilol (COREG) 25 MG tablet Take 25 mg 2 (two) times daily with a meal by mouth.    . cholecalciferol (VITAMIN D) 1000 units tablet Take 1,000 Units by mouth 2 (two) times daily.    . cloNIDine (CATAPRES) 0.2 MG tablet Take 0.2 mg by mouth 2 (two) times daily.  23  . furosemide (LASIX) 80 MG tablet Take 80 mg at bedtime by mouth.    . Lactobacillus Rhamnosus, GG, (CULTURELLE) CAPS Take 1 capsule by mouth daily.    Marland Kitchen losartan (COZAAR) 50 MG tablet Take 50 mg by mouth every evening.  5  . multivitamin (RENA-VIT) TABS tablet Take 1 tablet by mouth 2 (two) times daily.    . sevelamer carbonate (RENVELA) 800 MG tablet Take 1,600 mg by mouth See admin instructions. Take 2 tablets (1600 mg) three times daily with meals and with snacks     No current facility-administered medications for this visit.      PHYSICAL EXAMINATION: ECOG PERFORMANCE STATUS: 1 - Symptomatic but completely ambulatory Vitals:   11/13/17 0956  BP: (!) 159/104  Pulse: 92  Resp: 16  Temp: 98.5 F (36.9 C)  SpO2: 99%   Filed Weights   11/13/17 0956  Weight: 132 lb 3 oz (60 kg)    Physical Exam  Constitutional: He is oriented to person, place, and time. He appears well-developed and well-nourished.  HENT:  Head: Normocephalic and atraumatic.  Eyes: Pupils are equal, round, and reactive to light. EOM are  normal.  Neck: Normal range of motion. Neck supple.  Cardiovascular: Normal rate, regular rhythm and normal heart sounds.  Pulmonary/Chest: Effort normal and breath sounds normal.  Abdominal: Bowel sounds are normal. He exhibits no mass. There is no guarding.  Musculoskeletal: Normal range of motion.  Neurological: He is alert and oriented to person, place, and time. He displays normal reflexes. Coordination normal.  Skin: Skin is warm and dry. No erythema.     LABORATORY DATA:  I have reviewed the data as listed Lab Results  Component Value Date   WBC 7.6 08/04/2017   HGB 9.1 (L) 08/04/2017  HCT 28.5 (L) 08/04/2017   MCV 82.4 08/04/2017   PLT 235 08/04/2017   Recent Labs    07/23/17 0024  07/24/17 0733 07/30/17 0216 08/04/17 0951  NA 140   < > 138 139 141  K 5.0   < > 4.6 5.7* 4.8  CL 102   < > 100* 104 102  CO2 17*   < > 20* 17* 20*  GLUCOSE 102*   < > 102* 145* 119*  BUN 121*   < > 140* 132* 113*  CREATININE 18.57*   < > 19.87* 19.03* 18.15*  CALCIUM 9.0   < > 8.6* 9.1 9.9  GFRNONAA 3*   < > 2* 3* 3*  GFRAA 3*   < > 3* 3* 3*  PROT 6.4*  --   --  6.4* 7.1  ALBUMIN 3.1*  --  3.1* 3.0* 3.3*  AST 44*  --   --  39 38  ALT 60  --   --  49 56  ALKPHOS 152*  --   --  158* 132*  BILITOT 1.1  --   --  1.2 0.9   < > = values in this interval not displayed.       ASSESSMENT & PLAN:  1. End-stage renal disease needing dialysis ([place])   2. Anemia in ESRD (end-stage renal disease) ([place])    Discussed with patient that an anemia is most likely secondary to ESRD.  Will check labs to rule out other etiologies.  Check CBC, iron, ferritin, B12, folate, SPEP and free light chain ratio. Is unclear whether he has a true allergic reaction to Epogen or due to other etiology.  Patient does not have good peripheral access.  I gave him [facility] prescription so that he can bring to hemodialysis center and have labs checked and faxed to me.  I will see him in a week or so to discuss about  lab results. All questions were answered. The patient knows to call the clinic with any problems questions or concerns.  Return of visit: 1 week. Thank you for this kind referral and the opportunity to participate in the care of this patient. A copy of today's note is routed to referring provider    Earlie Server, MD, PhD Hematology Oncology [facility] Pager- [phone] 11/13/2017

## 2017-11-23 ENCOUNTER — Ambulatory Visit: Payer: Medicare Other | Admitting: Oncology

## 2017-11-28 NOTE — Progress Notes (Signed)
Cardiology Office Note    Date:  11/29/2017  ID:  Clem, Wisenbaker 01/10/1971, MRN [id number] PCP:  Gwenlyn Saran, MD  Cardiologist:  Dr. Angelena Form   Chief Complaint: chest pain  History of Present Illness:  Angel [name] is a 47 y.o. male with history of ESRD on HD, chronic anemia, HTN, CVA, seizure, AAA s/p repair, NSTEMI/acute combined CHF 06/2017 (prior LVEF not known, recently 25-30%), small pericardial effusion who presents for post-hospital follow-up. He was admitted 06/2017 with hypertensive urgency and dyspnea in the setting of missing HD. He ruled in for NSTEMI with troponin of 4.3. He was also found to have anemia Hgb 6.7 requiring transfusion, iron, and Aranesp. 2D echo 06/26/17 showed EF 25-30%, moderate LVH, severe diffuse HK, grade 2 DD, moderately dilated LA, moderate TR, small pericardial effusion. He was treated with heparin gtt, statin, aspirin, BB, lisinopril. Cardiac cath recommended but patient refused and wanted to leave against medical advice. Apparently he was going to be homeless at time of leaving. He has had difficulty going to outpatient dialysis. He has been coming to the ED to receive dialysis via catheter in right chest. He was readmitted 07/23/17 with recurrent hypertensive urgency and uremia and again on 07/30/17. Apparently he will be undergoing basilic vein transposition versus graft insertion and we are asked to see him for pre-operative clearance. Last pertinent labs include Hgb 9.1, Cr 18, normal TSH, no prior lipids (07/2017). He has cancelled and no-showed numerous appointments since his hospitalization. His mother is followed in our practice. He dialyzes through R HD catheter in chest wall.  He returns for follow-up today. He states he is now established with HD MWF in [place] and reports going faithfully. He reports BP yesterday afternoon was 116/84 but he ate half a box of Cheez Its in the afternoon and thinks that's what's driving his BP  up. He reports compliance with his medication including clonidine without any missed doses. He is back to mountain biking and denies any CP, SOB, palpitations, edema, orthopnea, weight gain, or syncope. He reports he did take his BP medication earlier this AM. Recheck BP by me was similar to tech.  Past Medical History:  Diagnosis Date  . AAA (abdominal aortic aneurysm) ([place])   . Chronic anemia   . Chronic combined systolic and diastolic CHF (congestive heart failure) ([place])   . ESRD on dialysis ([place])   . FSGS (focal segmental glomerulosclerosis)   . GERD (gastroesophageal reflux disease)   . HLD (hyperlipidemia)   . Hypertension   . NSTEMI (non-ST elevated myocardial infarction) ([place]) 07/2017   pt refused cath  . Pericardial effusion    a. small by echo 06/2017.  Marland Kitchen Renal disorder   . Seizure ([place])   . Stroke (cerebrum) [facility])     Past Surgical History:  Procedure Laterality Date  . ABDOMINAL AORTIC ANEURYSM REPAIR    . KIDNEY SURGERY      Current Medications: Current Meds  Medication Sig  . aspirin EC 81 MG tablet Take 81 mg daily by mouth.  Marland Kitchen atorvastatin (LIPITOR) 40 MG tablet Take 40 mg at bedtime by mouth.  . carvedilol (COREG) 25 MG tablet Take 25 mg 2 (two) times daily with a meal by mouth.  . cholecalciferol (VITAMIN D) 1000 units tablet Take 1,000 Units by mouth 2 (two) times daily.  . cloNIDine (CATAPRES) 0.2 MG tablet Take 0.2 mg by mouth 2 (two) times daily.  . furosemide (LASIX) 80 MG tablet Take 80  mg at bedtime by mouth.  . hydrALAZINE (APRESOLINE) 100 MG tablet Take 100 mg by mouth 3 (three) times daily.  . Lactobacillus Rhamnosus, GG, (CULTURELLE) CAPS Take 1 capsule by mouth daily.  Marland Kitchen losartan (COZAAR) 50 MG tablet Take 50 mg by mouth every evening.  . multivitamin (RENA-VIT) TABS tablet Take 1 tablet by mouth 2 (two) times daily.   Hydralazine 100mg  1 tablet three times a day  . sevelamer carbonate (RENVELA) 800 MG tablet Take 1,600 mg by mouth See admin  instructions. Take 2 tablets (1600 mg) three times daily with meals and with snacks    Allergies:   Clonidine derivatives and Hydralazine hcl   Social History   Socioeconomic History  . Marital status: Single    Spouse name: Not on file  . Number of children: Not on file  . Years of education: Not on file  . Highest education level: Not on file  Occupational History  . Not on file  Social Needs  . Financial resource strain: Not on file  . Food insecurity:    Worry: Not on file    Inability: Not on file  . Transportation needs:    Medical: Not on file    Non-medical: Not on file  Tobacco Use  . Smoking status: Never Smoker  . Smokeless tobacco: Never Used  Substance and Sexual Activity  . Alcohol use: No    Frequency: Never  . Drug use: No  . Sexual activity: Not on file  Lifestyle  . Physical activity:    Days per week: Not on file    Minutes per session: Not on file  . Stress: Not on file  Relationships  . Social connections:    Talks on phone: Not on file    Gets together: Not on file    Attends religious service: Not on file    Active member of club or organization: Not on file    Attends meetings of clubs or organizations: Not on file    Relationship status: Not on file  Other Topics Concern  . Not on file  Social History Narrative  . Not on file     Family History:  Family History  Problem Relation Age of Onset  . Hypertension Mother   . Kidney disease Mother   . Asthma Father   . Hypertension Father   . Kidney disease Father   . Hypertension Sister   . Heart disease Sister   . Kidney disease Sister   . Heart disease Brother   . Hypertension Brother   . COPD Brother     ROS:   Please see the history of present illness. Otherwise, review of systems is positive for constipation relieved with stool softener All other systems are reviewed and otherwise negative.    PHYSICAL EXAM:   VS:  BP (!) 168/108   Pulse 84   Ht 5\' 5"  (1.651 m)   Wt 134  lb 12.8 oz (61.1 kg)   SpO2 94%   BMI 22.43 kg/m   BMI: Body mass index is 22.43 kg/m. GEN: Thin AAM in no acute distress  HEENT: normocephalic, atraumatic Neck: no JVD, carotid bruits, or masses Cardiac: RRR; no murmurs, rubs, or gallops, no edema  Respiratory:  clear to auscultation bilaterally, normal work of breathing GI: soft, nontender, nondistended, + BS MS: no deformity or atrophy. R HD chest wall catheter Skin: warm and dry, no rash Neuro:  Alert and Oriented x 3, Strength and sensation are intact, follows  commands Psych: euthymic mood, full affect  Wt Readings from Last 3 Encounters:  11/29/17 134 lb 12.8 oz (61.1 kg)  11/13/17 132 lb 3 oz (60 kg)  09/19/17 134 lb (60.8 kg)      Studies/Labs Reviewed:   EKG:  EKG was ordered today and personally reviewed by me and demonstrates NSR 84bpm, TWI I, II, avL, V4-V6, flattening avF  Recent Labs: 06/25/2017: TSH 2.543 08/04/2017: ALT 56; BUN 113; Creatinine, Ser 18.15; Hemoglobin 9.1; Platelets 235; Potassium 4.8; Sodium 141   Lipid Panel No results found for: CHOL, TRIG, HDL, CHOLHDL, VLDL, LDLCALC, LDLDIRECT  Additional studies/ records that were reviewed today include: Summarized above    ASSESSMENT & PLAN:   1. Pre-operative evaluation - I discussed his case in depth with Dr. Acie Fredrickson (DOD) given history of NSTEMI. This occurred in the setting of multiple physiologic stressors. We are reluctant to proceed with cath given comorbidities, lack of angina, and lack of consistent follow-up in the past. Therefore we would recommend to start with Lexiscan nuclear stress test to risk stratify. This will also help assess his compliance with plan for testing and follow-up. Continue aspirin, beta blocker, statin. 2. Essential HTN - BP is poorly controlled. I am perplexed as to why it is so high despite reported compliance with 5 BP medications as well as reported compliance with HD. He thinks this is due to eating half a box of  Cheez-Its. Discussed with Dr. Acie Fredrickson - as the patient reports his BP was 116/84 yesterday, Dr. Acie Fredrickson recommends the patient contact his kidney doctor/dialysis center to discuss further med titration as they would have a more comprehensive confirmed record of recent BP to help guide med titration, perhaps on non-HD days. The patient is amenable to this plan. Imdur could be considered as an addition given his cardiomyopathy. 3. Chronic combined CHF - appears euvolemic. Reviewed 2g sodium restriction, 2L fluid restriction, daily weights with patient. 4. Recent NSTEMI - see above. Plan nuc. 5. ESRD on HD - dialyzes MWF. Pending pre-op clearance with nuc. 6. Pericardial effusion - follow clinically. No clinical signs of tamponade, suspect this was due to uremia/missed HD.  Disposition: F/u with Dr. McAlhany/care team APP after nuclear stress testing.   Medication Adjustments/Labs and Tests Ordered: Current medicines are reviewed at length with the patient today.  Concerns regarding medicines are outlined above. Medication changes, Labs and Tests ordered today are summarized above and listed in the Patient Instructions accessible in Encounters.   Signed, Charlie Pitter, PA-C  11/29/2017 10:24 AM    [address], [place], [place]  [address] Phone: [phone]; Fax: [phone]

## 2017-11-29 ENCOUNTER — Encounter: Payer: Self-pay | Admitting: *Deleted

## 2017-11-29 ENCOUNTER — Encounter: Payer: Self-pay | Admitting: Physician Assistant

## 2017-11-29 ENCOUNTER — Ambulatory Visit (INDEPENDENT_AMBULATORY_CARE_PROVIDER_SITE_OTHER): Payer: Medicare Other | Admitting: Physician Assistant

## 2017-11-29 VITALS — BP 168/108 | HR 84 | Ht 65.0 in | Wt 134.8 lb

## 2017-11-29 DIAGNOSIS — I313 Pericardial effusion (noninflammatory): Secondary | ICD-10-CM | POA: Diagnosis not present

## 2017-11-29 DIAGNOSIS — I5042 Chronic combined systolic (congestive) and diastolic (congestive) heart failure: Secondary | ICD-10-CM

## 2017-11-29 DIAGNOSIS — N186 End stage renal disease: Secondary | ICD-10-CM

## 2017-11-29 DIAGNOSIS — Z01810 Encounter for preprocedural cardiovascular examination: Secondary | ICD-10-CM | POA: Diagnosis not present

## 2017-11-29 DIAGNOSIS — I10 Essential (primary) hypertension: Secondary | ICD-10-CM

## 2017-11-29 DIAGNOSIS — Z992 Dependence on renal dialysis: Secondary | ICD-10-CM

## 2017-11-29 DIAGNOSIS — I214 Non-ST elevation (NSTEMI) myocardial infarction: Secondary | ICD-10-CM | POA: Diagnosis not present

## 2017-11-29 DIAGNOSIS — I3139 Other pericardial effusion (noninflammatory): Secondary | ICD-10-CM

## 2017-11-29 NOTE — Patient Instructions (Addendum)
Medication Instructions:  Your physician recommends that you continue on your current medications as directed. Please refer to the Current Medication list given to you today.   Labwork: None ordered  Testing/Procedures: Your physician has requested that you have a lexiscan myoview. For further information please visit [email or web address]. Please follow instruction sheet, as given.    Follow-Up: Your physician recommends that you schedule a follow-up appointment in: [place] DR. Angelena Form OR ONE OF HIS CARE TEAM APPS ON A DAY HE IS IN THE OFFICE   Any Other Special Instructions Will Be Listed Below (If Applicable). CALL YOUR KIDNEY DR ABOUT YOUR BLOOD PRESSURE MEDICATION CHANGE   Cardiac Nuclear Scan A cardiac nuclear scan is a test that measures blood flow to the heart when a person is resting and when he or she is exercising. The test looks for problems such as:  Not enough blood reaching a portion of the heart.  The heart muscle not working normally.  You may need this test if:  You have heart disease.  You have had abnormal lab results.  You have had heart surgery or angioplasty.  You have chest pain.  You have shortness of breath.  In this test, a radioactive dye (tracer) is injected into your bloodstream. After the tracer has traveled to your heart, an imaging device is used to measure how much of the tracer is absorbed by or distributed to various areas of your heart. This procedure is usually done at a hospital and takes 2-4 hours. Tell a health care provider about:  Any allergies you have.  All medicines you are taking, including vitamins, herbs, eye drops, creams, and over-the-counter medicines.  Any problems you or family members have had with the use of anesthetic medicines.  Any blood disorders you have.  Any surgeries you have had.  Any medical conditions you have.  Whether you are pregnant or may be pregnant. What are the  risks? Generally, this is a safe procedure. However, problems may occur, including:  Serious chest pain and heart attack. This is only a risk if the stress portion of the test is done.  Rapid heartbeat.  Sensation of warmth in your chest. This usually passes quickly.  What happens before the procedure?  Ask your health care provider about changing or stopping your regular medicines. This is especially important if you are taking diabetes medicines or blood thinners.  Remove your jewelry on the day of the procedure. What happens during the procedure?  An IV tube will be inserted into one of your veins.  Your health care provider will inject a small amount of radioactive tracer through the tube.  You will wait for 20-40 minutes while the tracer travels through your bloodstream.  Your heart activity will be monitored with an electrocardiogram (ECG).  You will lie down on an exam table.  Images of your heart will be taken for about 15-20 minutes.  You may be asked to exercise on a treadmill or stationary bike. While you exercise, your heart's activity will be monitored with an ECG, and your blood pressure will be checked. If you are unable to exercise, you may be given a medicine to increase blood flow to parts of your heart.  When blood flow to your heart has peaked, a tracer will again be injected through the IV tube.  After 20-40 minutes, you will get back on the exam table and have more images taken of your heart.  When the procedure is  over, your IV tube will be removed. The procedure may vary among health care providers and hospitals. Depending on the type of tracer used, scans may need to be repeated 3-4 hours later. What happens after the procedure?  Unless your health care provider tells you otherwise, you may return to your normal schedule, including diet, activities, and medicines.  Unless your health care provider tells you otherwise, you may increase your fluid  intake. This will help flush the contrast dye from your body. Drink enough fluid to keep your urine clear or pale yellow.  It is up to you to get your test results. Ask your health care provider, or the department that is doing the test, when your results will be ready. Summary  A cardiac nuclear scan measures the blood flow to the heart when a person is resting and when he or she is exercising.  You may need this test if you are at risk for heart disease.  Tell your health care provider if you are pregnant.  Unless your health care provider tells you otherwise, increase your fluid intake. This will help flush the contrast dye from your body. Drink enough fluid to keep your urine clear or pale yellow. This information is not intended to replace advice given to you by your health care provider. Make sure you discuss any questions you have with your health care provider. Document Released: 08/18/2004 Document Revised: 07/26/2016 Document Reviewed: 07/02/2013 Elsevier Interactive Patient Education  2017 [employer].    If you need a refill on your cardiac medications before your next appointment, please call your pharmacy.

## 2017-11-30 ENCOUNTER — Ambulatory Visit: Payer: Medicare Other | Admitting: Oncology

## 2017-12-03 ENCOUNTER — Telehealth (HOSPITAL_COMMUNITY): Payer: Self-pay | Admitting: *Deleted

## 2017-12-03 NOTE — Telephone Encounter (Signed)
Attempted to call patient regarding upcoming appointment- no answer, unable to leave message.  Angel [name] Jacqueline  

## 2017-12-04 ENCOUNTER — Telehealth (HOSPITAL_COMMUNITY): Payer: Self-pay | Admitting: *Deleted

## 2017-12-04 NOTE — Telephone Encounter (Signed)
Patient given detailed instructions per Myocardial Perfusion Study Information Sheet for the test on 12/06/17 at 0730. Patient notified to arrive 15 minutes early and that it is imperative to arrive on time for appointment to keep from having the test rescheduled.  If you need to cancel or reschedule your appointment, please call the office within 24 hours of your appointment. . Patient verbalized understanding.Angel [name], Ranae Palms

## 2017-12-06 ENCOUNTER — Encounter (HOSPITAL_COMMUNITY): Payer: Medicare Other

## 2017-12-07 ENCOUNTER — Inpatient Hospital Stay: Payer: Medicare Other | Attending: Oncology | Admitting: Oncology

## 2017-12-07 ENCOUNTER — Encounter: Payer: Self-pay | Admitting: Oncology

## 2017-12-07 ENCOUNTER — Other Ambulatory Visit: Payer: Self-pay

## 2017-12-07 VITALS — BP 120/75 | HR 70 | Temp 96.6°F | Wt 141.5 lb

## 2017-12-07 DIAGNOSIS — E785 Hyperlipidemia, unspecified: Secondary | ICD-10-CM | POA: Diagnosis not present

## 2017-12-07 DIAGNOSIS — Z7982 Long term (current) use of aspirin: Secondary | ICD-10-CM | POA: Insufficient documentation

## 2017-12-07 DIAGNOSIS — R21 Rash and other nonspecific skin eruption: Secondary | ICD-10-CM | POA: Diagnosis not present

## 2017-12-07 DIAGNOSIS — K219 Gastro-esophageal reflux disease without esophagitis: Secondary | ICD-10-CM | POA: Diagnosis not present

## 2017-12-07 DIAGNOSIS — N186 End stage renal disease: Secondary | ICD-10-CM | POA: Insufficient documentation

## 2017-12-07 DIAGNOSIS — I132 Hypertensive heart and chronic kidney disease with heart failure and with stage 5 chronic kidney disease, or end stage renal disease: Secondary | ICD-10-CM | POA: Insufficient documentation

## 2017-12-07 DIAGNOSIS — R5383 Other fatigue: Secondary | ICD-10-CM | POA: Diagnosis not present

## 2017-12-07 DIAGNOSIS — R5381 Other malaise: Secondary | ICD-10-CM | POA: Insufficient documentation

## 2017-12-07 DIAGNOSIS — Z79899 Other long term (current) drug therapy: Secondary | ICD-10-CM | POA: Diagnosis not present

## 2017-12-07 DIAGNOSIS — Z992 Dependence on renal dialysis: Secondary | ICD-10-CM | POA: Insufficient documentation

## 2017-12-07 DIAGNOSIS — I5042 Chronic combined systolic (congestive) and diastolic (congestive) heart failure: Secondary | ICD-10-CM | POA: Insufficient documentation

## 2017-12-07 DIAGNOSIS — I714 Abdominal aortic aneurysm, without rupture: Secondary | ICD-10-CM | POA: Insufficient documentation

## 2017-12-07 DIAGNOSIS — D631 Anemia in chronic kidney disease: Secondary | ICD-10-CM | POA: Insufficient documentation

## 2017-12-07 DIAGNOSIS — Z8673 Personal history of transient ischemic attack (TIA), and cerebral infarction without residual deficits: Secondary | ICD-10-CM | POA: Diagnosis not present

## 2017-12-07 DIAGNOSIS — I252 Old myocardial infarction: Secondary | ICD-10-CM | POA: Insufficient documentation

## 2017-12-07 NOTE — Progress Notes (Signed)
Patient here today for follow up.  Patient states no new concerns today  

## 2017-12-07 NOTE — Progress Notes (Signed)
Hematology/Oncology Follow up note [facility] Telephone:([phone] [phone]) [phone]   Patient Care Team: Gwenlyn Saran, MD as PCP - General (Family Medicine)  REFERRING PROVIDER: Dr.Singh Sheilah Mins CHIEF COMPLAINTS/PURPOSE OF CONSULTATION:  Evaluation of anemia  HISTORY OF PRESENTING ILLNESS:  Angel [name] is a  47 y.o.  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  Patient reports that he has been getting Epogen with dialysis for about a year and a half.  Recently he has developed skin rash on buttock, also field questionable throat itching after he received Epogen injection.  Epogen was hold.  With further questioning, patient reports using new body wash during the same time period.  Patient has anemia of chronic kidney disease and was referred to [facility] for further evaluation. Patient reports feeling fatigue.  Denies any hematochezia or hematemesis, black tarry stool.  His rash improved after topical steroid cream  INTERVAL HISTORY Angel [name] is a 47 y.o. male who has above history reviewed by me today presents for follow up visit for management of anemia.  He has had lab work done at [place] on 11/23/17.  Hemoglobin 8.5, vitamin B12 1199, folate 12.8, ferritin 1878.  Iron 59, TIBC 240.  WBC 6, MCV 88.6, platelet counts 2 41,000.  TSH 2.97 Patient continued to feel fatigued.  Otherwise no new complaints.  Rash has resolved.  Review of Systems  Constitutional: Positive for malaise/fatigue. Negative for chills, fever and weight loss.  HENT: Negative for congestion, ear discharge, ear pain, nosebleeds, sinus pain and sore throat.   Eyes: Negative for double vision, photophobia, pain, discharge and redness.  Respiratory: Negative for cough, hemoptysis, sputum production, shortness of breath and wheezing.   Cardiovascular:  Negative for chest pain, palpitations, orthopnea, claudication and leg swelling.  Gastrointestinal: Negative for abdominal pain, blood in stool, constipation, diarrhea, heartburn, melena, nausea and vomiting.  Genitourinary: Negative for dysuria, flank pain, frequency and hematuria.  Musculoskeletal: Negative for back pain, myalgias and neck pain.  Skin: Negative for itching and rash.  Neurological: Negative for dizziness, tingling, tremors, sensory change, speech change, focal weakness, weakness and headaches.  Endo/Heme/Allergies: Negative for environmental allergies. Does not bruise/bleed easily.  Psychiatric/Behavioral: Negative for depression and hallucinations. The patient is not nervous/anxious.     MEDICAL HISTORY:  Past Medical History:  Diagnosis Date  . AAA (abdominal aortic aneurysm) ([place])   . Chronic anemia   . Chronic combined systolic and diastolic CHF (congestive heart failure) ([place])   . ESRD on dialysis ([place])   . FSGS (focal segmental glomerulosclerosis)   . GERD (gastroesophageal reflux disease)   . HLD (hyperlipidemia)   . Hypertension   . NSTEMI (non-ST elevated myocardial infarction) ([place]) 07/2017   pt refused cath  . Pericardial effusion    a. small by echo 06/2017.  Marland Kitchen Renal disorder   . Seizure ([place])   . Stroke (cerebrum) [facility])     SURGICAL HISTORY: Past Surgical History:  Procedure Laterality Date  . ABDOMINAL AORTIC ANEURYSM REPAIR    . KIDNEY SURGERY      SOCIAL HISTORY: Social History   Socioeconomic History  . Marital status: Single    Spouse name: Not on file  . Number of children: Not on file  . Years of education: Not on file  . Highest education level: Not on file  Occupational History  .  Not on file  Social Needs  . Financial resource strain: Not on file  . Food insecurity:    Worry: Not on file    Inability: Not on file  . Transportation needs:    Medical: Not on file    Non-medical: Not on file  Tobacco Use  . Smoking status:  Never Smoker  . Smokeless tobacco: Never Used  Substance and Sexual Activity  . Alcohol use: No    Frequency: Never  . Drug use: No  . Sexual activity: Not on file  Lifestyle  . Physical activity:    Days per week: Not on file    Minutes per session: Not on file  . Stress: Not on file  Relationships  . Social connections:    Talks on phone: Not on file    Gets together: Not on file    Attends religious service: Not on file    Active member of club or organization: Not on file    Attends meetings of clubs or organizations: Not on file    Relationship status: Not on file  . Intimate partner violence:    Fear of current or ex partner: Not on file    Emotionally abused: Not on file    Physically abused: Not on file    Forced sexual activity: Not on file  Other Topics Concern  . Not on file  Social History Narrative  . Not on file    FAMILY HISTORY: Family History  Problem Relation Age of Onset  . Hypertension Mother   . Kidney disease Mother   . Asthma Father   . Hypertension Father   . Kidney disease Father   . Hypertension Sister   . Heart disease Sister   . Kidney disease Sister   . Heart disease Brother   . Hypertension Brother   . COPD Brother     ALLERGIES:  is allergic to clonidine derivatives and hydralazine hcl.  MEDICATIONS:  Current Outpatient Medications  Medication Sig Dispense Refill  . aspirin EC 81 MG tablet Take 81 mg daily by mouth.    Marland Kitchen atorvastatin (LIPITOR) 40 MG tablet Take 40 mg at bedtime by mouth.    . carvedilol (COREG) 25 MG tablet Take 25 mg 2 (two) times daily with a meal by mouth.    . cholecalciferol (VITAMIN D) 1000 units tablet Take 1,000 Units by mouth 2 (two) times daily.    . cloNIDine (CATAPRES) 0.2 MG tablet Take 0.2 mg by mouth 2 (two) times daily.  23  . furosemide (LASIX) 80 MG tablet Take 80 mg at bedtime by mouth.    . hydrALAZINE (APRESOLINE) 100 MG tablet Take 100 mg by mouth 3 (three) times daily.    . Lactobacillus  Rhamnosus, GG, (CULTURELLE) CAPS Take 1 capsule by mouth daily.    Marland Kitchen losartan (COZAAR) 50 MG tablet Take 50 mg by mouth every evening.  5  . multivitamin (RENA-VIT) TABS tablet Take 1 tablet by mouth 2 (two) times daily.    . sevelamer carbonate (RENVELA) 800 MG tablet Take 1,600 mg by mouth See admin instructions. Take 2 tablets (1600 mg) three times daily with meals and with snacks     No current facility-administered medications for this visit.      PHYSICAL EXAMINATION: ECOG PERFORMANCE STATUS: 1 - Symptomatic but completely ambulatory Vitals:   12/07/17 1318  BP: 120/75  Pulse: 70  Temp: (!) 96.6 F (35.9 C)  SpO2: 95%   Filed Weights   12/07/17  1318  Weight: 141 lb 8 oz (64.2 kg)    Physical Exam  Constitutional: He is oriented to person, place, and time. He appears well-developed and well-nourished. No distress.  HENT:  Head: Normocephalic and atraumatic.  Right Ear: External ear normal.  Left Ear: External ear normal.  Mouth/Throat: Oropharynx is clear and moist.  Eyes: Pupils are equal, round, and reactive to light. Conjunctivae and EOM are normal. No scleral icterus.  Neck: Normal range of motion. Neck supple.  Cardiovascular: Normal rate, regular rhythm and normal heart sounds.  Pulmonary/Chest: Effort normal and breath sounds normal. No respiratory distress. He has no wheezes. He has no rales. He exhibits no tenderness.  Abdominal: Soft. Bowel sounds are normal. He exhibits no distension and no mass. There is no tenderness. There is no guarding.  Musculoskeletal: Normal range of motion. He exhibits no edema or deformity.  Lymphadenopathy:    He has no cervical adenopathy.  Neurological: He is alert and oriented to person, place, and time. He displays normal reflexes. No cranial nerve deficit. Coordination normal.  Skin: Skin is warm and dry. No rash noted. No erythema.  Psychiatric: He has a normal mood and affect. His behavior is normal. Thought content normal.       LABORATORY DATA:  I have reviewed the data as listed Lab Results  Component Value Date   WBC 7.6 08/04/2017   HGB 9.1 (L) 08/04/2017   HCT 28.5 (L) 08/04/2017   MCV 82.4 08/04/2017   PLT 235 08/04/2017   Recent Labs    07/23/17 0024  07/24/17 0733 07/30/17 0216 08/04/17 0951  NA 140   < > 138 139 141  K 5.0   < > 4.6 5.7* 4.8  CL 102   < > 100* 104 102  CO2 17*   < > 20* 17* 20*  GLUCOSE 102*   < > 102* 145* 119*  BUN 121*   < > 140* 132* 113*  CREATININE 18.57*   < > 19.87* 19.03* 18.15*  CALCIUM 9.0   < > 8.6* 9.1 9.9  GFRNONAA 3*   < > 2* 3* 3*  GFRAA 3*   < > 3* 3* 3*  PROT 6.4*  --   --  6.4* 7.1  ALBUMIN 3.1*  --  3.1* 3.0* 3.3*  AST 44*  --   --  39 38  ALT 60  --   --  49 56  ALKPHOS 152*  --   --  158* 132*  BILITOT 1.1  --   --  1.2 0.9   < > = values in this interval not displayed.       ASSESSMENT & PLAN:  1. End-stage renal disease needing dialysis ([place])   2. Anemia in ESRD (end-stage renal disease) ([place])   #Lab results were discussed with patient in detail.  Anemia is most likely secondary to ESRD.  He has adequate iron store, normal B12 and folate. Discussed with patient that an anemia is most likely secondary to ESRD.  Will check labs to rule out other etiologies.  Check CBC, iron, ferritin, B12, folate SPEP and free light chain ratio were ordered and not done. #Plan start Aranesp 100 MCG every 2 weeks with titration according to hemoglobin response. Side effects of erythropoietin treatment including but not limited to thrombosis events, heart attack, stroke, clots discussed with patient.  He voiced states understanding and willing to proceed.    All questions were answered. The patient knows to call the clinic  with any problems questions or concerns.  Return of visit: 2 weeks with CBC done at dialysis center prior to the visit. Thank you for this kind referral and the opportunity to participate in the care of this patient. A copy of today's  note is routed to referring provider    Earlie Server, MD, PhD Hematology Oncology [facility] at [facility] Pager- [phone] 12/07/2017

## 2017-12-11 ENCOUNTER — Inpatient Hospital Stay: Payer: Medicare Other

## 2017-12-13 ENCOUNTER — Inpatient Hospital Stay: Payer: Medicare Other

## 2017-12-13 VITALS — BP 154/88

## 2017-12-13 DIAGNOSIS — N186 End stage renal disease: Secondary | ICD-10-CM

## 2017-12-13 DIAGNOSIS — Z992 Dependence on renal dialysis: Principal | ICD-10-CM

## 2017-12-13 DIAGNOSIS — I132 Hypertensive heart and chronic kidney disease with heart failure and with stage 5 chronic kidney disease, or end stage renal disease: Secondary | ICD-10-CM | POA: Diagnosis not present

## 2017-12-13 MED ORDER — DARBEPOETIN ALFA 100 MCG/0.5ML IJ SOSY
100.0000 ug | PREFILLED_SYRINGE | Freq: Once | INTRAMUSCULAR | Status: AC
  Administered 2017-12-13: 100 ug via SUBCUTANEOUS
  Filled 2017-12-13: qty 0.5

## 2017-12-19 ENCOUNTER — Telehealth: Payer: Self-pay | Admitting: Physician Assistant

## 2017-12-19 NOTE — Telephone Encounter (Signed)
Called pt re: blood pressure readings. Pt's voicemail box hasn't been set up so couldn't leave a message. Will try again later.

## 2017-12-19 NOTE — Progress Notes (Signed)
Cardiology Office Note:    Date:  12/20/2017   ID:  Angel [name], DOB 04/07/1971, MRN [id number]  PCP:  Gwenlyn Saran, MD  Cardiologist:  Lauree Chandler, MD   Referring MD: Gwenlyn Saran, MD   Chief Complaint  Patient presents with  . Hypertension    History of Present Illness:    Angel [name] is a 47 y.o. male with a hx of ESRD on HD, chronic anemia, HTN, CVA, seizure, AAA s/p repair, NSTEMI/acute combined CHF 06/2017, and small pericardial effusion.  The patient has a history of noncompliance with HD, medications, and salt restriction. He was admitted 06/2017 with hypertensive urgency and dyspnea after missing HD. In this setting, he had elevated troponin and NSTEMI. Pt refused heart cath and left AMA, siting that he was about to be homeless. He since had missed HD sessions and was coming to the ED for HD. He was subsequently admitted 07/23/17 and on 07/30/17 for hypertensive urgency. Cardiology asked to evaluate for preoperative clearance for basilic vein transposition vs graft insertion. He subsequently canceled and no-showed for several appts.  He was seen in clinic by Melina Copa PAC on 11/29/17. At that time, he reportedly was doing much better. He was compliant with HD and on his medications, but admitted to not restricting his salt intake. His pressures were elevated. Case was discussed with Dr. Acie Fredrickson and planned for lexiscan myoview to evaluate ischemia following NSTEMI. Pt has rescheduled myoview - not yet completed.   He presents today for follow up. Received faxed copy of blood pressures from dialysis center. Pressures have been quite elevated. Per the patient, nephrology added 10 mg norvasc to his regimen last week. He reports last pressure pre-HD on Friday was 144/96. This is much better that [facility]or records.  His pressure here is 124/80. Will defer further medication adjustments to nephrology. He states he is compliant on all medications. He has rescheduled is  lexiscan. He denies chest pain, SOB, lower extremity swelling, orthopnea, bleeding problems, and recent falls or syncope. He denies headaches, visual disturbances, and slurred speech.   Patient is concerned about his son's health. 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    Past Medical History:  Diagnosis Date  . AAA (abdominal aortic aneurysm) ([place])   . Chronic anemia   . Chronic combined systolic and diastolic CHF (congestive heart failure) ([place])   . ESRD on dialysis ([place])   . FSGS (focal segmental glomerulosclerosis)   . GERD (gastroesophageal reflux disease)   . HLD (hyperlipidemia)   . Hypertension   . NSTEMI (non-ST elevated myocardial infarction) ([place]) 07/2017   pt refused cath  . Pericardial effusion    a. small by echo 06/2017.  Marland Kitchen Renal disorder   . Seizure ([place])   . Stroke (cerebrum) [facility])     Past Surgical History:  Procedure Laterality Date  . ABDOMINAL AORTIC ANEURYSM REPAIR    . KIDNEY SURGERY      Current Medications: Current Meds  Medication Sig  . amLODipine (NORVASC) 10 MG tablet Take 10 mg by mouth daily.  Marland Kitchen aspirin EC 81 MG tablet Take 81 mg daily by mouth.  Marland Kitchen atorvastatin (LIPITOR) 40 MG tablet Take 40 mg at bedtime by mouth.  . carvedilol (COREG) 25 MG tablet Take 25 mg 2 (two) times daily with a meal by mouth.  . cholecalciferol (VITAMIN D) 1000 units tablet Take 1,000 Units by mouth 2 (two) times daily.  . cloNIDine (  CATAPRES) 0.2 MG tablet Take 0.2 mg by mouth 2 (two) times daily.  . furosemide (LASIX) 80 MG tablet Take 80 mg at bedtime by mouth.  . hydrALAZINE (APRESOLINE) 100 MG tablet Take 100 mg by mouth 3 (three) times daily.  . Lactobacillus Rhamnosus, GG, (CULTURELLE) CAPS Take 1 capsule by mouth daily.  Marland Kitchen losartan (COZAAR) 50 MG tablet Take 50 mg by mouth every evening.  . multivitamin (RENA-VIT) TABS tablet Take 1 tablet by mouth 2 (two) times daily.  . sevelamer carbonate (RENVELA) 800 MG tablet Take  1,600 mg by mouth See admin instructions. Take 2 tablets (1600 mg) three times daily with meals and with snacks     Allergies:   Clonidine derivatives and Hydralazine hcl   Social History   Socioeconomic History  . Marital status: Single    Spouse name: Not on file  . Number of children: Not on file  . Years of education: Not on file  . Highest education level: Not on file  Occupational History  . Not on file  Social Needs  . Financial resource strain: Not on file  . Food insecurity:    Worry: Not on file    Inability: Not on file  . Transportation needs:    Medical: Not on file    Non-medical: Not on file  Tobacco Use  . Smoking status: Never Smoker  . Smokeless tobacco: Never Used  Substance and Sexual Activity  . Alcohol use: No    Frequency: Never  . Drug use: No  . Sexual activity: Not on file  Lifestyle  . Physical activity:    Days per week: Not on file    Minutes per session: Not on file  . Stress: Not on file  Relationships  . Social connections:    Talks on phone: Not on file    Gets together: Not on file    Attends religious service: Not on file    Active member of club or organization: Not on file    Attends meetings of clubs or organizations: Not on file    Relationship status: Not on file  Other Topics Concern  . Not on file  Social History Narrative  . Not on file     Family History: The patient's family history includes Asthma in his father; COPD in his brother; Heart disease in his brother and sister; Hypertension in his brother, father, mother, and sister; Kidney disease in his father, mother, and sister.  ROS:   Please see the history of present illness.    All other systems reviewed and are negative.  EKGs/Labs/Other Studies Reviewed:    The following studies were reviewed today:  Echo 06/2017 Study Conclusions - Left ventricle: The cavity size was normal. There was moderate   concentric hypertrophy. Systolic function was severely  reduced.   The estimated ejection fraction was in the range of 25% to 30%.   Severe diffuse hypokinesis with no identifiable regional   variations. Features are consistent with a pseudonormal left   ventricular filling pattern, with concomitant abnormal relaxation   and increased filling pressure (grade 2 diastolic dysfunction). - Aortic valve: There was trivial regurgitation. - Left atrium: The atrium was moderately dilated. - Tricuspid valve: There was moderate regurgitation. - Pericardium, extracardiac: A small pericardial effusion was   identified circumferential to the heart. There was no evidence of   hemodynamic compromise.    EKG:  EKG is not ordered today.    Recent Labs: 06/25/2017: TSH 2.543 08/04/2017:  ALT 56; BUN 113; Creatinine, Ser 18.15; Hemoglobin 9.1; Platelets 235; Potassium 4.8; Sodium 141  Recent Lipid Panel No results found for: CHOL, TRIG, HDL, CHOLHDL, VLDL, LDLCALC, LDLDIRECT  Physical Exam:    VS:  BP 124/80   Pulse 68   Ht 5\' 5"  (1.651 m)   Wt 134 lb 12.8 oz (61.1 kg)   BMI 22.43 kg/m     Wt Readings from Last 3 Encounters:  12/20/17 134 lb 12.8 oz (61.1 kg)  12/07/17 141 lb 8 oz (64.2 kg)  11/29/17 134 lb 12.8 oz (61.1 kg)     GEN: Well nourished, well developed in no acute distress HEENT: Normal NECK: No JVD; No carotid bruits LYMPHATICS: No lymphadenopathy CARDIAC: RRR, no murmurs, rubs, gallops, HD catheter in left chest RESPIRATORY:  Clear to auscultation without rales, wheezing or rhonchi  ABDOMEN: Soft, non-tender, non-distended MUSCULOSKELETAL:  Trace pitting edema B LE; No deformity  SKIN: Warm and dry NEUROLOGIC:  Alert and oriented x 3 PSYCHIATRIC:  Normal affect   ASSESSMENT:    1. Hypertension, unspecified type   2. Non-ST elevation (NSTEMI) myocardial infarction ([employer])   3. Chronic combined systolic and diastolic CHF (congestive heart failure) ([name])    PLAN:    In order of problems listed above:  Hypertension,  unspecified type -  Nephrology added 10 mg norvasc to his regimen last week. His pressure here is surprisingly well-controlled. Continue coreg, clonidine, hydralazine, and losartan. Will defer further medication adjustment for HTN to nephrology.   Hx of Non-ST elevation (NSTEMI) myocardial infarction [facility]) without chest pain in setting of uremia Will collect Basic metabolic panel, CBC, Hemoglobin A1c, Hepatic function panel, Lipid panel for aggressive secondary prevention. Myoview scheduled for June 2019. In encouraged him to keep this appt.    Chronic combined systolic and diastolic CHF (congestive heart failure) ([place]) He is near euvolemic on exam. He is compliant on 80 mg lasix daily. Will defer volume management to nephrology and HD. Check a BMP today for K.    He will follow up with Dr. Angelena Form in 6 months, or sooner if myoview is abnormal or if needed.    Medication Adjustments/Labs and Tests Ordered: Current medicines are reviewed at length with the patient today.  Concerns regarding medicines are outlined above.  Orders Placed This Encounter  Procedures  . Basic metabolic panel  . CBC  . Hemoglobin A1c  . Hepatic function panel  . Lipid panel   No orders of the defined types were placed in this encounter.   Signed, Ledora Bottcher, [place]  12/20/2017 11:37 AM    [facility]

## 2017-12-19 NOTE — Telephone Encounter (Signed)
Received copy of pt's BP readings from dialysis center, appear frequently elevated. Per prior d/w Dr. Acie Fredrickson (see OV), would recommend the patient discuss further adjustment of his HTN regimen with his nephrologist as the patient had reported some lower BP readings to [place] in clinic. In particular he should ask his kidney doctor if he can add amlodipine to his regimen. Compliance remains in question (I see he cancelled his nuc). Keep f/u as planned tomorrow to revisit BP. Dayna Dunn PA-C

## 2017-12-20 ENCOUNTER — Encounter: Payer: Self-pay | Admitting: Physician Assistant

## 2017-12-20 ENCOUNTER — Ambulatory Visit (INDEPENDENT_AMBULATORY_CARE_PROVIDER_SITE_OTHER): Payer: Medicare Other | Admitting: Physician Assistant

## 2017-12-20 VITALS — BP 124/80 | HR 68 | Ht 65.0 in | Wt 134.8 lb

## 2017-12-20 DIAGNOSIS — I214 Non-ST elevation (NSTEMI) myocardial infarction: Secondary | ICD-10-CM | POA: Diagnosis not present

## 2017-12-20 DIAGNOSIS — I5042 Chronic combined systolic (congestive) and diastolic (congestive) heart failure: Secondary | ICD-10-CM

## 2017-12-20 DIAGNOSIS — I10 Essential (primary) hypertension: Secondary | ICD-10-CM

## 2017-12-20 NOTE — Patient Instructions (Addendum)
Medication Instructions:   Your physician recommends that you continue on your current medications as directed. Please refer to the Current Medication list given to you today.    If you need a refill on your cardiac medications before your next appointment, please call your pharmacy.  Labwork:  RETURN FOR LABS  MORNING OF STRESS TEST  CBC  BMET HBA1C LIPIDS AND LFT   Testing/Procedures: NONE ORDERED  TODAY    Follow-Up: Your physician wants you to follow-up in:  IN  6  MONTHS WITH DR  Angelena Form ou will receive a reminder letter in the mail two months in advance. If you don't receive a letter, please call our office to schedule the follow-up appointment.      Any Other Special Instructions Will Be Listed Below (If Applicable).

## 2017-12-25 ENCOUNTER — Inpatient Hospital Stay: Payer: Medicare Other | Admitting: Oncology

## 2018-01-03 ENCOUNTER — Inpatient Hospital Stay: Payer: Medicare Other | Admitting: Oncology

## 2018-01-04 ENCOUNTER — Inpatient Hospital Stay: Payer: Medicare Other

## 2018-01-04 ENCOUNTER — Encounter: Payer: Self-pay | Admitting: Oncology

## 2018-01-04 ENCOUNTER — Inpatient Hospital Stay (HOSPITAL_BASED_OUTPATIENT_CLINIC_OR_DEPARTMENT_OTHER): Payer: Medicare Other | Admitting: Oncology

## 2018-01-04 VITALS — BP 143/87 | HR 62 | Temp 96.4°F | Resp 18 | Ht 65.0 in | Wt 134.5 lb

## 2018-01-04 DIAGNOSIS — I132 Hypertensive heart and chronic kidney disease with heart failure and with stage 5 chronic kidney disease, or end stage renal disease: Secondary | ICD-10-CM | POA: Diagnosis not present

## 2018-01-04 DIAGNOSIS — R319 Hematuria, unspecified: Secondary | ICD-10-CM

## 2018-01-04 DIAGNOSIS — Z992 Dependence on renal dialysis: Secondary | ICD-10-CM

## 2018-01-04 DIAGNOSIS — N186 End stage renal disease: Secondary | ICD-10-CM

## 2018-01-04 DIAGNOSIS — D631 Anemia in chronic kidney disease: Secondary | ICD-10-CM | POA: Diagnosis not present

## 2018-01-04 LAB — URINALYSIS, COMPLETE (UACMP) WITH MICROSCOPIC
BACTERIA UA: NONE SEEN
BILIRUBIN URINE: NEGATIVE
Glucose, UA: NEGATIVE mg/dL
Hgb urine dipstick: NEGATIVE
KETONES UR: NEGATIVE mg/dL
Leukocytes, UA: NEGATIVE
Nitrite: NEGATIVE
PH: 9 — AB (ref 5.0–8.0)
Protein, ur: 100 mg/dL — AB
SPECIFIC GRAVITY, URINE: 1.004 — AB (ref 1.005–1.030)

## 2018-01-04 MED ORDER — DARBEPOETIN ALFA 150 MCG/0.3ML IJ SOSY
150.0000 ug | PREFILLED_SYRINGE | Freq: Once | INTRAMUSCULAR | Status: AC
  Administered 2018-01-04: 150 ug via SUBCUTANEOUS
  Filled 2018-01-04: qty 0.3

## 2018-01-04 NOTE — Progress Notes (Signed)
Hemoglobin 8.9 drawn on 01/02/18 by outside labs.  MD approves to proceed with Aranesp with bp 143/87

## 2018-01-04 NOTE — Progress Notes (Signed)
Hematology/Oncology Follow up note [facility] [phone] [phone]   Patient Care Team: Gwenlyn Saran, MD as PCP - General (Family Medicine) Burnell Blanks, MD as PCP - Cardiology (Cardiology)  REFERRING PROVIDER: Dr.Singh Sheilah Mins REASON FOR VISIT Follow up for treatment of anemia  HISTORY OF PRESENTING ILLNESS:  Angel [name] is a  47 y.o.  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  Patient reports that he has been getting Epogen with dialysis for about a year and a half.  Recently he has developed skin rash on buttock, also field questionable throat itching after he received Epogen injection.  Epogen was hold.  With further questioning, patient reports using new body wash during the same time period.  Patient has anemia of chronic kidney disease and was referred to [facility] for further evaluation. Patient reports feeling fatigue.  Denies any hematochezia or hematemesis, black tarry stool.  His rash improved after topical steroid cream  INTERVAL HISTORY Angel [name] is a 47 y.o. male who has above history reviewed by me today presents for follow up visit for management of anemia. S/p Aranesp 192mcg,  Based on his lab from [place], hemoglobin increased from 8.5 to 8.9.  He denies any rash development.  He has had lab work done at [facility] on Patient continued to feel fatigued.  Otherwise no new complaints.  Rash has resolved.  Review of Systems  Constitutional: Positive for malaise/fatigue. Negative for chills, fever and weight loss.  HENT: Negative for congestion, ear discharge, ear pain, nosebleeds, sinus pain and sore throat.   Eyes: Negative for double vision, photophobia, pain, discharge and redness.  Respiratory: Negative for cough, hemoptysis, sputum production, shortness of breath and  wheezing.   Cardiovascular: Negative for chest pain, palpitations, orthopnea, claudication and leg swelling.  Gastrointestinal: Negative for abdominal pain, blood in stool, constipation, diarrhea, heartburn, melena, nausea and vomiting.  Genitourinary: Negative for dysuria, flank pain, frequency and hematuria.  Musculoskeletal: Negative for back pain, myalgias and neck pain.  Skin: Negative for itching and rash.  Neurological: Negative for dizziness, tingling, tremors, sensory change, speech change, focal weakness, weakness and headaches.  Endo/Heme/Allergies: Negative for environmental allergies. Does not bruise/bleed easily.  Psychiatric/Behavioral: Negative for depression and hallucinations. The patient is not nervous/anxious.     MEDICAL HISTORY:  Past Medical History:  Diagnosis Date  . AAA (abdominal aortic aneurysm) ([place])   . Chronic anemia   . Chronic combined systolic and diastolic CHF (congestive heart failure) ([place])   . ESRD on dialysis ([place])   . FSGS (focal segmental glomerulosclerosis)   . GERD (gastroesophageal reflux disease)   . HLD (hyperlipidemia)   . Hypertension   . NSTEMI (non-ST elevated myocardial infarction) ([place]) 07/2017   pt refused cath  . Pericardial effusion    a. small by echo 06/2017.  Marland Kitchen Renal disorder   . Seizure ([place])   . Stroke (cerebrum) [facility])     SURGICAL HISTORY: Past Surgical History:  Procedure Laterality Date  . ABDOMINAL AORTIC ANEURYSM REPAIR    . KIDNEY SURGERY      SOCIAL HISTORY: Social History   Socioeconomic History  . Marital status: Single    Spouse name: Not on file  . Number of children: Not on file  . Years of education: Not on file  . Highest education level: Not on file  Occupational History  . Not on file  Social Needs  . Financial resource strain: Not on file  . Food insecurity:    Worry: Not on file    Inability: Not on file  . Transportation needs:    Medical: Not on file    Non-medical: Not on file    Tobacco Use  . Smoking status: Never Smoker  . Smokeless tobacco: Never Used  Substance and Sexual Activity  . Alcohol use: No    Frequency: Never  . Drug use: No  . Sexual activity: Not on file  Lifestyle  . Physical activity:    Days per week: Not on file    Minutes per session: Not on file  . Stress: Not on file  Relationships  . Social connections:    Talks on phone: Not on file    Gets together: Not on file    Attends religious service: Not on file    Active member of club or organization: Not on file    Attends meetings of clubs or organizations: Not on file    Relationship status: Not on file  . Intimate partner violence:    Fear of current or ex partner: Not on file    Emotionally abused: Not on file    Physically abused: Not on file    Forced sexual activity: Not on file  Other Topics Concern  . Not on file  Social History Narrative  . Not on file    FAMILY HISTORY: Family History  Problem Relation Age of Onset  . Hypertension Mother   . Kidney disease Mother   . Asthma Father   . Hypertension Father   . Kidney disease Father   . Hypertension Sister   . Heart disease Sister   . Kidney disease Sister   . Heart disease Brother   . Hypertension Brother   . COPD Brother     ALLERGIES:  is allergic to clonidine derivatives and hydralazine hcl.  MEDICATIONS:  Current Outpatient Medications  Medication Sig Dispense Refill  . amLODipine (NORVASC) 10 MG tablet Take 10 mg by mouth daily.    Marland Kitchen aspirin EC 81 MG tablet Take 81 mg daily by mouth.    Marland Kitchen atorvastatin (LIPITOR) 40 MG tablet Take 40 mg at bedtime by mouth.    . carvedilol (COREG) 25 MG tablet Take 25 mg 2 (two) times daily with a meal by mouth.    . cholecalciferol (VITAMIN D) 1000 units tablet Take 1,000 Units by mouth 2 (two) times daily.    . cloNIDine (CATAPRES) 0.2 MG tablet Take 0.2 mg by mouth 2 (two) times daily.  23  . furosemide (LASIX) 80 MG tablet Take 80 mg at bedtime by mouth.    .  hydrALAZINE (APRESOLINE) 100 MG tablet Take 100 mg by mouth 3 (three) times daily.    . Lactobacillus Rhamnosus, GG, (CULTURELLE) CAPS Take 1 capsule by mouth daily.    Marland Kitchen losartan (COZAAR) 50 MG tablet Take 50 mg by mouth every evening.  5  . multivitamin (RENA-VIT) TABS tablet Take 1 tablet by mouth 2 (two) times daily.    . sevelamer carbonate (RENVELA) 800 MG tablet Take 1,600 mg by mouth See admin instructions. Take 2 tablets (1600 mg) three times daily with meals and with snacks     No current facility-administered medications for this visit.      PHYSICAL EXAMINATION: ECOG PERFORMANCE STATUS: 1 - Symptomatic but completely ambulatory Vitals:   01/04/18 0857  BP: Marland Kitchen)  143/87  Pulse: 62  Resp: 18  Temp: (!) 96.4 F (35.8 C)   Filed Weights   01/04/18 0857  Weight: 134 lb 8 oz (61 kg)    Physical Exam  Constitutional: He is oriented to person, place, and time. He appears well-developed and well-nourished. No distress.  HENT:  Head: Normocephalic and atraumatic.  Right Ear: External ear normal.  Left Ear: External ear normal.  Mouth/Throat: Oropharynx is clear and moist.  Eyes: Pupils are equal, round, and reactive to light. Conjunctivae and EOM are normal. No scleral icterus.  Neck: Normal range of motion. Neck supple.  Cardiovascular: Normal rate, regular rhythm and normal heart sounds.  Pulmonary/Chest: Effort normal and breath sounds normal. No respiratory distress. He has no wheezes. He has no rales. He exhibits no tenderness.  Abdominal: Soft. Bowel sounds are normal. He exhibits no distension and no mass. There is no tenderness. There is no guarding.  Musculoskeletal: Normal range of motion. He exhibits no edema or deformity.  Lymphadenopathy:    He has no cervical adenopathy.  Neurological: He is alert and oriented to person, place, and time. He displays normal reflexes. No cranial nerve deficit. Coordination normal.  Skin: Skin is warm and dry. No rash noted. No  erythema.  Psychiatric: He has a normal mood and affect. His behavior is normal. Thought content normal.     LABORATORY DATA:  I have reviewed the data as listed Lab Results  Component Value Date   WBC 7.6 08/04/2017   HGB 9.1 (L) 08/04/2017   HCT 28.5 (L) 08/04/2017   MCV 82.4 08/04/2017   PLT 235 08/04/2017   Recent Labs    07/23/17 0024  07/24/17 0733 07/30/17 0216 08/04/17 0951  NA 140   < > 138 139 141  K 5.0   < > 4.6 5.7* 4.8  CL 102   < > 100* 104 102  CO2 17*   < > 20* 17* 20*  GLUCOSE 102*   < > 102* 145* 119*  BUN 121*   < > 140* 132* 113*  CREATININE 18.57*   < > 19.87* 19.03* 18.15*  CALCIUM 9.0   < > 8.6* 9.1 9.9  GFRNONAA 3*   < > 2* 3* 3*  GFRAA 3*   < > 3* 3* 3*  PROT 6.4*  --   --  6.4* 7.1  ALBUMIN 3.1*  --  3.1* 3.0* 3.3*  AST 44*  --   --  39 38  ALT 60  --   --  49 56  ALKPHOS 152*  --   --  158* 132*  BILITOT 1.1  --   --  1.2 0.9   < > = values in this interval not displayed.   Labs from [facility] 11/23/17.  Hemoglobin 8.5, vitamin B12 1199, folate 12.8, ferritin 1878.  Iron 59, TIBC 240.  WBC 6, MCV 88.6, platelet counts 2 41,000.  TSH 2.97 01/03/18   Hemoglobin 8.9, iron saturation 73, iron 160, TIBC 219, wbc 5.5, platelet 232,000,    ASSESSMENT & PLAN:  1. Anemia in ESRD (end-stage renal disease) ([place])   2. End-stage renal disease needing dialysis ([place])   3. Hematuria, unspecified type   # s/p Aranesp 128mcg about 2-3 weeks ago, hemoglobin improved from 8.5 to 8.9 Will increase Aranesp to 170mcg today.   All questions were answered. The patient knows to call the clinic with any problems questions or concerns.  Return of visit: 2 weeks with CBC done at  dialysis center prior to the visit. Thank you for this kind referral and the opportunity to participate in the care of this patient. A copy of today's note is routed to referring provider    Earlie Server, MD, PhD Hematology Oncology [facility] at [facility] Pager-  [phone] 01/04/2018

## 2018-01-04 NOTE — Progress Notes (Signed)
Pt doing ok with aranesp inj. He was getting epogen at dialysis and it was breaking him out with rash. The shots here he is doing well no side effects. He does see blood at time in urine and I reported that to md.

## 2018-01-14 ENCOUNTER — Telehealth (HOSPITAL_COMMUNITY): Payer: Self-pay | Admitting: *Deleted

## 2018-01-14 NOTE — Telephone Encounter (Signed)
Patient given detailed instructions per Myocardial Perfusion Study Information Sheet for the test on 01/16/18 at 0730. Patient notified to arrive 15 minutes early and that it is imperative to arrive on time for appointment to keep from having the test rescheduled.  If you need to cancel or reschedule your appointment, please call the office within 24 hours of your appointment. . Patient verbalized understanding.Inari Shin, [place]

## 2018-01-14 NOTE — Telephone Encounter (Signed)
Attempted to call patient regarding upcoming appointment- no answer.  Angel [name]  

## 2018-01-15 ENCOUNTER — Telehealth (HOSPITAL_COMMUNITY): Payer: Self-pay | Admitting: Physician Assistant

## 2018-01-16 ENCOUNTER — Encounter (HOSPITAL_COMMUNITY): Payer: Medicare Other

## 2018-01-16 ENCOUNTER — Other Ambulatory Visit: Payer: Medicare Other

## 2018-01-16 NOTE — Telephone Encounter (Signed)
User: Cherie Dark A Date/time: 01/15/18 1:57 PM  Comment: Called pt to r/s myoview appt but he did not have a VM set up..  Context:  Outcome: No Answer/Busy  Phone number: [phone] Phone Type: Home Phone  Comm. type: Telephone Call type: Outgoing  Contact: [name], Angel Rashaad Relation to patient: Self

## 2018-01-17 ENCOUNTER — Telehealth (HOSPITAL_COMMUNITY): Payer: Self-pay | Admitting: *Deleted

## 2018-01-17 NOTE — Telephone Encounter (Signed)
Attempted to call patient regarding upcoming appointment- no answer.  Angel [name] Jacqueline  

## 2018-01-18 ENCOUNTER — Ambulatory Visit: Payer: Medicare Other

## 2018-01-18 ENCOUNTER — Other Ambulatory Visit: Payer: Self-pay

## 2018-01-18 ENCOUNTER — Encounter: Payer: Self-pay | Admitting: Oncology

## 2018-01-18 ENCOUNTER — Inpatient Hospital Stay: Payer: Medicare Other | Attending: Oncology | Admitting: Oncology

## 2018-01-18 ENCOUNTER — Ambulatory Visit: Payer: Medicare Other | Admitting: Oncology

## 2018-01-18 ENCOUNTER — Inpatient Hospital Stay: Payer: Medicare Other

## 2018-01-18 VITALS — BP 161/95 | HR 98 | Temp 97.8°F | Wt 138.4 lb

## 2018-01-18 DIAGNOSIS — I132 Hypertensive heart and chronic kidney disease with heart failure and with stage 5 chronic kidney disease, or end stage renal disease: Secondary | ICD-10-CM | POA: Insufficient documentation

## 2018-01-18 DIAGNOSIS — I5042 Chronic combined systolic (congestive) and diastolic (congestive) heart failure: Secondary | ICD-10-CM | POA: Diagnosis not present

## 2018-01-18 DIAGNOSIS — D631 Anemia in chronic kidney disease: Secondary | ICD-10-CM | POA: Insufficient documentation

## 2018-01-18 DIAGNOSIS — E785 Hyperlipidemia, unspecified: Secondary | ICD-10-CM | POA: Insufficient documentation

## 2018-01-18 DIAGNOSIS — R5383 Other fatigue: Secondary | ICD-10-CM | POA: Insufficient documentation

## 2018-01-18 DIAGNOSIS — Z992 Dependence on renal dialysis: Secondary | ICD-10-CM | POA: Diagnosis not present

## 2018-01-18 DIAGNOSIS — R5381 Other malaise: Secondary | ICD-10-CM | POA: Insufficient documentation

## 2018-01-18 DIAGNOSIS — Z7982 Long term (current) use of aspirin: Secondary | ICD-10-CM | POA: Insufficient documentation

## 2018-01-18 DIAGNOSIS — N186 End stage renal disease: Secondary | ICD-10-CM

## 2018-01-18 DIAGNOSIS — Z79899 Other long term (current) drug therapy: Secondary | ICD-10-CM | POA: Diagnosis not present

## 2018-01-18 DIAGNOSIS — Z8673 Personal history of transient ischemic attack (TIA), and cerebral infarction without residual deficits: Secondary | ICD-10-CM | POA: Diagnosis not present

## 2018-01-18 DIAGNOSIS — I252 Old myocardial infarction: Secondary | ICD-10-CM | POA: Diagnosis not present

## 2018-01-18 DIAGNOSIS — I714 Abdominal aortic aneurysm, without rupture: Secondary | ICD-10-CM | POA: Insufficient documentation

## 2018-01-18 DIAGNOSIS — R21 Rash and other nonspecific skin eruption: Secondary | ICD-10-CM | POA: Diagnosis not present

## 2018-01-18 DIAGNOSIS — K219 Gastro-esophageal reflux disease without esophagitis: Secondary | ICD-10-CM | POA: Insufficient documentation

## 2018-01-18 MED ORDER — DARBEPOETIN ALFA 200 MCG/0.4ML IJ SOSY
200.0000 ug | PREFILLED_SYRINGE | Freq: Once | INTRAMUSCULAR | Status: AC
  Administered 2018-01-18: 200 ug via SUBCUTANEOUS
  Filled 2018-01-18 (×2): qty 0.4

## 2018-01-18 NOTE — Progress Notes (Signed)
Patient has lab results drawn on 01/14/18 at [facility] with hemoglobin of 8.8.  BP is elevated today (161/95) and Dr. Tasia Catchings approves to proceed with Aranesp today.

## 2018-01-18 NOTE — Progress Notes (Signed)
Patient here today for follow up. No concerns voiced. BP elevated and he states that he just took his BP medications a few minutes ago.

## 2018-01-18 NOTE — Progress Notes (Signed)
Hematology/Oncology Follow up note [facility] [phone] [phone]   Patient Care Team: Gwenlyn Saran, MD as PCP - General (Family Medicine) Burnell Blanks, MD as PCP - Cardiology (Cardiology)  REFERRING PROVIDER: Dr.Singh Sheilah Mins REASON FOR VISIT Follow up for treatment of anemia  HISTORY OF PRESENTING ILLNESS:  Angel [name] is a  47 y.o.  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  Patient reports that he has been getting Epogen with dialysis for about a year and a half.  Recently he has developed skin rash on buttock, also field questionable throat itching after he received Epogen injection.  Epogen was hold.  With further questioning, patient reports using new body wash during the same time period.  Patient has anemia of chronic kidney disease and was referred to [facility] for further evaluation. Patient reports feeling fatigue.  Denies any hematochezia or hematemesis, black tarry stool.  His rash improved after topical steroid cream  INTERVAL HISTORY Angel [name] is a 47 y.o. male who has above history reviewed by me today presents for follow up visit for management of anemia secondary to chronic kidney disease.  Currently he is on Aranesp 150 MCG every 2 weeks. Because he does not have good IV access and very difficult to obtain labs, he has been getting labs at dialysis center. Reviewed labs that was sent from dialysis center.  On January 14, 2018, his hemoglobin is 8.9, stable at his baseline. Fatigue: Stable chronic onset, perisistent, no aggravating or improving factors, no associated symptoms.  Denies any cough tenderness, chest pain, focal weakness. He denies any rash development.    Review of Systems  Constitutional: Positive for malaise/fatigue. Negative for chills, fever and weight loss.    HENT: Negative for congestion, ear discharge, ear pain, nosebleeds, sinus pain and sore throat.   Eyes: Negative for double vision, photophobia, pain, discharge and redness.  Respiratory: Negative for cough, hemoptysis, sputum production, shortness of breath and wheezing.   Cardiovascular: Negative for chest pain, palpitations, orthopnea, claudication and leg swelling.  Gastrointestinal: Negative for abdominal pain, blood in stool, constipation, diarrhea, heartburn, melena, nausea and vomiting.  Genitourinary: Negative for dysuria, flank pain, frequency and hematuria.  Musculoskeletal: Negative for back pain, myalgias and neck pain.  Skin: Negative for itching and rash.  Neurological: Negative for dizziness, tingling, tremors, sensory change, speech change, focal weakness, weakness and headaches.  Endo/Heme/Allergies: Negative for environmental allergies. Does not bruise/bleed easily.  Psychiatric/Behavioral: Negative for depression and hallucinations. The patient is not nervous/anxious.     MEDICAL HISTORY:  Past Medical History:  Diagnosis Date  . AAA (abdominal aortic aneurysm) ([place])   . Chronic anemia   . Chronic combined systolic and diastolic CHF (congestive heart failure) ([place])   . End stage renal disease ([place])   . ESRD on dialysis ([place])   . FSGS (focal segmental glomerulosclerosis)   . GERD (gastroesophageal reflux disease)   . HLD (hyperlipidemia)   . Hypertension   . NSTEMI (non-ST elevated myocardial infarction) ([place]) 07/2017   pt refused cath  . Pericardial effusion    a. small by echo 06/2017.  Marland Kitchen Renal disorder   . Seizure ([place])   . Stroke (cerebrum) [facility])     SURGICAL HISTORY: Past Surgical History:  Procedure Laterality Date  . ABDOMINAL AORTIC ANEURYSM REPAIR    . KIDNEY SURGERY  SOCIAL HISTORY: Social History   Socioeconomic History  . Marital status: Single    Spouse name: Not on file  . Number of children: Not on file  . Years of education: Not  on file  . Highest education level: Not on file  Occupational History  . Not on file  Social Needs  . Financial resource strain: Not on file  . Food insecurity:    Worry: Not on file    Inability: Not on file  . Transportation needs:    Medical: Not on file    Non-medical: Not on file  Tobacco Use  . Smoking status: Never Smoker  . Smokeless tobacco: Never Used  Substance and Sexual Activity  . Alcohol use: No    Frequency: Never  . Drug use: No  . Sexual activity: Not on file  Lifestyle  . Physical activity:    Days per week: Not on file    Minutes per session: Not on file  . Stress: Not on file  Relationships  . Social connections:    Talks on phone: Not on file    Gets together: Not on file    Attends religious service: Not on file    Active member of club or organization: Not on file    Attends meetings of clubs or organizations: Not on file    Relationship status: Not on file  . Intimate partner violence:    Fear of current or ex partner: Not on file    Emotionally abused: Not on file    Physically abused: Not on file    Forced sexual activity: Not on file  Other Topics Concern  . Not on file  Social History Narrative  . Not on file    FAMILY HISTORY: Family History  Problem Relation Age of Onset  . Hypertension Mother   . Kidney disease Mother   . Asthma Father   . Hypertension Father   . Kidney disease Father   . Hypertension Sister   . Heart disease Sister   . Kidney disease Sister   . Heart disease Brother   . Hypertension Brother   . COPD Brother     ALLERGIES:  is allergic to clonidine derivatives and hydralazine hcl.  MEDICATIONS:  Current Outpatient Medications  Medication Sig Dispense Refill  . amLODipine (NORVASC) 10 MG tablet Take 10 mg by mouth daily.    Marland Kitchen aspirin EC 81 MG tablet Take 81 mg daily by mouth.    Marland Kitchen atorvastatin (LIPITOR) 40 MG tablet Take 40 mg at bedtime by mouth.    . carvedilol (COREG) 25 MG tablet Take 25 mg 2 (two)  times daily with a meal by mouth.    . cholecalciferol (VITAMIN D) 1000 units tablet Take 1,000 Units by mouth 2 (two) times daily.    . cloNIDine (CATAPRES) 0.2 MG tablet Take 0.2 mg by mouth 2 (two) times daily.  23  . furosemide (LASIX) 80 MG tablet Take 80 mg at bedtime by mouth.    . hydrALAZINE (APRESOLINE) 100 MG tablet Take 100 mg by mouth 3 (three) times daily.    . Lactobacillus Rhamnosus, GG, (CULTURELLE) CAPS Take 1 capsule by mouth daily.    Marland Kitchen losartan (COZAAR) 50 MG tablet Take 50 mg by mouth every evening.  5  . multivitamin (RENA-VIT) TABS tablet Take 1 tablet by mouth 2 (two) times daily.    . sevelamer carbonate (RENVELA) 800 MG tablet Take 1,600 mg by mouth See admin instructions. Take 2 tablets (  1600 mg) three times daily with meals and with snacks     No current facility-administered medications for this visit.      PHYSICAL EXAMINATION: ECOG PERFORMANCE STATUS: 1 - Symptomatic but completely ambulatory Vitals:   01/18/18 0900  BP: (!) 161/95  Pulse: 98  Temp: 97.8 F (36.6 C)   Filed Weights   01/18/18 0900  Weight: 138 lb 6.4 oz (62.8 kg)    Physical Exam  Constitutional: He is oriented to person, place, and time. He appears well-developed and well-nourished. No distress.  HENT:  Head: Normocephalic and atraumatic.  Right Ear: External ear normal.  Left Ear: External ear normal.  Mouth/Throat: Oropharynx is clear and moist.  Eyes: Pupils are equal, round, and reactive to light. EOM are normal. No scleral icterus.  Neck: Normal range of motion. Neck supple.  Cardiovascular: Normal rate, regular rhythm and normal heart sounds.  Pulmonary/Chest: Effort normal and breath sounds normal. No respiratory distress. He has no wheezes. He has no rales. He exhibits no tenderness.  Abdominal: Soft. Bowel sounds are normal. He exhibits no distension and no mass. There is no tenderness. There is no guarding.  Musculoskeletal: Normal range of motion. He exhibits no  edema or deformity.  Lymphadenopathy:    He has no cervical adenopathy.  Neurological: He is alert and oriented to person, place, and time. He displays normal reflexes. No cranial nerve deficit. Coordination normal.  Skin: Skin is warm and dry. No rash noted. No erythema.  Psychiatric: He has a normal mood and affect. His behavior is normal. Thought content normal.     LABORATORY DATA:  I have reviewed the data as listed Lab Results  Component Value Date   WBC 7.6 08/04/2017   HGB 9.1 (L) 08/04/2017   HCT 28.5 (L) 08/04/2017   MCV 82.4 08/04/2017   PLT 235 08/04/2017   Recent Labs    07/23/17 0024  07/24/17 0733 07/30/17 0216 08/04/17 0951  NA 140   < > 138 139 141  K 5.0   < > 4.6 5.7* 4.8  CL 102   < > 100* 104 102  CO2 17*   < > 20* 17* 20*  GLUCOSE 102*   < > 102* 145* 119*  BUN 121*   < > 140* 132* 113*  CREATININE 18.57*   < > 19.87* 19.03* 18.15*  CALCIUM 9.0   < > 8.6* 9.1 9.9  GFRNONAA 3*   < > 2* 3* 3*  GFRAA 3*   < > 3* 3* 3*  PROT 6.4*  --   --  6.4* 7.1  ALBUMIN 3.1*  --  3.1* 3.0* 3.3*  AST 44*  --   --  39 38  ALT 60  --   --  49 56  ALKPHOS 152*  --   --  158* 132*  BILITOT 1.1  --   --  1.2 0.9   < > = values in this interval not displayed.   Labs from [place] reviewed by me. 11/23/17.  Hemoglobin 8.5, vitamin B12 1199, folate 12.8, ferritin 1878.  Iron 59, TIBC 240.  WBC 6, MCV 88.6, platelet counts 2 41,000.  TSH 2.97 01/03/18   Hemoglobin 8.9, iron saturation 73, iron 160, TIBC 219, wbc 5.5, platelet 232,000,  01/07/2018 Hemoglobin 9.1 ferritin 1924, Iron saturation 38, TIBC 221 01/14/18  Hemoglobin 8.8, Hct 26.4.  ASSESSMENT & PLAN:  1. ESRD (end stage renal disease) on dialysis ([place])   2. Anemia in ESRD (end-stage renal  disease) ([place])   #Hemoglobin stable, less than 10. We will increase Aranesp to 200 MCG every 2 weeks.  Proceed today's dose.  repeat CBC in 2 weeks at [place] prior to next Aranesp treatment. he can follow-up with me in 4 weeks  with repeat CBC at [place] prior to Aranesp treatment.  All questions were answered. The patient knows to call the clinic with any problems questions or concerns.  Return of visit: 2 weeks with CBC done at dialysis center prior Aranesp.. Lab MD Aranesp in 4 weeks.. Thank you for this kind referral and the opportunity to participate in the care of this patient. A copy of today's note is routed to referring provider    Earlie Server, MD, PhD Hematology Oncology [facility] at [facility] Pager- [phone] 01/18/2018

## 2018-01-21 ENCOUNTER — Telehealth (HOSPITAL_COMMUNITY): Payer: Self-pay | Admitting: *Deleted

## 2018-01-21 NOTE — Telephone Encounter (Signed)
Patient given detailed instructions per Myocardial Perfusion Study Information Sheet for the test on 01/22/18 at 0730. Patient notified to arrive 15 minutes early and that it is imperative to arrive on time for appointment to keep from having the test rescheduled.  If you need to cancel or reschedule your appointment, please call the office within 24 hours of your appointment. . Patient verbalized understanding.[name], Ranae [place]

## 2018-01-22 ENCOUNTER — Encounter (HOSPITAL_COMMUNITY): Payer: Self-pay

## 2018-01-22 ENCOUNTER — Other Ambulatory Visit: Payer: Self-pay

## 2018-02-01 ENCOUNTER — Inpatient Hospital Stay: Payer: Medicare Other

## 2018-02-05 ENCOUNTER — Telehealth (HOSPITAL_COMMUNITY): Payer: Self-pay | Admitting: *Deleted

## 2018-02-05 NOTE — Telephone Encounter (Signed)
Attempted to leave a message on voicemail in reference to upcoming appointment scheduled for 02/11/18 but vmail not set up. Phone number given for a call back so details instructions can be given. Indiya Izquierdo, [place]e [place]

## 2018-02-06 ENCOUNTER — Telehealth (HOSPITAL_COMMUNITY): Payer: Self-pay | Admitting: *Deleted

## 2018-02-06 NOTE — Telephone Encounter (Signed)
Attempted to call patient regarding upcoming appointment- no answer, unable to leave message.  Kirstie Peri

## 2018-02-11 ENCOUNTER — Ambulatory Visit (HOSPITAL_COMMUNITY): Payer: Medicare Other

## 2018-02-11 ENCOUNTER — Telehealth: Payer: Self-pay | Admitting: Cardiovascular Disease

## 2018-02-11 NOTE — Telephone Encounter (Signed)
Pt no showed for 4th time for stress test. He will not be rescheduled.   Lauree Chandler

## 2018-02-15 ENCOUNTER — Inpatient Hospital Stay: Payer: Medicare Other

## 2018-02-15 ENCOUNTER — Inpatient Hospital Stay: Payer: Medicare Other | Attending: Oncology | Admitting: Oncology

## 2018-03-06 ENCOUNTER — Other Ambulatory Visit: Payer: Self-pay

## 2018-03-06 ENCOUNTER — Inpatient Hospital Stay: Payer: Medicare Other | Attending: Oncology | Admitting: Oncology

## 2018-03-06 ENCOUNTER — Encounter: Payer: Self-pay | Admitting: Oncology

## 2018-03-06 ENCOUNTER — Inpatient Hospital Stay: Payer: Medicare Other | Attending: Oncology

## 2018-03-06 VITALS — BP 183/111 | HR 81 | Temp 97.5°F | Resp 18 | Wt 133.1 lb

## 2018-03-06 DIAGNOSIS — I5042 Chronic combined systolic (congestive) and diastolic (congestive) heart failure: Secondary | ICD-10-CM | POA: Diagnosis not present

## 2018-03-06 DIAGNOSIS — K219 Gastro-esophageal reflux disease without esophagitis: Secondary | ICD-10-CM | POA: Diagnosis not present

## 2018-03-06 DIAGNOSIS — R5383 Other fatigue: Secondary | ICD-10-CM | POA: Diagnosis not present

## 2018-03-06 DIAGNOSIS — D631 Anemia in chronic kidney disease: Secondary | ICD-10-CM

## 2018-03-06 DIAGNOSIS — I132 Hypertensive heart and chronic kidney disease with heart failure and with stage 5 chronic kidney disease, or end stage renal disease: Secondary | ICD-10-CM | POA: Diagnosis not present

## 2018-03-06 DIAGNOSIS — Z8673 Personal history of transient ischemic attack (TIA), and cerebral infarction without residual deficits: Secondary | ICD-10-CM | POA: Insufficient documentation

## 2018-03-06 DIAGNOSIS — Z79899 Other long term (current) drug therapy: Secondary | ICD-10-CM | POA: Insufficient documentation

## 2018-03-06 DIAGNOSIS — Z7982 Long term (current) use of aspirin: Secondary | ICD-10-CM | POA: Insufficient documentation

## 2018-03-06 DIAGNOSIS — N186 End stage renal disease: Secondary | ICD-10-CM | POA: Diagnosis not present

## 2018-03-06 DIAGNOSIS — I10 Essential (primary) hypertension: Secondary | ICD-10-CM

## 2018-03-06 DIAGNOSIS — Z992 Dependence on renal dialysis: Secondary | ICD-10-CM | POA: Diagnosis not present

## 2018-03-06 DIAGNOSIS — R5381 Other malaise: Secondary | ICD-10-CM | POA: Diagnosis not present

## 2018-03-06 DIAGNOSIS — R21 Rash and other nonspecific skin eruption: Secondary | ICD-10-CM

## 2018-03-06 DIAGNOSIS — I252 Old myocardial infarction: Secondary | ICD-10-CM | POA: Diagnosis not present

## 2018-03-06 DIAGNOSIS — E785 Hyperlipidemia, unspecified: Secondary | ICD-10-CM | POA: Diagnosis not present

## 2018-03-06 NOTE — Progress Notes (Signed)
Hematology/Oncology Follow up note [facility] [phone] [phone]   Patient Care Team: Gwenlyn Saran, MD as PCP - General (Family Medicine) Burnell Blanks, MD as PCP - Cardiology (Cardiology)  REFERRING PROVIDER: Dr.Singh Sheilah Mins REASON FOR VISIT Follow up for treatment of anemia  HISTORY OF PRESENTING ILLNESS:  Angel [name] is a  47 y.o.  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  Patient reports that he has been getting Epogen with dialysis for about a year and a half.  Recently he has developed skin rash on buttock, also field questionable throat itching after he received Epogen injection.  Epogen was hold.  With further questioning, patient reports using new body Angel during the same time period.  Patient has anemia of chronic kidney disease and was referred to [facility] for further evaluation. Patient reports feeling fatigue.  Denies any hematochezia or hematemesis, black tarry stool.  His rash improved after topical steroid cream  INTERVAL HISTORY Angel [name] is a 47 y.o. male who as above history reviewed by me today presents for follow-up visit for management of anemia secondary to chronic kidney disease.  Patient is on hemodialysis.  Currently he is on iron labs 200 MCG every 2 weeks.  Because he has no good IV access and it very difficult to obtain labs.  He has been getting his labs at dialysis center.  I reviewed his blood work that was done at the dialysis center on 03/06/2018.  Hemoglobin 7.1, hematocrit 22.4, iron saturation 55%, iron 1 tab, TIBC 200.  Denies any melena, blood in the stool hemoptysis, bleeding events.  Fatigue is chronic at baseline. His blood pressure is high during this visit.  Blood pressure 183/110.  Pulse 81. Patient reports that he did not take hydralazine and  clonidine this morning because he has to pick up his medicine at the pharmacy.  Denies any dizziness headache or lightheadedness focal weakness.   Review of Systems  Constitutional: Positive for malaise/fatigue. Negative for chills, fever and weight loss.  HENT: Negative for congestion, ear discharge, ear pain, nosebleeds, sinus pain and sore throat.   Eyes: Negative for double vision, photophobia, pain, discharge and redness.  Respiratory: Negative for cough, hemoptysis, sputum production, shortness of breath and wheezing.   Cardiovascular: Negative for chest pain, palpitations, orthopnea, claudication and leg swelling.  Gastrointestinal: Negative for abdominal pain, blood in stool, constipation, diarrhea, heartburn, melena, nausea and vomiting.  Genitourinary: Negative for dysuria, flank pain, frequency and hematuria.  Musculoskeletal: Negative for back pain, myalgias and neck pain.  Skin: Negative for itching and rash.  Neurological: Negative for dizziness, tingling, tremors, sensory change, speech change, focal weakness, weakness and headaches.  Endo/Heme/Allergies: Negative for environmental allergies. Does not bruise/bleed easily.  Psychiatric/Behavioral: Negative for depression and hallucinations. The patient is not nervous/anxious.     MEDICAL HISTORY:  Past Medical History:  Diagnosis Date  . AAA (abdominal aortic aneurysm) ([place])   . Chronic anemia   . Chronic combined systolic and diastolic CHF (congestive heart failure) ([place])   . End stage renal disease ([place])   . ESRD on dialysis ([place])   . FSGS (focal segmental glomerulosclerosis)   . GERD (gastroesophageal reflux disease)   . HLD (hyperlipidemia)   . Hypertension   . NSTEMI (non-ST elevated myocardial infarction) ([place]) 07/2017   pt refused cath  . Pericardial  effusion    a. small by echo 06/2017.  Marland Kitchen Renal disorder   . Seizure ([place])   . Stroke (cerebrum) [facility])     SURGICAL HISTORY: Past Surgical History:  Procedure  Laterality Date  . ABDOMINAL AORTIC ANEURYSM REPAIR    . KIDNEY SURGERY      SOCIAL HISTORY: Social History   Socioeconomic History  . Marital status: Single    Spouse name: Not on file  . Number of children: Not on file  . Years of education: Not on file  . Highest education level: Not on file  Occupational History  . Not on file  Social Needs  . Financial resource strain: Not on file  . Food insecurity:    Worry: Not on file    Inability: Not on file  . Transportation needs:    Medical: Not on file    Non-medical: Not on file  Tobacco Use  . Smoking status: Never Smoker  . Smokeless tobacco: Never Used  Substance and Sexual Activity  . Alcohol use: No    Frequency: Never  . Drug use: No  . Sexual activity: Not on file  Lifestyle  . Physical activity:    Days per week: Not on file    Minutes per session: Not on file  . Stress: Not on file  Relationships  . Social connections:    Talks on phone: Not on file    Gets together: Not on file    Attends religious service: Not on file    Active member of club or organization: Not on file    Attends meetings of clubs or organizations: Not on file    Relationship status: Not on file  . Intimate partner violence:    Fear of current or ex partner: Not on file    Emotionally abused: Not on file    Physically abused: Not on file    Forced sexual activity: Not on file  Other Topics Concern  . Not on file  Social History Narrative  . Not on file    FAMILY HISTORY: Family History  Problem Relation Age of Onset  . Hypertension Mother   . Kidney disease Mother   . Asthma Father   . Hypertension Father   . Kidney disease Father   . Hypertension Sister   . Heart disease Sister   . Kidney disease Sister   . Heart disease Brother   . Hypertension Brother   . COPD Brother     ALLERGIES:  is allergic to clonidine derivatives and hydralazine hcl.  MEDICATIONS:  Current Outpatient Medications  Medication Sig Dispense  Refill  . amLODipine (NORVASC) 10 MG tablet Take 10 mg by mouth daily.    Marland Kitchen aspirin EC 81 MG tablet Take 81 mg daily by mouth.    Marland Kitchen atorvastatin (LIPITOR) 40 MG tablet Take 40 mg at bedtime by mouth.    . calcitRIOL (ROCALTROL) 0.5 MCG capsule Take by mouth. Take 1 capsule by mouth every day    . carvedilol (COREG) 25 MG tablet Take 25 mg 2 (two) times daily with a meal by mouth.    . cholecalciferol (VITAMIN D) 1000 units tablet Take 1,000 Units by mouth 2 (two) times daily.    . cloNIDine (CATAPRES) 0.2 MG tablet Take 0.2 mg by mouth 2 (two) times daily.  23  . furosemide (LASIX) 80 MG tablet Take 80 mg at bedtime by mouth.    . hydrALAZINE (APRESOLINE) 100 MG tablet Take 100 mg by mouth  3 (three) times daily.    . hydrOXYzine (ATARAX/VISTARIL) 10 MG tablet Take by mouth. Take 1 tablet daily as needed    . Lactobacillus Rhamnosus, GG, (CULTURELLE) CAPS Take 1 capsule by mouth daily.    Marland Kitchen losartan (COZAAR) 50 MG tablet Take 50 mg by mouth every evening.  5  . multivitamin (RENA-VIT) TABS tablet Take 1 tablet by mouth 2 (two) times daily.    . sevelamer carbonate (RENVELA) 800 MG tablet Take 1,600 mg by mouth See admin instructions. Take 2 tablets (1600 mg) three times daily with meals and with snacks     No current facility-administered medications for this visit.      PHYSICAL EXAMINATION: ECOG PERFORMANCE STATUS: 1 - Symptomatic but completely ambulatory Vitals:   03/06/18 0853  BP: (!) 183/111  Pulse: 81  Resp: 18  Temp: (!) 97.5 F (36.4 C)   Filed Weights   03/06/18 0853  Weight: 133 lb 1.6 oz (60.4 kg)    Physical Exam  Constitutional: He is oriented to person, place, and time. He appears well-developed and well-nourished. No distress.  HENT:  Head: Normocephalic and atraumatic.  Right Ear: External ear normal.  Left Ear: External ear normal.  Mouth/Throat: Oropharynx is clear and moist.  Eyes: Pupils are equal, round, and reactive to light. EOM are normal. No scleral  icterus.  Neck: Normal range of motion. Neck supple.  Cardiovascular: Normal rate, regular rhythm and normal heart sounds.  Pulmonary/Chest: Effort normal and breath sounds normal. No respiratory distress. He has no wheezes. He has no rales. He exhibits no tenderness.  Abdominal: Soft. Bowel sounds are normal. He exhibits no distension and no mass. There is no tenderness. There is no guarding.  Musculoskeletal: Normal range of motion. He exhibits no edema or deformity.  Lymphadenopathy:    He has no cervical adenopathy.  Neurological: He is alert and oriented to person, place, and time. He displays normal reflexes. No cranial nerve deficit. Coordination normal.  Skin: Skin is warm and dry. No rash noted. No erythema.  Psychiatric: He has a normal mood and affect. His behavior is normal. Thought content normal.     LABORATORY DATA:  I have reviewed the data as listed Lab Results  Component Value Date   WBC 7.6 08/04/2017   HGB 9.1 (L) 08/04/2017   HCT 28.5 (L) 08/04/2017   MCV 82.4 08/04/2017   PLT 235 08/04/2017   Recent Labs    07/23/17 0024  07/24/17 0733 07/30/17 0216 08/04/17 0951  NA 140   < > 138 139 141  K 5.0   < > 4.6 5.7* 4.8  CL 102   < > 100* 104 102  CO2 17*   < > 20* 17* 20*  GLUCOSE 102*   < > 102* 145* 119*  BUN 121*   < > 140* 132* 113*  CREATININE 18.57*   < > 19.87* 19.03* 18.15*  CALCIUM 9.0   < > 8.6* 9.1 9.9  GFRNONAA 3*   < > 2* 3* 3*  GFRAA 3*   < > 3* 3* 3*  PROT 6.4*  --   --  6.4* 7.1  ALBUMIN 3.1*  --  3.1* 3.0* 3.3*  AST 44*  --   --  39 38  ALT 60  --   --  49 56  ALKPHOS 152*  --   --  158* 132*  BILITOT 1.1  --   --  1.2 0.9   < > = values in  this interval not displayed.   Labs from [place] reviewed by me. 11/23/17.  Hemoglobin 8.5, vitamin B12 1199, folate 12.8, ferritin 1878.  Iron 59, TIBC 240.  WBC 6, MCV 88.6, platelet counts 2 41,000.  TSH 2.97 01/03/18   Hemoglobin 8.9, iron saturation 73, iron 160, TIBC 219, wbc 5.5, platelet  232,000,  01/07/2018 Hemoglobin 9.1 ferritin 1924, Iron saturation 38, TIBC 221 01/14/18  Hemoglobin 8.8, Hct 26.4. 03/06/2018 hemoglobin 7.1, HCT 22.4. ASSESSMENT & PLAN:  1. Anemia in ESRD (end-stage renal disease) ([place])   2. Uncontrolled hypertension   #Labs from [name] dialysis center reviewed. Hemoglobin 7.1, reduced from previous level.  No clinical signs of active bleeding. Will increase Aranesp to 220 MCG every 2 weeks.  Monitor hemoglobin counts.  Suspect occult blood loss if hemoglobin continues to drop.Marland Kitchen He has uncontrolled hypertension today.  Advised patient to take his antihypertensive medication as instructed in the come back on another day.  If blood pressure control is better, will proceed with a relapse 220 MCG  Repeat CBC in 2 weeks for further dose adjustment. All questions were answered. The patient knows to call the clinic with any problems questions or concerns. Cc Dr.Singh Return of visit: 2 weeks with CBC done at dialysis center prior Aranesp.Marland Kitchen    Earlie Server, MD, PhD Hematology Oncology [facility] at [facility] Pager- [phone] 03/06/2018

## 2018-03-06 NOTE — Progress Notes (Signed)
Patient here for follow up. No concerns voiced. Patients blood pressure elevated, he states that he did not take hydralazine and clonodine this morning because he has to pick them up from pharmacy. Denies lightheadedness, dizziness or headache.

## 2018-03-08 ENCOUNTER — Other Ambulatory Visit: Payer: Self-pay | Admitting: Oncology

## 2018-03-08 ENCOUNTER — Inpatient Hospital Stay: Payer: Medicare Other | Attending: Oncology

## 2018-03-08 VITALS — BP 157/87 | HR 75

## 2018-03-08 DIAGNOSIS — N186 End stage renal disease: Secondary | ICD-10-CM | POA: Diagnosis not present

## 2018-03-08 DIAGNOSIS — K219 Gastro-esophageal reflux disease without esophagitis: Secondary | ICD-10-CM | POA: Diagnosis not present

## 2018-03-08 DIAGNOSIS — R21 Rash and other nonspecific skin eruption: Secondary | ICD-10-CM | POA: Diagnosis not present

## 2018-03-08 DIAGNOSIS — R5382 Chronic fatigue, unspecified: Secondary | ICD-10-CM | POA: Diagnosis not present

## 2018-03-08 DIAGNOSIS — E785 Hyperlipidemia, unspecified: Secondary | ICD-10-CM | POA: Insufficient documentation

## 2018-03-08 DIAGNOSIS — I132 Hypertensive heart and chronic kidney disease with heart failure and with stage 5 chronic kidney disease, or end stage renal disease: Secondary | ICD-10-CM | POA: Insufficient documentation

## 2018-03-08 DIAGNOSIS — R5381 Other malaise: Secondary | ICD-10-CM | POA: Diagnosis not present

## 2018-03-08 DIAGNOSIS — Z79899 Other long term (current) drug therapy: Secondary | ICD-10-CM | POA: Diagnosis not present

## 2018-03-08 DIAGNOSIS — Z8673 Personal history of transient ischemic attack (TIA), and cerebral infarction without residual deficits: Secondary | ICD-10-CM | POA: Insufficient documentation

## 2018-03-08 DIAGNOSIS — Z992 Dependence on renal dialysis: Secondary | ICD-10-CM | POA: Diagnosis not present

## 2018-03-08 DIAGNOSIS — I252 Old myocardial infarction: Secondary | ICD-10-CM | POA: Diagnosis not present

## 2018-03-08 DIAGNOSIS — Z7982 Long term (current) use of aspirin: Secondary | ICD-10-CM | POA: Insufficient documentation

## 2018-03-08 DIAGNOSIS — I5042 Chronic combined systolic (congestive) and diastolic (congestive) heart failure: Secondary | ICD-10-CM | POA: Diagnosis not present

## 2018-03-08 DIAGNOSIS — D631 Anemia in chronic kidney disease: Secondary | ICD-10-CM | POA: Insufficient documentation

## 2018-03-08 MED ORDER — DARBEPOETIN ALFA 300 MCG/0.6ML IJ SOSY
225.0000 ug | PREFILLED_SYRINGE | Freq: Once | INTRAMUSCULAR | Status: DC
  Filled 2018-03-08: qty 0.6

## 2018-03-08 MED ORDER — DARBEPOETIN ALFA 300 MCG/0.6ML IJ SOSY: 225.0000 ug | PREFILLED_SYRINGE | Freq: Once | INTRAMUSCULAR | Status: DC

## 2018-03-08 MED ORDER — DARBEPOETIN ALFA 300 MCG/0.6ML IJ SOSY
225.0000 ug | PREFILLED_SYRINGE | Freq: Once | INTRAMUSCULAR | Status: AC
  Administered 2018-03-08: 225 ug via SUBCUTANEOUS
  Filled 2018-03-08: qty 0.6

## 2018-03-22 ENCOUNTER — Inpatient Hospital Stay (HOSPITAL_BASED_OUTPATIENT_CLINIC_OR_DEPARTMENT_OTHER): Payer: Medicare Other | Admitting: Oncology

## 2018-03-22 ENCOUNTER — Inpatient Hospital Stay: Payer: Medicare Other

## 2018-03-22 ENCOUNTER — Other Ambulatory Visit: Payer: Self-pay

## 2018-03-22 ENCOUNTER — Encounter: Payer: Self-pay | Admitting: Oncology

## 2018-03-22 VITALS — BP 160/109 | HR 86 | Temp 97.3°F | Resp 18 | Wt 133.9 lb

## 2018-03-22 VITALS — BP 158/98

## 2018-03-22 DIAGNOSIS — I5042 Chronic combined systolic (congestive) and diastolic (congestive) heart failure: Secondary | ICD-10-CM | POA: Diagnosis not present

## 2018-03-22 DIAGNOSIS — I132 Hypertensive heart and chronic kidney disease with heart failure and with stage 5 chronic kidney disease, or end stage renal disease: Secondary | ICD-10-CM | POA: Diagnosis not present

## 2018-03-22 DIAGNOSIS — R21 Rash and other nonspecific skin eruption: Secondary | ICD-10-CM

## 2018-03-22 DIAGNOSIS — Z79899 Other long term (current) drug therapy: Secondary | ICD-10-CM

## 2018-03-22 DIAGNOSIS — E785 Hyperlipidemia, unspecified: Secondary | ICD-10-CM

## 2018-03-22 DIAGNOSIS — I252 Old myocardial infarction: Secondary | ICD-10-CM

## 2018-03-22 DIAGNOSIS — D631 Anemia in chronic kidney disease: Secondary | ICD-10-CM

## 2018-03-22 DIAGNOSIS — Z992 Dependence on renal dialysis: Principal | ICD-10-CM

## 2018-03-22 DIAGNOSIS — Z7982 Long term (current) use of aspirin: Secondary | ICD-10-CM

## 2018-03-22 DIAGNOSIS — R5382 Chronic fatigue, unspecified: Secondary | ICD-10-CM

## 2018-03-22 DIAGNOSIS — R5381 Other malaise: Secondary | ICD-10-CM

## 2018-03-22 DIAGNOSIS — K219 Gastro-esophageal reflux disease without esophagitis: Secondary | ICD-10-CM

## 2018-03-22 DIAGNOSIS — N186 End stage renal disease: Secondary | ICD-10-CM

## 2018-03-22 DIAGNOSIS — Z8673 Personal history of transient ischemic attack (TIA), and cerebral infarction without residual deficits: Secondary | ICD-10-CM

## 2018-03-22 MED ORDER — DARBEPOETIN ALFA 300 MCG/0.6ML IJ SOSY
225.0000 ug | PREFILLED_SYRINGE | Freq: Once | INTRAMUSCULAR | Status: AC
  Administered 2018-03-22: 225 ug via SUBCUTANEOUS
  Filled 2018-03-22: qty 0.6

## 2018-03-22 NOTE — Progress Notes (Signed)
Printed lab results from [facility] drawn on 03/20/18, hemoglobin 7.1.  Dr. Tasia Catchings approves to proceed with Aranesp inj with bp 158/98.

## 2018-03-22 NOTE — Progress Notes (Signed)
Patient here for follow up. No changes since last visit. BP elevated, he states he took bp meds 30 minutes ago.

## 2018-03-22 NOTE — Progress Notes (Signed)
Hematology/Oncology Follow up note [facility] Telephone:([phone] Fax:(336) [phone]   Patient Care Team: Gwenlyn Saran, MD as PCP - General (Family Medicine) Burnell Blanks, MD as PCP - Cardiology (Cardiology)  REFERRING PROVIDER: Dr.Singh Sheilah Mins REASON FOR VISIT Follow up for treatment of anemia  HISTORY OF PRESENTING ILLNESS:  Angel [name] is a  47 y.o.  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  Patient reports that he has been getting Epogen with dialysis for about a year and a half.  Recently he has developed skin rash on buttock, also field questionable throat itching after he received Epogen injection.  Epogen was hold.  With further questioning, patient reports using new body wash during the same time period.  Patient has anemia of chronic kidney disease and was referred to [facility] for further evaluation. Patient reports feeling fatigue.  Denies any hematochezia or hematemesis, black tarry stool.  His rash improved after topical steroid cream  INTERVAL HISTORY Angel [name] is a 47 y.o. male who as above history reviewed by me today presents for follow-up visit for management of anemia secondary to chronic kidney disease.  Patient is on hemodialysis.  Currently he is on Aranesp 225 MCG every 2 weeks. As he does not have any good IV access and it very difficult to obtain labs, he has been getting his labs at dialysis center and faxed to [place] prior to the visit.  Today patient reports feeling well.  His blood pressure initially was high in the clinic patient took blood pressure medication 30 minutes prior to clinic visit.  After sitting a while in the exam room patient blood pressure was measured again.  160/109 Chronic fatigue at baseline.  No better or worse. Patient reports that he had high blood pressure medication  has been recently adjusted.  Losartan was added to his high blood   Review of Systems  Constitutional: Positive for malaise/fatigue. Negative for chills, fever and weight loss.  HENT: Negative for congestion, ear discharge, ear pain, nosebleeds, sinus pain and sore throat.   Eyes: Negative for double vision, photophobia, pain, discharge and redness.  Respiratory: Negative for cough, hemoptysis, sputum production, shortness of breath and wheezing.   Cardiovascular: Negative for chest pain, palpitations, orthopnea, claudication and leg swelling.  Gastrointestinal: Negative for abdominal pain, blood in stool, constipation, diarrhea, heartburn, melena, nausea and vomiting.  Genitourinary: Negative for dysuria, flank pain, frequency and hematuria.  Musculoskeletal: Negative for back pain, myalgias and neck pain.  Skin: Negative for itching and rash.  Neurological: Negative for dizziness, tingling, tremors, sensory change, speech change, focal weakness, weakness and headaches.  Endo/Heme/Allergies: Negative for environmental allergies. Does not bruise/bleed easily.  Psychiatric/Behavioral: Negative for depression and hallucinations. The patient is not nervous/anxious.     MEDICAL HISTORY:  Past Medical History:  Diagnosis Date  . AAA (abdominal aortic aneurysm) ([place])   . Chronic anemia   . Chronic combined systolic and diastolic CHF (congestive heart failure) ([place])   . End stage renal disease ([place])   . ESRD on dialysis ([place])   . FSGS (focal segmental glomerulosclerosis)   . GERD (gastroesophageal reflux disease)   . HLD (hyperlipidemia)   . Hypertension   . NSTEMI (non-ST elevated myocardial infarction) ([place]) 07/2017   pt refused cath  . Pericardial effusion    a. small by echo 06/2017.  Marland Kitchen  Renal disorder   . Seizure ([place])   . Stroke (cerebrum) [facility])     SURGICAL HISTORY: Past Surgical History:  Procedure Laterality Date  . ABDOMINAL AORTIC ANEURYSM REPAIR    . KIDNEY SURGERY       SOCIAL HISTORY: Social History   Socioeconomic History  . Marital status: Single    Spouse name: Not on file  . Number of children: Not on file  . Years of education: Not on file  . Highest education level: Not on file  Occupational History  . Not on file  Social Needs  . Financial resource strain: Not on file  . Food insecurity:    Worry: Not on file    Inability: Not on file  . Transportation needs:    Medical: Not on file    Non-medical: Not on file  Tobacco Use  . Smoking status: Never Smoker  . Smokeless tobacco: Never Used  Substance and Sexual Activity  . Alcohol use: No    Frequency: Never  . Drug use: No  . Sexual activity: Not on file  Lifestyle  . Physical activity:    Days per week: Not on file    Minutes per session: Not on file  . Stress: Not on file  Relationships  . Social connections:    Talks on phone: Not on file    Gets together: Not on file    Attends religious service: Not on file    Active member of club or organization: Not on file    Attends meetings of clubs or organizations: Not on file    Relationship status: Not on file  . Intimate partner violence:    Fear of current or ex partner: Not on file    Emotionally abused: Not on file    Physically abused: Not on file    Forced sexual activity: Not on file  Other Topics Concern  . Not on file  Social History Narrative  . Not on file    FAMILY HISTORY: Family History  Problem Relation Age of Onset  . Hypertension Mother   . Kidney disease Mother   . Asthma Father   . Hypertension Father   . Kidney disease Father   . Hypertension Sister   . Heart disease Sister   . Kidney disease Sister   . Heart disease Brother   . Hypertension Brother   . COPD Brother     ALLERGIES:  is allergic to clonidine derivatives and hydralazine hcl.  MEDICATIONS:  Current Outpatient Medications  Medication Sig Dispense Refill  . amLODipine (NORVASC) 10 MG tablet Take 10 mg by mouth daily.     Marland Kitchen aspirin EC 81 MG tablet Take 81 mg daily by mouth.    Marland Kitchen atorvastatin (LIPITOR) 40 MG tablet Take 40 mg at bedtime by mouth.    . calcitRIOL (ROCALTROL) 0.5 MCG capsule Take by mouth. Take 1 capsule by mouth every day    . carvedilol (COREG) 25 MG tablet Take 25 mg 2 (two) times daily with a meal by mouth.    . cholecalciferol (VITAMIN D) 1000 units tablet Take 1,000 Units by mouth 2 (two) times daily.    . cloNIDine (CATAPRES) 0.2 MG tablet Take 0.2 mg by mouth 2 (two) times daily.  23  . furosemide (LASIX) 80 MG tablet Take 80 mg at bedtime by mouth.    . hydrALAZINE (APRESOLINE) 100 MG tablet Take 100 mg by mouth 3 (three) times daily.    . hydrOXYzine (ATARAX/VISTARIL) 10  MG tablet Take by mouth. Take 1 tablet daily as needed    . Lactobacillus Rhamnosus, GG, (CULTURELLE) CAPS Take 1 capsule by mouth daily.    Marland Kitchen losartan (COZAAR) 50 MG tablet Take 50 mg by mouth every evening.  5  . multivitamin (RENA-VIT) TABS tablet Take 1 tablet by mouth 2 (two) times daily.    . Omega-3 Fatty Acids (OMEGA III EPA+DHA) 1000 MG CAPS     . pantoprazole (PROTONIX) 40 MG tablet TAKE 1 TABLET BY MOUTH EVERY DAY    . sevelamer carbonate (RENVELA) 800 MG tablet Take 1,600 mg by mouth See admin instructions. Take 2 tablets (1600 mg) three times daily with meals and with snacks    . WHITE PETROLATUM-MINERAL OIL OP Apply to incision twice a day PRN itching     No current facility-administered medications for this visit.      PHYSICAL EXAMINATION: ECOG PERFORMANCE STATUS: 1 - Symptomatic but completely ambulatory Vitals:   03/22/18 1035  BP: (!) 160/109  Pulse: 86  Resp: 18  Temp: (!) 97.3 F (36.3 C)   Filed Weights   03/22/18 1035  Weight: 133 lb 14.4 oz (60.7 kg)    Physical Exam  Constitutional: He is oriented to person, place, and time. He appears well-developed and well-nourished. No distress.  HENT:  Head: Normocephalic and atraumatic.  Right Ear: External ear normal.  Left Ear:  External ear normal.  Mouth/Throat: Oropharynx is clear and moist.  Eyes: Pupils are equal, round, and reactive to light. EOM are normal. No scleral icterus.  Neck: Normal range of motion. Neck supple.  Cardiovascular: Normal rate, regular rhythm and normal heart sounds.  Pulmonary/Chest: Effort normal and breath sounds normal. No respiratory distress. He has no wheezes. He has no rales. He exhibits no tenderness.  Abdominal: Soft. Bowel sounds are normal. He exhibits no distension and no mass. There is no tenderness. There is no guarding.  Musculoskeletal: Normal range of motion. He exhibits no edema or deformity.  Lymphadenopathy:    He has no cervical adenopathy.  Neurological: He is alert and oriented to person, place, and time. He displays normal reflexes. No cranial nerve deficit. Coordination normal.  Skin: Skin is warm and dry. No rash noted. No erythema.  Psychiatric: He has a normal mood and affect. His behavior is normal. Thought content normal.     LABORATORY DATA:  I have reviewed the data as listed Lab Results  Component Value Date   WBC 7.6 08/04/2017   HGB 9.1 (L) 08/04/2017   HCT 28.5 (L) 08/04/2017   MCV 82.4 08/04/2017   PLT 235 08/04/2017   Recent Labs    07/23/17 0024  07/24/17 0733 07/30/17 0216 08/04/17 0951  NA 140   < > 138 139 141  K 5.0   < > 4.6 5.7* 4.8  CL 102   < > 100* 104 102  CO2 17*   < > 20* 17* 20*  GLUCOSE 102*   < > 102* 145* 119*  BUN 121*   < > 140* 132* 113*  CREATININE 18.57*   < > 19.87* 19.03* 18.15*  CALCIUM 9.0   < > 8.6* 9.1 9.9  GFRNONAA 3*   < > 2* 3* 3*  GFRAA 3*   < > 3* 3* 3*  PROT 6.4*  --   --  6.4* 7.1  ALBUMIN 3.1*  --  3.1* 3.0* 3.3*  AST 44*  --   --  39 38  ALT 60  --   --  49 56  ALKPHOS 152*  --   --  158* 132*  BILITOT 1.1  --   --  1.2 0.9   < > = values in this interval not displayed.   Labs from [place] reviewed by me. 11/23/17.  Hemoglobin 8.5, vitamin B12 1199, folate 12.8, ferritin 1878.  Iron 59,  TIBC 240.  WBC 6, MCV 88.6, platelet counts 2 41,000.  TSH 2.97 01/03/18   Hemoglobin 8.9, iron saturation 73, iron 160, TIBC 219, wbc 5.5, platelet 232,000,  01/07/2018 Hemoglobin 9.1 ferritin 1924, Iron saturation 38, TIBC 221 01/14/18  Hemoglobin 8.8, Hct 26.4. 03/06/2018 hemoglobin 7.1, HCT 22.4. 03/20/2018 hemoglobin 7.1  ASSESSMENT & PLAN:  1. Anemia due to end stage renal disease ([place])   #Labs from [name] reviewed.  Hemoglobin 7.1, stable.  Continue Aranesp 225 MCG every 2 weeks.  Repeat CBC in 2 weeks prior to another dose of Aranesp. He will see me in 4 weeks for reassessment. All questions were answered. The patient knows to call the clinic with any problems questions or concerns. Cc Dr.Singh Return of visit: 4 weeks   Earlie Server, MD, PhD Hematology Oncology [facility] at [facility] Pager- [phone] 03/22/2018

## 2018-04-03 ENCOUNTER — Encounter (HOSPITAL_COMMUNITY): Payer: Self-pay | Admitting: Emergency Medicine

## 2018-04-03 ENCOUNTER — Other Ambulatory Visit: Payer: Self-pay

## 2018-04-03 ENCOUNTER — Emergency Department (HOSPITAL_COMMUNITY): Payer: Medicare Other

## 2018-04-03 ENCOUNTER — Inpatient Hospital Stay (HOSPITAL_COMMUNITY)
Admission: EM | Admit: 2018-04-03 | Discharge: 2018-04-05 | DRG: 640 | Disposition: A | Discharge status: Home / Self Care | Payer: Medicare Other | Attending: Internal Medicine | Admitting: Internal Medicine

## 2018-04-03 DIAGNOSIS — Z841 Family history of disorders of kidney and ureter: Secondary | ICD-10-CM

## 2018-04-03 DIAGNOSIS — N189 Chronic kidney disease, unspecified: Secondary | ICD-10-CM

## 2018-04-03 DIAGNOSIS — I5042 Chronic combined systolic (congestive) and diastolic (congestive) heart failure: Secondary | ICD-10-CM | POA: Diagnosis present

## 2018-04-03 DIAGNOSIS — E8770 Fluid overload, unspecified: Secondary | ICD-10-CM | POA: Diagnosis present

## 2018-04-03 DIAGNOSIS — E785 Hyperlipidemia, unspecified: Secondary | ICD-10-CM | POA: Diagnosis present

## 2018-04-03 DIAGNOSIS — R278 Other lack of coordination: Secondary | ICD-10-CM | POA: Diagnosis present

## 2018-04-03 DIAGNOSIS — Z8249 Family history of ischemic heart disease and other diseases of the circulatory system: Secondary | ICD-10-CM

## 2018-04-03 DIAGNOSIS — G9341 Metabolic encephalopathy: Secondary | ICD-10-CM

## 2018-04-03 DIAGNOSIS — N2581 Secondary hyperparathyroidism of renal origin: Secondary | ICD-10-CM | POA: Diagnosis present

## 2018-04-03 DIAGNOSIS — Z79899 Other long term (current) drug therapy: Secondary | ICD-10-CM | POA: Diagnosis not present

## 2018-04-03 DIAGNOSIS — Z9115 Patient's noncompliance with renal dialysis: Secondary | ICD-10-CM | POA: Diagnosis not present

## 2018-04-03 DIAGNOSIS — Z7982 Long term (current) use of aspirin: Secondary | ICD-10-CM

## 2018-04-03 DIAGNOSIS — E8729 Other acidosis: Secondary | ICD-10-CM | POA: Diagnosis present

## 2018-04-03 DIAGNOSIS — N051 Unspecified nephritic syndrome with focal and segmental glomerular lesions: Secondary | ICD-10-CM | POA: Diagnosis present

## 2018-04-03 DIAGNOSIS — I639 Cerebral infarction, unspecified: Secondary | ICD-10-CM | POA: Diagnosis present

## 2018-04-03 DIAGNOSIS — E872 Acidosis: Secondary | ICD-10-CM | POA: Diagnosis present

## 2018-04-03 DIAGNOSIS — Z91158 Patient's noncompliance with renal dialysis for other reason: Secondary | ICD-10-CM

## 2018-04-03 DIAGNOSIS — G9349 Other encephalopathy: Secondary | ICD-10-CM | POA: Diagnosis present

## 2018-04-03 DIAGNOSIS — Z992 Dependence on renal dialysis: Secondary | ICD-10-CM

## 2018-04-03 DIAGNOSIS — Z888 Allergy status to other drugs, medicaments and biological substances status: Secondary | ICD-10-CM

## 2018-04-03 DIAGNOSIS — I132 Hypertensive heart and chronic kidney disease with heart failure and with stage 5 chronic kidney disease, or end stage renal disease: Secondary | ICD-10-CM | POA: Diagnosis present

## 2018-04-03 DIAGNOSIS — R0902 Hypoxemia: Secondary | ICD-10-CM | POA: Diagnosis present

## 2018-04-03 DIAGNOSIS — N2589 Other disorders resulting from impaired renal tubular function: Secondary | ICD-10-CM

## 2018-04-03 DIAGNOSIS — I160 Hypertensive urgency: Secondary | ICD-10-CM | POA: Diagnosis present

## 2018-04-03 DIAGNOSIS — N269 Renal sclerosis, unspecified: Secondary | ICD-10-CM | POA: Diagnosis present

## 2018-04-03 DIAGNOSIS — E875 Hyperkalemia: Secondary | ICD-10-CM | POA: Diagnosis not present

## 2018-04-03 DIAGNOSIS — N19 Unspecified kidney failure: Secondary | ICD-10-CM | POA: Diagnosis present

## 2018-04-03 DIAGNOSIS — I2510 Atherosclerotic heart disease of native coronary artery without angina pectoris: Secondary | ICD-10-CM | POA: Diagnosis present

## 2018-04-03 DIAGNOSIS — N179 Acute kidney failure, unspecified: Secondary | ICD-10-CM | POA: Insufficient documentation

## 2018-04-03 DIAGNOSIS — N186 End stage renal disease: Secondary | ICD-10-CM

## 2018-04-03 DIAGNOSIS — K219 Gastro-esophageal reflux disease without esophagitis: Secondary | ICD-10-CM | POA: Diagnosis present

## 2018-04-03 DIAGNOSIS — Z8673 Personal history of transient ischemic attack (TIA), and cerebral infarction without residual deficits: Secondary | ICD-10-CM | POA: Diagnosis not present

## 2018-04-03 DIAGNOSIS — G40909 Epilepsy, unspecified, not intractable, without status epilepticus: Secondary | ICD-10-CM | POA: Diagnosis present

## 2018-04-03 DIAGNOSIS — I161 Hypertensive emergency: Secondary | ICD-10-CM | POA: Diagnosis present

## 2018-04-03 DIAGNOSIS — I252 Old myocardial infarction: Secondary | ICD-10-CM

## 2018-04-03 DIAGNOSIS — D631 Anemia in chronic kidney disease: Secondary | ICD-10-CM | POA: Diagnosis present

## 2018-04-03 LAB — I-STAT TROPONIN, ED: TROPONIN I, POC: 0.19 ng/mL — AB (ref 0.00–0.08)

## 2018-04-03 LAB — BASIC METABOLIC PANEL
ANION GAP: 28 — AB (ref 5–15)
Anion gap: 20 — ABNORMAL HIGH (ref 5–15)
BUN: 176 mg/dL — ABNORMAL HIGH (ref 6–20)
BUN: 83 mg/dL — ABNORMAL HIGH (ref 6–20)
CALCIUM: 9.9 mg/dL (ref 8.9–10.3)
CO2: 17 mmol/L — AB (ref 22–32)
CO2: 23 mmol/L (ref 22–32)
CREATININE: 26.32 mg/dL — AB (ref 0.61–1.24)
Calcium: 9.3 mg/dL (ref 8.9–10.3)
Chloride: 96 mmol/L — ABNORMAL LOW (ref 98–111)
Chloride: 97 mmol/L — ABNORMAL LOW (ref 98–111)
Creatinine, Ser: 15.81 mg/dL — ABNORMAL HIGH (ref 0.61–1.24)
GFR calc Af Amer: 2 mL/min — ABNORMAL LOW (ref >60)
GFR calc Af Amer: 4 mL/min — ABNORMAL LOW (ref >60)
GFR calc non Af Amer: 3 mL/min — ABNORMAL LOW (ref >60)
GFR, EST NON AFRICAN AMERICAN: 2 mL/min — AB (ref >60)
GLUCOSE: 114 mg/dL — AB (ref 70–99)
Glucose, Bld: 71 mg/dL (ref 70–99)
Potassium: 6.4 mmol/L (ref 3.5–5.1)
Potassium: 3.8 mmol/L (ref 3.5–5.1)
Sodium: 141 mmol/L (ref 135–145)
Sodium: 140 mmol/L (ref 135–145)

## 2018-04-03 LAB — CBC
HCT: 26.5 % — ABNORMAL LOW (ref 39.0–52.0)
Hemoglobin: 8.5 g/dL — ABNORMAL LOW (ref 13.0–17.0)
MCH: 27.3 pg (ref 26.0–34.0)
MCHC: 32.1 g/dL (ref 30.0–36.0)
MCV: 85.2 fL (ref 78.0–100.0)
PLATELETS: 271 10*3/uL (ref 150–400)
RBC: 3.11 MIL/uL — ABNORMAL LOW (ref 4.22–5.81)
RDW: 18.4 % — AB (ref 11.5–15.5)
WBC: 8.4 10*3/uL (ref 4.0–10.5)

## 2018-04-03 LAB — IRON AND TIBC
Iron: 33 ug/dL — ABNORMAL LOW (ref 45–182)
SATURATION RATIOS: 14 % — AB (ref 17.9–39.5)
TIBC: 237 ug/dL — ABNORMAL LOW (ref 250–450)
UIBC: 204 ug/dL

## 2018-04-03 LAB — GLUCOSE, CAPILLARY: Glucose-Capillary: 90 mg/dL (ref 70–99)

## 2018-04-03 LAB — FERRITIN: FERRITIN: 1690 ng/mL — AB (ref 24–336)

## 2018-04-03 LAB — PHOSPHORUS: Phosphorus: 5 mg/dL — ABNORMAL HIGH (ref 2.5–4.6)

## 2018-04-03 MED ORDER — PANTOPRAZOLE SODIUM 40 MG PO TBEC
40.0000 mg | DELAYED_RELEASE_TABLET | Freq: Every day | ORAL | Status: DC
  Administered 2018-04-04 – 2018-04-05 (×2): 40 mg via ORAL
  Filled 2018-04-03 (×2): qty 1

## 2018-04-03 MED ORDER — CHLORHEXIDINE GLUCONATE CLOTH 2 % EX PADS
6.0000 | MEDICATED_PAD | Freq: Every day | CUTANEOUS | Status: DC
  Administered 2018-04-04 – 2018-04-05 (×2): 6 via TOPICAL

## 2018-04-03 MED ORDER — RENA-VITE PO TABS
1.0000 | ORAL_TABLET | Freq: Two times a day (BID) | ORAL | Status: DC
  Administered 2018-04-03 – 2018-04-05 (×4): 1 via ORAL
  Filled 2018-04-03 (×4): qty 1

## 2018-04-03 MED ORDER — INSULIN ASPART 100 UNIT/ML ~~LOC~~ SOLN
10.0000 [IU] | Freq: Once | SUBCUTANEOUS | Status: AC
  Administered 2018-04-03: 10 [IU] via INTRAVENOUS
  Filled 2018-04-03: qty 1

## 2018-04-03 MED ORDER — ASPIRIN EC 81 MG PO TBEC
81.0000 mg | DELAYED_RELEASE_TABLET | Freq: Every day | ORAL | Status: DC
  Administered 2018-04-04 – 2018-04-05 (×2): 81 mg via ORAL
  Filled 2018-04-03 (×2): qty 1

## 2018-04-03 MED ORDER — ATORVASTATIN CALCIUM 40 MG PO TABS
40.0000 mg | ORAL_TABLET | Freq: Every day | ORAL | Status: DC
  Administered 2018-04-03 – 2018-04-04 (×2): 40 mg via ORAL
  Filled 2018-04-03: qty 1
  Filled 2018-04-03: qty 2

## 2018-04-03 MED ORDER — VITAMIN D 1000 UNITS PO TABS
1000.0000 [IU] | ORAL_TABLET | Freq: Two times a day (BID) | ORAL | Status: DC
  Administered 2018-04-03 – 2018-04-05 (×4): 1000 [IU] via ORAL
  Filled 2018-04-03 (×4): qty 1

## 2018-04-03 MED ORDER — LABETALOL HCL 5 MG/ML IV SOLN
20.0000 mg | Freq: Once | INTRAVENOUS | Status: AC
  Administered 2018-04-03: 20 mg via INTRAVENOUS
  Filled 2018-04-03 (×3): qty 4

## 2018-04-03 MED ORDER — AMLODIPINE BESYLATE 10 MG PO TABS
10.0000 mg | ORAL_TABLET | Freq: Every day | ORAL | Status: DC
  Administered 2018-04-03 – 2018-04-05 (×3): 10 mg via ORAL
  Filled 2018-04-03: qty 1
  Filled 2018-04-03 (×3): qty 2

## 2018-04-03 MED ORDER — INSULIN ASPART 100 UNIT/ML IV SOLN: 10.0000 [IU] | Freq: Once | INTRAVENOUS | Status: DC

## 2018-04-03 MED ORDER — SODIUM BICARBONATE 8.4 % IV SOLN
50.0000 meq | Freq: Once | INTRAVENOUS | Status: AC
  Administered 2018-04-03: 50 meq via INTRAVENOUS
  Filled 2018-04-03: qty 50

## 2018-04-03 MED ORDER — FUROSEMIDE 80 MG PO TABS
80.0000 mg | ORAL_TABLET | Freq: Every day | ORAL | Status: DC
  Administered 2018-04-03 – 2018-04-04 (×2): 80 mg via ORAL
  Filled 2018-04-03: qty 1
  Filled 2018-04-03: qty 4

## 2018-04-03 MED ORDER — DEXTROSE 50 % IV SOLN
1.0000 | Freq: Once | INTRAVENOUS | Status: AC
  Administered 2018-04-03: 50 mL via INTRAVENOUS
  Filled 2018-04-03: qty 50

## 2018-04-03 MED ORDER — CALCIUM GLUCONATE 10 % IV SOLN
1.0000 g | Freq: Once | INTRAVENOUS | Status: AC
  Administered 2018-04-03: 1 g via INTRAVENOUS
  Filled 2018-04-03: qty 10

## 2018-04-03 MED ORDER — HEPARIN SODIUM (PORCINE) 5000 UNIT/ML IJ SOLN
5000.0000 [IU] | Freq: Three times a day (TID) | INTRAMUSCULAR | Status: DC
  Administered 2018-04-03 – 2018-04-04 (×2): 5000 [IU] via SUBCUTANEOUS
  Filled 2018-04-03 (×3): qty 1

## 2018-04-03 MED ORDER — HYDRALAZINE HCL 20 MG/ML IJ SOLN
10.0000 mg | Freq: Once | INTRAMUSCULAR | Status: AC
  Administered 2018-04-03: 10 mg via INTRAVENOUS
  Filled 2018-04-03: qty 1

## 2018-04-03 MED ORDER — SEVELAMER CARBONATE 800 MG PO TABS: 1600.0000 mg | ORAL_TABLET | ORAL | Status: DC

## 2018-04-03 MED ORDER — CALCITRIOL 0.5 MCG PO CAPS
0.5000 ug | ORAL_CAPSULE | Freq: Every day | ORAL | Status: DC
  Administered 2018-04-04 – 2018-04-05 (×2): 0.5 ug via ORAL
  Filled 2018-04-03 (×2): qty 1

## 2018-04-03 MED ORDER — HYDRALAZINE HCL 20 MG/ML IJ SOLN
10.0000 mg | INTRAMUSCULAR | Status: DC | PRN
  Administered 2018-04-03 – 2018-04-05 (×5): 10 mg via INTRAVENOUS
  Filled 2018-04-03 (×4): qty 1

## 2018-04-03 MED ORDER — LOSARTAN POTASSIUM 50 MG PO TABS
50.0000 mg | ORAL_TABLET | Freq: Every evening | ORAL | Status: DC
  Administered 2018-04-03 – 2018-04-04 (×2): 50 mg via ORAL
  Filled 2018-04-03 (×2): qty 1

## 2018-04-03 MED ORDER — CARVEDILOL 25 MG PO TABS
25.0000 mg | ORAL_TABLET | Freq: Two times a day (BID) | ORAL | Status: DC
  Administered 2018-04-03 – 2018-04-04 (×3): 25 mg via ORAL
  Filled 2018-04-03 (×3): qty 2

## 2018-04-03 MED ORDER — HYDRALAZINE HCL 50 MG PO TABS
100.0000 mg | ORAL_TABLET | Freq: Three times a day (TID) | ORAL | Status: DC
  Administered 2018-04-03 – 2018-04-05 (×5): 100 mg via ORAL
  Filled 2018-04-03 (×6): qty 2

## 2018-04-03 NOTE — Progress Notes (Signed)
I was asked to see Angel [name] r/t his lethargy.  Upon arrival, [name] was sitting upright in the bed, asleep.  He woke easily to voice and would intermittently fall asleep while in conversation.  He is oriented to person and situation.  He missed month (stated July) and president (stated Clinton) but could explain the circumstances that led to him coming to the hospital.  Bilateral strength is equal for all extremities.  No focal deficits identified.  Pt is currently hypertensive and being treated for his blood pressure.  Nursing reports he is more awake now then at the change of shift.  I also recommended checking a CBG to r/o hypoglycemia.  Pt reports no drug use, however a UDS may help r/o cause for his somnulence.

## 2018-04-03 NOTE — ED Notes (Signed)
Unable to gain IV access x 2, second RN attempting IV access

## 2018-04-03 NOTE — H&P (Signed)
History and Physical    Hawley Michel Ekstrand QZE:[id number] DOB: [birth year] DOA: 04/03/2018  PCP: Gwenlyn Saran, MD Consultants:  Nephrology Patient coming from: Home- lives with his 47 year-old son  Chief Complaint: Tremor, weakness  HPI: Angel [name] is a 47 y.o. male with medical history significant for ESRD 2/2 FSGS on HD TTS in [place] (Margarito Courser is nephrologist) via HD catheter in R chest, AAA s/p repair, CAD, NSTEMI, CVA, seizure, uncontrolled HTN, anemia, chronic combined CHF. He stopped going to HD 2 weeks ago because he wants to get "a more professional outlook on the process; to get another outlook from another doctor." He was diagnosed with ESRD approximately 2 years ago. He presented to the ED today with his mother with c/o 3-4 days of worsening tremors and weakness. He has also had a nonproductive cough for about a week. No fevers/chills, denies dyspnea, chest pain, HA, blurry vision. Has had some increased LE edema. He does make urine, has been urinating per usual. He stopped taking all of his medications 2 weeks ago, saying "they were not working; they were elevating my blood pressure." Specifically he thinks EPO was raising his BP and therefore he got "fed up" with the doctors and decided to stop taking all of his medications. The history is felt not reliable due to patient's somnolence. His mother is in the room but she is unable to provide additional history. He states his dry weight is 130 kg.   ED Course:  Encephalopathic, somnolent but oriented Hypertensive Hypoxic Elevated anion gap, uremic Hypertensive EKG looks ok, no significant TW peaking. QTc mildly prolonged K 6.4, ordered Ca gluconate, insulin/D50 and bicarb   Review of Systems: As per HPI; otherwise review of systems reviewed and negative.   Ambulatory Status:  Ambulates without assistance  Past Medical History:  Diagnosis Date  . AAA (abdominal aortic aneurysm) ([place])   . Chronic anemia   .  Chronic combined systolic and diastolic CHF (congestive heart failure) ([place])   . End stage renal disease ([place])   . ESRD on dialysis ([place])   . FSGS (focal segmental glomerulosclerosis)   . GERD (gastroesophageal reflux disease)   . HLD (hyperlipidemia)   . Hypertension   . NSTEMI (non-ST elevated myocardial infarction) ([place]) 07/2017   pt refused cath  . Pericardial effusion    a. small by echo 06/2017.  Marland Kitchen Renal disorder   . Seizure ([place])   . Stroke (cerebrum) [facility])     Past Surgical History:  Procedure Laterality Date  . ABDOMINAL AORTIC ANEURYSM REPAIR    . KIDNEY SURGERY      Social History   Socioeconomic History  . Marital status: Single    Spouse name: Not on file  . Number of children: Not on file  . Years of education: Not on file  . Highest education level: Not on file  Occupational History  . Not on file  Social Needs  . Financial resource strain: Not on file  . Food insecurity:    Worry: Not on file    Inability: Not on file  . Transportation needs:    Medical: Not on file    Non-medical: Not on file  Tobacco Use  . Smoking status: Never Smoker  . Smokeless tobacco: Never Used  Substance and Sexual Activity  . Alcohol use: No    Frequency: Never  . Drug use: No  . Sexual activity: Not on file  Lifestyle  . Physical activity:    Days  per week: Not on file    Minutes per session: Not on file  . Stress: Not on file  Relationships  . Social connections:    Talks on phone: Not on file    Gets together: Not on file    Attends religious service: Not on file    Active member of club or organization: Not on file    Attends meetings of clubs or organizations: Not on file    Relationship status: Not on file  . Intimate partner violence:    Fear of current or ex partner: Not on file    Emotionally abused: Not on file    Physically abused: Not on file    Forced sexual activity: Not on file  Other Topics Concern  . Not on file  Social History Narrative    . Not on file    Allergies  Allergen Reactions  . Clonidine Derivatives Swelling and Other (See Comments)    Leg swelling  . Hydralazine Hcl Swelling and Other (See Comments)    Leg swelling     Family History  Problem Relation Age of Onset  . Hypertension Mother   . Kidney disease Mother   . Asthma Father   . Hypertension Father   . Kidney disease Father   . Hypertension Sister   . Heart disease Sister   . Kidney disease Sister   . Heart disease Brother   . Hypertension Brother   . COPD Brother     Prior to Admission medications   Medication Sig Start Date End Date Taking? Authorizing Provider  amLODipine (NORVASC) 10 MG tablet Take 10 mg by mouth daily.    [provider]  aspirin EC 81 MG tablet Take 81 mg daily by mouth.    [provider]  atorvastatin (LIPITOR) 40 MG tablet Take 40 mg at bedtime by mouth.    [provider]  calcitRIOL (ROCALTROL) 0.5 MCG capsule Take by mouth. Take 1 capsule by mouth every day    [provider]  carvedilol (COREG) 25 MG tablet Take 25 mg 2 (two) times daily with a meal by mouth.    [provider]  cholecalciferol (VITAMIN D) 1000 units tablet Take 1,000 Units by mouth 2 (two) times daily.    [provider]  cloNIDine (CATAPRES) 0.2 MG tablet Take 0.2 mg by mouth 2 (two) times daily. 10/19/17   [provider]  furosemide (LASIX) 80 MG tablet Take 80 mg at bedtime by mouth.    [provider]  hydrALAZINE (APRESOLINE) 100 MG tablet Take 100 mg by mouth 3 (three) times daily.    [provider]  hydrOXYzine (ATARAX/VISTARIL) 10 MG tablet Take by mouth. Take 1 tablet daily as needed    [provider]  Lactobacillus Rhamnosus, GG, (CULTURELLE) CAPS Take 1 capsule by mouth daily.    [provider]  losartan (COZAAR) 50 MG tablet Take 50 mg by mouth every evening. 10/06/17   [provider]  multivitamin (RENA-VIT) TABS tablet Take  1 tablet by mouth 2 (two) times daily.    [provider]  Omega-3 Fatty Acids (OMEGA III EPA+DHA) 1000 MG CAPS     [provider]  pantoprazole (PROTONIX) 40 MG tablet TAKE 1 TABLET BY MOUTH EVERY DAY 07/03/16   [provider]  sevelamer carbonate (RENVELA) 800 MG tablet Take 1,600 mg by mouth See admin instructions. Take 2 tablets (1600 mg) three times daily with meals and with snacks  [provider]  WHITE PETROLATUM-MINERAL OIL OP Apply to incision twice a day PRN itching 03/12/17   [provider]    Physical Exam: Vitals:   04/03/18 1110 04/03/18 1300  BP: (!) 255/140 (!) 228/134  Pulse: 95 84  Resp: 18 15  Temp: 97.7 F (36.5 C)   TempSrc: Oral   SpO2: 94% 98%    . General: Ill-appearing male in mild distress, somnolent but arousable, significant facial edema present . Eyes: Periorbital / lid edema with conjunctival injection, PERRL, EOMI, iris . ENT:  grossly normal hearing, lips & tongue, mmm; appropriate dentition . Neck:  no LAD, masses or thyromegaly; no carotid bruits . Cardiovascular:  normal rate, reg rhythm, no murmur. Marland Kitchen Respiratory: Mild rhonchi bilaterally. No wheeze/rales. Increased respiratory effort. . Abdomen:  soft, NT, ND, NABS . Back: grossly normal alignment, no CVAT . Skin: no rash or induration seen on limited exam . Musculoskeletal:  grossly normal tone BUE/BLE, good ROM, no bony abnormality or obvious joint deformity . Lower extremity:  2+ pitting BLE edema.  Limited foot exam with no ulcerations.  1+ distal pulses. Marland Kitchen Psychiatric: somnolent, falls asleep while answering questions. Easily arousable, displays word-finding difficulty, some memory deficits. . Neurologic:  CN 2-12 grossly intact, moves all extremities, sensation intact. He is generally weak in all extremities, with worsening tremor with strength testing.    Radiological Exams on Admission: Dg Chest 2 View  Result Date: 04/03/2018 CLINICAL  DATA:  has not been in to dialysis in 2 weeks - reports ''productive cough'' pt unable to keep arms up over head for lateral view. EXAM: CHEST - 2 VIEW COMPARISON:  None. FINDINGS: Dialysis catheter distal tip: Right atrium. Moderate cardiomegaly with bilateral indistinctly marginated perihilar densities favoring mild airspace edema. Faint Kerley B lines. Slight thickening of the fissures and mild blunting of the posterior costophrenic angles. IMPRESSION: 1. Moderate cardiomegaly with indistinct perihilar airspace opacities compatible with moderate acute pulmonary edema. 2. Small bilateral pleural effusions. Electronically Signed   By: Van Clines M.D.   On: 04/03/2018 12:31    EKG: Independently reviewed.     TTE 06/2017 - Left ventricle: The cavity size was normal. There was moderate concentric hypertrophy. Systolic function was severely reduced. The estimated ejection fraction was in the range of 25% to 30%. Severe diffuse hypokinesis with no identifiable regional variations. Features are consistent with a pseudonormal left ventricular filling pattern, with concomitant abnormal relaxation and increased filling pressure (grade 2 diastolic dysfunction). - Aortic valve: There was trivial regurgitation. - Left atrium: The atrium was moderately dilated. - Tricuspid valve: There was moderate regurgitation. - Pericardium, extracardiac: A small pericardial effusion was identified circumferential to the heart. There was no evidence of hemodynamic compromise.  Labs on Admission: I have personally reviewed the available labs and imaging studies at the time of the admission.  Pertinent labs:  Na 141 K 6.4, no visible hemolysis Cl 96 CO2 17 Gluc 114 BUN 176 Creat 26.32 Ca 9.9 AG 28  TnI 0.19  WBC 8.4 Hgb 8.5 Plt 271  Assessment/Plan Principal Problem:   Hyperkalemia Active Problems:   HLD (hyperlipidemia)   Stroke (cerebrum) ([name])   ESRD (end stage renal  disease) on dialysis ([name])   Hypertensive urgency   Fluid overload   Anemia in ESRD (end-stage renal disease) ([name])   Hypertensive emergency   End-stage renal disease needing dialysis ([place])   Metabolic acidosis, increased anion gap   Uremia   Dialysis patient, noncompliant ([place])  Chronic combined systolic and diastolic CHF (congestive heart failure) ([name])   FSGS (focal segmental glomerulosclerosis)   Uremia, hyperkalemia, elevated anion gap metabolic acidosis, hypertensive urgency and fluid overload in the setting of ESRD and 2 weeks of missed HD -nephrology is on board; pt is on his way to HD now -uremia most likely causing his tremor and encephalopathy -elevated anion gap due to uremia -hyperkalemia: s/p calcium gluconate, insulin/D50, bicarb.  -repeat BMP after HD and in a.m.  Chronic systolic and diastolic CHF with volume overload -HD today with UF -daily weights, fluid restriction, low sodium, renal diet  Anemia 2/2 ESRD -f/u iron studies, renal recs -CBC in a.m.  HTN: uncontrolled, noncompliant with his medications -resume home antihypertensives: norvasc, carvedilol, hydralazine, losartan, clonidine, lasix  CAD  -cont ASA, statin  Secondary hyperparathyroidism -f/u P level, then decide on whether to start P binder -outpatient records requested; renal to decide whether pt needs calcitriol vs sensipar  Noncompliance: this appears to be persistent and recurrent. -pt has been counseled extensively today; unclear how much he retained given his mental status -recommend psychiatric eval and continued education while inpatient; pt is high risk of readmission if these issues are not addressed   DVT prophylaxis: Heparin Code Status:  Full - confirmed with patient/family Family Communication: Mother at bedside  Disposition Plan:  Home once clinically improved Consults called: Nephrology  Admission status: Admit - It is my clinical opinion that admission to INPATIENT is  reasonable and necessary because of the expectation that this patient will require hospital care that crosses at least 2 midnights to treat this condition based on the medical complexity of the problems presented.  Given the aforementioned information, the predictability of an adverse outcome is felt to be significant.   Janora Norlander MD [facility]  If note is complete, please contact covering daytime or nighttime physician. [email or web address] Password [email or web address]  04/03/2018, 1:13 PM

## 2018-04-03 NOTE — ED Provider Notes (Signed)
[place] EMERGENCY DEPARTMENT Provider Note   CSN: [id number] Arrival date & time: 04/03/18  1103     History   Chief Complaint Chief Complaint  Patient presents with  . Tremors    HPI Angel [name] is a 47 y.o. male who presents with uncontrollable tremors. The patient has a hx of FSGS and ESRD and is supposed to dialyze at [facility] in [place].  The patient states that he is taking himself off of dialysis by some natural herbs.  The patient complains that he cannot hold his arms up for very long and they get weak and tremulous.  His family member who is at bedside states that he has been like this for 2 days including confusion and being very sleepy.  The patient believes that has been misdiagnosed and that the reason he has high blood pressure is because of the EPO he was receiving for anemia.  He also believes that there was nothing wrong with his kidneys until he was seen by the transplant team and told that he had aneurysms.  He believes that he is being given high blood pressure because he is given sodium at dialysis.  He denies chest pain, shortness of breath.  He is alert and oriented.  HPI  Past Medical History:  Diagnosis Date  . AAA (abdominal aortic aneurysm) (Angel [name])   . Chronic anemia   . Chronic combined systolic and diastolic CHF (congestive heart failure) (Angel [name])   . End stage renal disease (Angel [name])   . ESRD on dialysis (Angel [name])   . FSGS (focal segmental glomerulosclerosis)   . GERD (gastroesophageal reflux disease)   . HLD (hyperlipidemia)   . Hypertension   . NSTEMI (non-ST elevated myocardial infarction) ([place]) 07/2017   pt refused cath  . Pericardial effusion    a. small by echo 06/2017.  Marland Kitchen Renal disorder   . Seizure (Angel [name])   . Stroke (cerebrum) [facility])     Patient Active Problem List   Diagnosis Date Noted  . Chronic combined systolic and diastolic CHF (congestive heart failure) ([place]) 08/14/2017  . Thrombocytopenia ([place]) 07/30/2017  .  Metabolic acidosis, increased anion gap 07/23/2017  . Uremia 07/23/2017  . Dialysis patient, noncompliant ([place])   . End-stage renal disease needing dialysis ([place]) 07/13/2017  . HTN (hypertension) 07/13/2017  . Non-ST elevation (NSTEMI) myocardial infarction ([place])   . Elevated troponin   . Hypertensive emergency 06/24/2017  . Hypertensive urgency 06/20/2017  . Fluid overload 06/20/2017  . Hyperkalemia 06/20/2017  . GERD (gastroesophageal reflux disease) 06/20/2017  . Seizure disorder ([name]) 06/20/2017  . Abnormal LFTs 06/20/2017  . Anemia in ESRD (end-stage renal disease) (Angel [name]) 06/20/2017  . HLD (hyperlipidemia)   . Stroke (cerebrum) (Angel [name])   . ESRD (end stage renal disease) on dialysis ([place])   . Acute on chronic diastolic CHF (congestive heart failure) (Angel [name])   . Acute post-operative pain 02/14/2017  . Acute respiratory insufficiency 02/14/2017  . Febrile nonhemolytic transfusion reaction 02/13/2017  . FSGS (focal segmental glomerulosclerosis) 02/06/2017  . History of stroke 09/13/2016  . Palpitations 09/13/2016  . Abdominal aortic aneurysm (AAA) without rupture (Angel [name]) 07/21/2016  . 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 Pre-transplant evaluation for kidney transplant 03/20/2016    Past Surgical History:  Procedure Laterality Date  . ABDOMINAL AORTIC ANEURYSM REPAIR    . KIDNEY SURGERY          Home Medications    Prior to Admission medications   Medication Sig Start Date  End Date Taking? Authorizing Provider  amLODipine (NORVASC) 10 MG tablet Take 10 mg by mouth daily.    [provider]  aspirin EC 81 MG tablet Take 81 mg daily by mouth.    [provider]  atorvastatin (LIPITOR) 40 MG tablet Take 40 mg at bedtime by mouth.    [provider]  calcitRIOL (ROCALTROL) 0.5 MCG capsule Take by mouth. Take 1 capsule by mouth every day    [provider]  carvedilol (COREG) 25 MG tablet Take 25 mg 2 (two) times daily with a meal by  mouth.    [provider]  cholecalciferol (VITAMIN D) 1000 units tablet Take 1,000 Units by mouth 2 (two) times daily.    [provider]  cloNIDine (CATAPRES) 0.2 MG tablet Take 0.2 mg by mouth 2 (two) times daily. 10/19/17   [provider]  furosemide (LASIX) 80 MG tablet Take 80 mg at bedtime by mouth.    [provider]  hydrALAZINE (APRESOLINE) 100 MG tablet Take 100 mg by mouth 3 (three) times daily.    [provider]  hydrOXYzine (ATARAX/VISTARIL) 10 MG tablet Take by mouth. Take 1 tablet daily as needed    [provider]  Lactobacillus Rhamnosus, GG, (CULTURELLE) CAPS Take 1 capsule by mouth daily.    [provider]  losartan (COZAAR) 50 MG tablet Take 50 mg by mouth every evening. 10/06/17   [provider]  multivitamin (RENA-VIT) TABS tablet Take 1 tablet by mouth 2 (two) times daily.    [provider]  Omega-3 Fatty Acids (OMEGA III EPA+DHA) 1000 MG CAPS     [provider]  pantoprazole (PROTONIX) 40 MG tablet TAKE 1 TABLET BY MOUTH EVERY DAY 07/03/16   [provider]  sevelamer carbonate (RENVELA) 800 MG tablet Take 1,600 mg by mouth See admin instructions. Take 2 tablets (1600 mg) three times daily with meals and with snacks    [provider]  WHITE PETROLATUM-MINERAL OIL OP Apply to incision twice a day PRN itching 03/12/17   [provider]    Family History Family History  Problem Relation Age of Onset  . Hypertension Mother   . Kidney disease Mother   . Asthma Father   . Hypertension Father   . Kidney disease Father   . Hypertension Sister   . Heart disease Sister   . Kidney disease Sister   . Heart disease Brother   . Hypertension Brother   . COPD Brother     Social History Social History   Tobacco Use  . Smoking status: Never Smoker  . Smokeless tobacco: Never Used  Substance Use Topics  . Alcohol use: No    Frequency: Never  . Drug use:  No     Allergies   Clonidine derivatives and Hydralazine hcl   Review of Systems Review of Systems Ten systems reviewed and are negative for acute change, except as noted in the HPI.    Physical Exam Updated Vital Signs BP (!) 255/140 (BP Location: Right Arm)   Pulse 95   Temp 97.7 F (36.5 C) (Oral)   Resp 18   SpO2 94%   Physical Exam  Constitutional: He is oriented to person, place, and time. He appears well-developed and well-nourished.  HENT:  Head: Normocephalic.  [place] facial edema especially orbital.  Swelling includes the conjunctiva and causes bilateral chemosis without injection  Eyes: Pupils are equal, round, and reactive to light. EOM are normal.  Neck: Normal  range of motion. Neck supple. JVD present.  Cardiovascular: Normal rate.  Pulmonary/Chest: Effort normal and breath sounds normal. No respiratory distress.  Abdominal: Soft. Bowel sounds are normal. There is no tenderness.  Musculoskeletal: He exhibits edema.  Neurological: He is oriented to person, place, and time.  And oriented but intermittently lethargic, having trouble completing thoughts and sentences and [name] asleep at times. Patient becomes weak and tremulous with any effort or exertion of lifting the extremities.  Skin: Skin is warm and dry.  Nursing note and vitals reviewed.    ED Treatments / Results  Labs (all labs ordered are listed, but only abnormal results are displayed) Labs Reviewed  BASIC METABOLIC PANEL - Abnormal; Notable for the following components:      Result Value   Potassium 6.4 (*)    Chloride 96 (*)    CO2 17 (*)    Glucose, Bld 114 (*)    BUN 176 (*)    Anion gap 28 (*)    All other components within normal limits  CBC - Abnormal; Notable for the following components:   RBC 3.11 (*)    Hemoglobin 8.5 (*)    HCT 26.5 (*)    RDW 18.4 (*)    All other components within normal limits  I-STAT TROPONIN, ED - Abnormal; Notable for the following components:    Troponin i, poc 0.19 (*)    All other components within normal limits  I-STAT CHEM 8, ED    EKG None  Radiology Dg Chest 2 View  Result Date: 04/03/2018 CLINICAL DATA:  has not been in to dialysis in 2 weeks - reports ''productive cough'' pt unable to keep arms up over head for lateral view. EXAM: CHEST - 2 VIEW COMPARISON:  None. FINDINGS: Dialysis catheter distal tip: Right atrium. Moderate cardiomegaly with bilateral indistinctly marginated perihilar densities favoring mild airspace edema. Faint Kerley B lines. Slight thickening of the fissures and mild blunting of the posterior costophrenic angles. IMPRESSION: 1. Moderate cardiomegaly with indistinct perihilar airspace opacities compatible with moderate acute pulmonary edema. 2. Small bilateral pleural effusions. Electronically Signed   By: Van Clines M.D.   On: 04/03/2018 12:31    Procedures .Critical Care Performed by: Margarita Mail, PA-C Authorized by: Margarita Mail, PA-C   Critical care provider statement:    Critical care time (minutes):  50   Critical care was necessary to treat or prevent imminent or life-threatening deterioration of the following conditions:  Metabolic crisis and renal failure   Critical care was time spent personally by me on the following activities:  Discussions with consultants, evaluation of patient's response to treatment, examination of patient, ordering and performing treatments and interventions, ordering and review of laboratory studies, ordering and review of radiographic studies, pulse oximetry, re-evaluation of patient's condition, obtaining history from patient or surrogate and review of old charts   (including critical care time)  Medications Ordered in ED Medications - No data to display   Initial Impression / Assessment and Plan / ED Course  I have reviewed the triage vital signs and the nursing notes.  Pertinent labs & imaging results that were available during my care of the  patient were reviewed by me and considered in my medical decision making (see chart for details).  Clinical Course as of Apr 03 1302  Wed Apr 03, 2018  1301 Potassium(!!): 6.4 [AH]  1301 BUN(!): 176 [AH]  1301 Creatinine(!): 26.32 [AH]  1301 Anion gap(!): 28 [AH]  1301 Troponin i, poc(!!): 0.19 [  AH]  1302 Patient sadly is in severe denial.  He has marketed uremia with an anion gap and uremic encephalopathy.  I do not feel however his judgment is being affected.   Hemoglobin(!): 8.5 [AH]  1302 Patient's potassium 6.4.  I have ordered temporizing agents.   [AH]  0488 Patient's EKG reviewed and is normal sinus rhythm his T waves are elevated only in V4.   [AH]    Clinical Course User Index [AH] Margarita Mail, PA-C    Patient with uremic encephalopathy, hyperkalemia, hypertensive urgency secondary to his need for acute dialysis.  Temporizing agents given for his hyper K here in the emergency department.  EKG reviewed and shows no significant peaked T waves in with his hyperkalemia.  The patient is somnolent but oriented.  He has been seen by Dr. Posey Pronto who will take the patient for emergent dialysis.  I did discuss with the patient that he cannot come off of dialysis and live and that he cannot cure his end-stage renal disease with herbs or homeopathic medications.  He is unable to process this information at this time and is very involved in his thought process of believing that he has been misdiagnosed.  Patient is in no respiratory distress but did have an episode of oxygen desaturation.  He will be admitted by the hospitalist service  Final Clinical Impressions(s) / ED Diagnoses   Final diagnoses:  None    ED Discharge Orders    None       Margarita Mail, PA-C 04/03/18 1602    Carmin Muskrat, MD 04/04/18 2039

## 2018-04-03 NOTE — ED Notes (Signed)
Attempted report to 5W. 

## 2018-04-03 NOTE — ED Notes (Signed)
Nephrology at bedside

## 2018-04-03 NOTE — Progress Notes (Signed)
Patient's BP at 188/114, hydralazine given at shift change for BP at 228/121 and BP po meds.  I have text the MD to see if any orders can be added.  I will keep monitoring the patient.

## 2018-04-03 NOTE — ED Notes (Signed)
Pt's O2 was noticed to drop into the high 70's low 80's. RN notified. Pt put on NRB at this time on 4L.

## 2018-04-03 NOTE — ED Triage Notes (Signed)
Pt to ER states has not been in to dialysis in 2 weeks - reports productive cough that he believes is a cold. States he doesn't believe in dialysis, states "work out 4 hours a day and burn 4 kg to keep the fluid off." pt has swelling noted to face and extremities.

## 2018-04-03 NOTE — Consult Note (Signed)
Reason for Consult: Hyperkalemia, volume overload, uremia in patient with end-stage renal disease Referring Physician: Carmin Muskrat, MD (EDP)  HPI:  47 year old African-American man with past medical history significant for congestive heart failure, hypertension, coronary artery disease, seizure disorder, history of CVA and ESRD secondary to FSGS.  He has been on dialysis for an unclear duration and reports not going to dialysis for the last 2 weeks with previous history of spotty adherence.  He is motivated to get off of dialysis by using homeopathic/herbal remedies.  He does not believe that he has end-stage renal disease and requires chronic/maintenance hemodialysis and is frustrated by recent increase in time of his dialysis treatments.   He presented today with complaints of being nervous and upon prompting also reported some increasing lower extremity swelling and some jerking movements of his legs.  He denies any nausea or vomiting but reports that food tastes odd.  He has a right IJ tunneled hemodialysis catheter.  Past Medical History:  Diagnosis Date  . AAA (abdominal aortic aneurysm) ([place])   . Chronic anemia   . Chronic combined systolic and diastolic CHF (congestive heart failure) ([place])   . End stage renal disease ([place])   . ESRD on dialysis ([place])   . FSGS (focal segmental glomerulosclerosis)   . GERD (gastroesophageal reflux disease)   . HLD (hyperlipidemia)   . Hypertension   . NSTEMI (non-ST elevated myocardial infarction) ([place]) 07/2017   pt refused cath  . Pericardial effusion    a. small by echo 06/2017.  Marland Kitchen Renal disorder   . Seizure ([place])   . Stroke (cerebrum) [facility])     Past Surgical History:  Procedure Laterality Date  . ABDOMINAL AORTIC ANEURYSM REPAIR    . KIDNEY SURGERY      Family History  Problem Relation Age of Onset  . Hypertension Mother   . Kidney disease Mother   . Asthma Father   . Hypertension Father   . Kidney disease Father   . Hypertension  Sister   . Heart disease Sister   . Kidney disease Sister   . Heart disease Brother   . Hypertension Brother   . COPD Brother     Social History:  reports that he has never smoked. He has never used smokeless tobacco. He reports that he does not drink alcohol or use drugs.  Allergies:  Allergies  Allergen Reactions  . Clonidine Derivatives Swelling and Other (See Comments)    Leg swelling  . Hydralazine Hcl Swelling and Other (See Comments)    Leg swelling     Medications:  Scheduled: . calcium gluconate  1 g Intravenous Once  . [START ON 04/04/2018] Chlorhexidine Gluconate Cloth  6 each Topical Q0600  . dextrose  1 ampule Intravenous Once  . insulin aspart  10 Units Intravenous Once  . sodium bicarbonate  50 mEq Intravenous Once    BMP Latest Ref Rng & Units 04/03/2018 08/04/2017 07/30/2017  Glucose 70 - 99 mg/dL 114(H) 119(H) 145(H)  BUN 6 - 20 mg/dL 176(H) 113(H) 132(H)  Creatinine 0.61 - 1.24 mg/dL 26.32(H) 18.15(H) 19.03(H)  Sodium 135 - 145 mmol/L 141 141 139  Potassium 3.5 - 5.1 mmol/L 6.4(HH) 4.8 5.7(H)  Chloride 98 - 111 mmol/L 96(L) 102 104  CO2 22 - 32 mmol/L 17(L) 20(L) 17(L)  Calcium 8.9 - 10.3 mg/dL 9.9 9.9 9.1   CBC Latest Ref Rng & Units 04/03/2018 08/04/2017 07/30/2017  WBC 4.0 - 10.5 K/uL 8.4 7.6 6.3  Hemoglobin 13.0 - 17.0  g/dL 8.5(L) 9.1(L) 8.1(L)  Hematocrit 39.0 - 52.0 % 26.5(L) 28.5(L) 24.3(L)  Platelets 150 - 400 K/uL 271 235 135(L)     Dg Chest 2 View  Result Date: 04/03/2018 CLINICAL DATA:  has not been in to dialysis in 2 weeks - reports ''productive cough'' pt unable to keep arms up over head for lateral view. EXAM: CHEST - 2 VIEW COMPARISON:  None. FINDINGS: Dialysis catheter distal tip: Right atrium. Moderate cardiomegaly with bilateral indistinctly marginated perihilar densities favoring mild airspace edema. Faint Kerley B lines. Slight thickening of the fissures and mild blunting of the posterior costophrenic angles. IMPRESSION: 1. Moderate  cardiomegaly with indistinct perihilar airspace opacities compatible with moderate acute pulmonary edema. 2. Small bilateral pleural effusions. Electronically Signed   By: Van Clines M.D.   On: 04/03/2018 12:31    Review of Systems  Constitutional: Positive for malaise/fatigue. Negative for chills and fever.  HENT: Negative.   Eyes: Positive for redness. Negative for blurred vision and double vision.  Respiratory: Negative.   Cardiovascular: Positive for leg swelling. Negative for chest pain, palpitations and orthopnea.  Gastrointestinal: Negative for diarrhea, heartburn, nausea and vomiting.  Genitourinary: Negative.   Musculoskeletal: Negative.   Skin: Negative.   Neurological: Positive for tremors.  Psychiatric/Behavioral: The patient is nervous/anxious.    Blood pressure (!) 228/134, pulse 84, temperature 97.7 F (36.5 C), temperature source Oral, resp. rate 15, SpO2 98 %. Physical Exam  Nursing note and vitals reviewed. Constitutional: He is oriented to person, place, and time. He appears well-developed and well-nourished. No distress.  HENT:  Head: Normocephalic and atraumatic.  Right Ear: External ear normal.  Mouth/Throat: Oropharynx is clear and moist.  Eyes: Pupils are equal, round, and reactive to light. No scleral icterus.  Conjunctival ecchymosis  Neck: Normal range of motion. Neck supple. JVD present.  Jugular venous distention to angle of mandible  Cardiovascular: Normal rate, regular rhythm and intact distal pulses.  No murmur heard. Respiratory: Effort normal. He has no wheezes. He has rales.  Right IJ TDC  GI: Soft. He exhibits no distension. There is no tenderness. There is no rebound.  Musculoskeletal: He exhibits edema.  2+ lower extremity edema  Neurological: He is oriented to person, place, and time.  Somnolent but awakens easily to conversation  Skin: Skin is warm and dry. No erythema.    Assessment/Plan: 1.  Hyperkalemia: Secondary to missed  hemodialysis treatments, plan to undertake urgent hemodialysis today. 2.  Volume overload/hypertensive urgency: Significant volume excess noted on physical exam, undertake hemodialysis with ultrafiltration. 3.  Uremia: With asterixis/myoclonic jerks and changes in mentation likely ascribable to uremia.  The plan to undertake hemodialysis today-short-term treatment with low blood flows to limit any disequilibrium. 4.  Anemia: Secondary to chronic kidney disease and poor adherence with dialysis/ESA therapy.  Will check iron studies and decide management accordingly. 5.  Secondary hyperparathyroidism: We will add on a phosphorus level to labs from earlier today and decide on need for phosphorus binder.  Will obtain records from his outpatient kidney center to decide on need for vitamin D receptor analog versus Sensipar. 6.  Disposition: Needs extensive education and possible psychiatric evaluation to address compliance concerns.  He appears to be having significant paranoia and denial.  Shardae Kleinman K. 04/03/2018, 1:15 PM

## 2018-04-03 NOTE — Progress Notes (Addendum)
1745 Pt arrived via stretcher from dialysis. Pt lethargic, arouses to constant voice, sternal rubbing. Pt A&Ox4, denies pain, SOB. Pt oriented to room, VS taken, BP high, MD paged for orders. Pharmacy called to get home meds approved so RN could give to patient. Awaiting new orders from MD.   1800 Meds approved on MAR and pt medicated per MAR. Pt assessed, see flow sheet. Pt's room phone ringing, RN updated pt's mother on phone. Pt still lethargic, lab here to draw blood. WCTM.

## 2018-04-04 LAB — COMPREHENSIVE METABOLIC PANEL
ALT: 16 U/L (ref 0–44)
AST: 21 U/L (ref 15–41)
Albumin: 3.2 g/dL — ABNORMAL LOW (ref 3.5–5.0)
Alkaline Phosphatase: 76 U/L (ref 38–126)
Anion gap: 18 — ABNORMAL HIGH (ref 5–15)
BUN: 89 mg/dL — ABNORMAL HIGH (ref 6–20)
CO2: 24 mmol/L (ref 22–32)
Calcium: 9.1 mg/dL (ref 8.9–10.3)
Chloride: 97 mmol/L — ABNORMAL LOW (ref 98–111)
Creatinine, Ser: 18.56 mg/dL — ABNORMAL HIGH (ref 0.61–1.24)
GFR calc Af Amer: 3 mL/min — ABNORMAL LOW (ref >60)
GFR calc non Af Amer: 3 mL/min — ABNORMAL LOW (ref >60)
Glucose, Bld: 96 mg/dL (ref 70–99)
Potassium: 5.2 mmol/L — ABNORMAL HIGH (ref 3.5–5.1)
Sodium: 139 mmol/L (ref 135–145)
Total Bilirubin: 1 mg/dL (ref 0.3–1.2)
Total Protein: 6.3 g/dL — ABNORMAL LOW (ref 6.5–8.1)

## 2018-04-04 LAB — CBC
HCT: 24.6 % — ABNORMAL LOW (ref 39.0–52.0)
Hemoglobin: 8 g/dL — ABNORMAL LOW (ref 13.0–17.0)
MCH: 27.4 pg (ref 26.0–34.0)
MCHC: 32.5 g/dL (ref 30.0–36.0)
MCV: 84.2 fL (ref 78.0–100.0)
Platelets: 222 10*3/uL (ref 150–400)
RBC: 2.92 MIL/uL — ABNORMAL LOW (ref 4.22–5.81)
RDW: 18.3 % — ABNORMAL HIGH (ref 11.5–15.5)
WBC: 10.7 10*3/uL — ABNORMAL HIGH (ref 4.0–10.5)

## 2018-04-04 LAB — PHOSPHORUS: Phosphorus: 6.6 mg/dL — ABNORMAL HIGH (ref 2.5–4.6)

## 2018-04-04 LAB — HEPATITIS B SURFACE ANTIGEN: Hepatitis B Surface Ag: NEGATIVE

## 2018-04-04 LAB — HEPATITIS B SURFACE ANTIBODY,QUALITATIVE: Hep B S Ab: REACTIVE

## 2018-04-04 MED ORDER — ONDANSETRON HCL 4 MG/2ML IJ SOLN
4.0000 mg | Freq: Four times a day (QID) | INTRAMUSCULAR | Status: DC | PRN
  Administered 2018-04-04: 4 mg via INTRAVENOUS
  Filled 2018-04-04: qty 2

## 2018-04-04 MED ORDER — DARBEPOETIN ALFA 60 MCG/0.3ML IJ SOSY
60.0000 ug | PREFILLED_SYRINGE | INTRAMUSCULAR | Status: DC
  Filled 2018-04-04: qty 0.3

## 2018-04-04 MED ORDER — HEPARIN SODIUM (PORCINE) 1000 UNIT/ML DIALYSIS: 40.0000 [IU]/kg | INTRAMUSCULAR | Status: DC | PRN

## 2018-04-04 MED ORDER — ACETAMINOPHEN 325 MG PO TABS
650.0000 mg | ORAL_TABLET | Freq: Four times a day (QID) | ORAL | Status: DC | PRN
  Administered 2018-04-04 – 2018-04-05 (×2): 650 mg via ORAL
  Filled 2018-04-04: qty 2

## 2018-04-04 NOTE — Progress Notes (Signed)
Patient ID: Angel [name], male   DOB: [birth year], 47 y.o.   MRN: [id number]  [facility] Progress Note   Assessment/ Plan:   1.  Hyperkalemia: Secondary to missed hemodialysis treatments, treated acutely with emergent hemodialysis yesterday and the plan is for hemodialysis again today-mild hyperkalemia of 5.2 today. 2.  Volume overload/hypertensive urgency: Significant volume excess noted on physical exam, the plan is to continue daily dialysis for clearance and attempt to obtain dry weight.  Unclear if he should continue furosemide based on charted input/output. 3.  Uremia:  Symptoms somewhat better with hemodialysis this morning with improving mentation/level of consciousness. 4.  Anemia: Significantly low iron saturation noted on labs yesterday however ferritin levels prohibitive to intravenous iron.  We will start him on Aranesp with dialysis tomorrow preferably with better blood pressure control. 5.  Secondary hyperparathyroidism: Hyperphosphatemia noted on labs, monitor with consequent hemodialysis treatments to decide on need for binders.  Continue calcitriol for PTH suppression.. 6.  Disposition: Continue hemodialysis daily at this time with continued education regarding compliance.   Subjective:   Reports to be feeling somewhat better this morning with decreased facial swelling.  More accepting of the fact that he needs chronic hemodialysis.   Objective:   BP (!) 166/101 (BP Location: Left Arm)   Pulse 85   Temp 98.6 F (37 C) (Oral)   Resp 12   Ht 5\' 5"  (1.651 m)   Wt 66.8 kg   SpO2 100%   BMI 24.51 kg/m   Physical Exam: Gen: Comfortably resting in bed CVS: Pulse regular rhythm, normal rate, S1 and S2 normal Resp: Diminished breath sounds over bases, no distinct rales or rhonchi Abd: Soft, obese, nontender Ext: 2+ anasarca  Labs: BMET Recent Labs  Lab 04/03/18 1133 04/03/18 1811 04/04/18 0147  NA 141 140 139  K 6.4* 3.8 5.2*  CL 96* 97* 97*   CO2 17* 23 24  GLUCOSE 114* 71 96  BUN 176* 83* 89*  CREATININE 26.32* 15.81* 18.56*  CALCIUM 9.9 9.3 9.1  PHOS  --  5.0* 6.6*   CBC Recent Labs  Lab 04/03/18 1133 04/04/18 0147  WBC 8.4 10.7*  HGB 8.5* 8.0*  HCT 26.5* 24.6*  MCV 85.2 84.2  PLT 271 222   Medications:    . amLODipine  10 mg Oral Daily  . aspirin EC  81 mg Oral Daily  . atorvastatin  40 mg Oral QHS  . calcitRIOL  0.5 mcg Oral Daily  . carvedilol  25 mg Oral BID WC  . Chlorhexidine Gluconate Cloth  6 each Topical Q0600  . cholecalciferol  1,000 Units Oral BID  . furosemide  80 mg Oral QHS  . heparin  5,000 Units Subcutaneous Q8H  . hydrALAZINE  100 mg Oral TID  . losartan  50 mg Oral QPM  . multivitamin  1 tablet Oral BID  . pantoprazole  40 mg Oral Daily   Elmarie Shiley, MD 04/04/2018, 11:22 AM

## 2018-04-04 NOTE — Progress Notes (Signed)
Patient still having some lethargy, able to awake when spoken too but goes back to sleep.  Patient is communicating more than at the beginning of shift.  I was able to give him some bites of his dinner and provided fluids.  He was able to more his arms a bit, but he is still having some problems with upper extremities movements.  He was able to express what he was feeling such as pain in his back, provided heat pack.  At this time, patient asleep.  I will keep monitoring the patient.

## 2018-04-04 NOTE — Plan of Care (Signed)
  Problem: Clinical Measurements: Goal: Diagnostic test results will improve Outcome: Progressing   Problem: Activity: Goal: Risk for activity intolerance will decrease Outcome: Progressing   

## 2018-04-04 NOTE — Progress Notes (Signed)
PROGRESS NOTE    Tehran Rabenold Poke  SWN:[id number] DOB: 1970/10/05 DOA: 04/03/2018 PCP: Gwenlyn Saran, MD  Outpatient Specialists:     Brief Narrative:  Quenten Nawaz is a 47 y.o. 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 He stopped going to HD 2 weeks ago because he wants to get "a more professional outlook on the process; to get another outlook from another doctor." He was diagnosed with ESRD approximately 2 years ago. He presented to the ED today with his mother with c/o 3-4 days of worsening tremors and weakness. He has also had a nonproductive cough for about a week. No fevers/chills, denies dyspnea, chest pain, HA, blurry vision. Has had some increased LE edema. He does make urine, has been urinating per usual. He stopped taking all of his medications 2 weeks ago, saying "they were not working; they were elevating my blood pressure." Specifically he thinks EPO was raising his BP and therefore he got "fed up" with the doctors and decided to stop taking all of his medications.   On presentation to the hospital, the patient's blood pressure was 225/140, bed 9 to 166/101 mmHg.  Potassium is 5.2.  BUN of 8 9 with serum creatinine of 18.53.  The patient had first hemodialysis yesterday.  For repeat hemodialysis today.  Patient is a poor historian.  Assessment & Plan:   Principal Problem:   Hyperkalemia Active Problems:   HLD (hyperlipidemia)   Stroke (cerebrum) ([name])   ESRD (end stage renal disease) on dialysis ([name])   Hypertensive urgency   Fluid overload   Anemia in ESRD (end-stage renal disease) ([name])   Hypertensive emergency   End-stage renal disease needing dialysis ([name])   Metabolic acidosis, increased anion gap   Uremia   Dialysis patient, noncompliant ([name])   Chronic combined systolic  and diastolic CHF (congestive heart failure) ([name])   FSGS (focal segmental glomerulosclerosis)   Uremia, hyperkalemia, elevated anion gap metabolic acidosis, hypertensive urgency and fluid overload in the setting of ESRD and 2 weeks of missed HD -nephrology is on board; pt is on his way to HD now -uremia most likely causing his tremor and encephalopathy -elevated anion gap due to uremia -hyperkalemia: s/p calcium gluconate, insulin/D50, bicarb.  -repeat BMP after HD and in a.m. 2018-04-14: Continue hemodialysis as per nephrology team.  Chronic systolic and diastolic CHF with volume overload -HD today with UF -daily weights, fluid restriction, low sodium, renal diet 2018/04/14: This will likely resolve with hemodialysis.  Anemia 2/2 ESRD -f/u iron studies, renal recs -CBC in a.m. 04/14/2018: We will defer to nephrology team.  HTN: uncontrolled, noncompliant with his medications -resume home antihypertensives: norvasc, carvedilol, hydralazine, losartan, clonidine, lasix 04-14-18: This is slowly resolving.  CAD  -cont ASA, statin  Secondary hyperparathyroidism -f/u P level, then decide on whether to start P binder -outpatient records requested; renal to decide whether pt needs calcitriol vs sensipar  Noncompliance: this appears to be persistent and recurrent. -pt has been counseled extensively today; unclear how much he retained given his mental status    DVT prophylaxis: Heparin Code Status:  Full - confirmed with patient/family Family Communication:  Disposition Plan:  Home once clinically improved Consults called: Nephrology    Procedures:   Hemodialysis  Antimicrobials:   None   Subjective: Remains mildly encephalopathic. Poor historian.  Objective:  Vitals:   04/04/18 1530 04/04/18 1600 04/04/18 1630 04/04/18 1700  BP: (!) 177/100 (!) 176/94 (!) 133/97 (!) 182/115  Pulse: 83 81 77 84  Resp:      Temp:      TempSrc:      SpO2:      Weight:        Height:        Intake/Output Summary (Last 24 hours) at 04/04/2018 1714 Last data filed at 04/04/2018 1400 Gross per 24 hour  Intake 632 ml  Output -  Net 632 ml   Filed Weights   04/03/18 1753 04/04/18 0458 04/04/18 1400  Weight: 64.5 kg 66.8 kg 65.2 kg    Examination:  General exam: Appears calm and comfortable, but mildly encephalopathic. Respiratory system: Decreased air entry both lungs posteriorly with crackles.   Cardiovascular system: S1 & S2 heard. Gastrointestinal system: Abdomen is nondistended, soft and nontender. No organomegaly or masses felt. Normal bowel sounds heard. Central nervous system: Alert and oriented. No focal neurological deficits. Extremities: Mild edema persists.    Data Reviewed: I have personally reviewed following labs and imaging studies  CBC: Recent Labs  Lab 04/03/18 1133 04/04/18 0147  WBC 8.4 10.7*  HGB 8.5* 8.0*  HCT 26.5* 24.6*  MCV 85.2 84.2  PLT 271 956   Basic Metabolic Panel: Recent Labs  Lab 04/03/18 1133 04/03/18 1811 04/04/18 0147  NA 141 140 139  K 6.4* 3.8 5.2*  CL 96* 97* 97*  CO2 17* 23 24  GLUCOSE 114* 71 96  BUN 176* 83* 89*  CREATININE 26.32* 15.81* 18.56*  CALCIUM 9.9 9.3 9.1  PHOS  --  5.0* 6.6*   GFR: Estimated Creatinine Clearance: 4.3 mL/min (A) (by C-G formula based on SCr of 18.56 mg/dL (H)). Liver Function Tests: Recent Labs  Lab 04/04/18 0147  AST 21  ALT 16  ALKPHOS 76  BILITOT 1.0  PROT 6.3*  ALBUMIN 3.2*   No results for input(s): LIPASE, AMYLASE in the last 168 hours. No results for input(s): AMMONIA in the last 168 hours. Coagulation Profile: No results for input(s): INR, PROTIME in the last 168 hours. Cardiac Enzymes: No results for input(s): CKTOTAL, CKMB, CKMBINDEX, TROPONINI in the last 168 hours. BNP (last 3 results) No results for input(s): PROBNP in the last 8760 hours. HbA1C: No results for input(s): HGBA1C in the last 72 hours. CBG: Recent Labs  Lab 04/03/18 2229   GLUCAP 90   Lipid Profile: No results for input(s): CHOL, HDL, LDLCALC, TRIG, CHOLHDL, LDLDIRECT in the last 72 hours. Thyroid Function Tests: No results for input(s): TSH, T4TOTAL, FREET4, T3FREE, THYROIDAB in the last 72 hours. Anemia Panel: Recent Labs    04/03/18 1445  FERRITIN 1,690*  TIBC 237*  IRON 33*   Urine analysis:    Component Value Date/Time   COLORURINE YELLOW (A) 01/04/2018 1030   APPEARANCEUR CLEAR (A) 01/04/2018 1030   LABSPEC 1.004 (L) 01/04/2018 1030   PHURINE 9.0 (H) 01/04/2018 1030   GLUCOSEU NEGATIVE 01/04/2018 1030   HGBUR NEGATIVE 01/04/2018 1030   BILIRUBINUR NEGATIVE 01/04/2018 1030   KETONESUR NEGATIVE 01/04/2018 1030   PROTEINUR 100 (A) 01/04/2018 1030   NITRITE NEGATIVE 01/04/2018 1030   LEUKOCYTESUR NEGATIVE 01/04/2018 1030   Sepsis Labs: @LABRCNTIP (procalcitonin:4,lacticidven:4)  )No results found for this or any previous visit (from the past 240 hour(s)).       Radiology Studies: Dg Chest 2 View  Result Date: 04/03/2018 CLINICAL DATA:  has not been in to dialysis in  2 weeks - reports ''productive cough'' pt unable to keep arms up over head for lateral view. EXAM: CHEST - 2 VIEW COMPARISON:  None. FINDINGS: Dialysis catheter distal tip: Right atrium. Moderate cardiomegaly with bilateral indistinctly marginated perihilar densities favoring mild airspace edema. Faint Kerley B lines. Slight thickening of the fissures and mild blunting of the posterior costophrenic angles. IMPRESSION: 1. Moderate cardiomegaly with indistinct perihilar airspace opacities compatible with moderate acute pulmonary edema. 2. Small bilateral pleural effusions. Electronically Signed   By: Van Clines M.D.   On: 04/03/2018 12:31        Scheduled Meds: . amLODipine  10 mg Oral Daily  . aspirin EC  81 mg Oral Daily  . atorvastatin  40 mg Oral QHS  . calcitRIOL  0.5 mcg Oral Daily  . carvedilol  25 mg Oral BID WC  . Chlorhexidine Gluconate Cloth  6 each  Topical Q0600  . cholecalciferol  1,000 Units Oral BID  . [START ON 04/05/2018] darbepoetin (ARANESP) injection - DIALYSIS  60 mcg Intravenous Q Fri-HD  . furosemide  80 mg Oral QHS  . heparin  5,000 Units Subcutaneous Q8H  . hydrALAZINE  100 mg Oral TID  . losartan  50 mg Oral QPM  . multivitamin  1 tablet Oral BID  . pantoprazole  40 mg Oral Daily   Continuous Infusions:   LOS: 1 day    Time spent: 35 minutes.    Dana Allan, MD  [facility] Pager #: [phone] 7PM-7AM contact night coverage as above

## 2018-04-05 ENCOUNTER — Inpatient Hospital Stay: Payer: Medicare Other

## 2018-04-05 DIAGNOSIS — E875 Hyperkalemia: Secondary | ICD-10-CM

## 2018-04-05 LAB — CBC
HCT: 28.1 % — ABNORMAL LOW (ref 39.0–52.0)
Hemoglobin: 8.9 g/dL — ABNORMAL LOW (ref 13.0–17.0)
MCH: 27.2 pg (ref 26.0–34.0)
MCHC: 31.7 g/dL (ref 30.0–36.0)
MCV: 85.9 fL (ref 78.0–100.0)
PLATELETS: 248 10*3/uL (ref 150–400)
RBC: 3.27 MIL/uL — AB (ref 4.22–5.81)
RDW: 17.9 % — ABNORMAL HIGH (ref 11.5–15.5)
WBC: 7.1 10*3/uL (ref 4.0–10.5)

## 2018-04-05 LAB — RENAL FUNCTION PANEL
ANION GAP: 13 (ref 5–15)
Albumin: 3.3 g/dL — ABNORMAL LOW (ref 3.5–5.0)
BUN: 31 mg/dL — ABNORMAL HIGH (ref 6–20)
CALCIUM: 9.6 mg/dL (ref 8.9–10.3)
CO2: 29 mmol/L (ref 22–32)
Chloride: 95 mmol/L — ABNORMAL LOW (ref 98–111)
Creatinine, Ser: 9.94 mg/dL — ABNORMAL HIGH (ref 0.61–1.24)
GFR calc non Af Amer: 5 mL/min — ABNORMAL LOW (ref >60)
GFR, EST AFRICAN AMERICAN: 6 mL/min — AB (ref >60)
GLUCOSE: 111 mg/dL — AB (ref 70–99)
POTASSIUM: 4.1 mmol/L (ref 3.5–5.1)
Phosphorus: 6.7 mg/dL — ABNORMAL HIGH (ref 2.5–4.6)
SODIUM: 137 mmol/L (ref 135–145)

## 2018-04-05 MED ORDER — HEPARIN SODIUM (PORCINE) 1000 UNIT/ML DIALYSIS
40.0000 [IU]/kg | INTRAMUSCULAR | Status: DC | PRN
  Administered 2018-04-05: 2700 [IU] via INTRAVENOUS_CENTRAL
  Filled 2018-04-05 (×3): qty 3

## 2018-04-05 MED ORDER — ACETAMINOPHEN 325 MG PO TABS
ORAL_TABLET | ORAL | Status: AC
  Filled 2018-04-05: qty 2

## 2018-04-05 MED ORDER — ZOLPIDEM TARTRATE 5 MG PO TABS
5.0000 mg | ORAL_TABLET | Freq: Every evening | ORAL | Status: DC | PRN
  Administered 2018-04-05: 5 mg via ORAL
  Filled 2018-04-05: qty 1

## 2018-04-05 NOTE — Progress Notes (Signed)
Patient received Lasix 80 mg po on 04/04/2018 at 21:07, but did not void.

## 2018-04-05 NOTE — Progress Notes (Signed)
Pt requested to see provider. Pt states he will leave AMA if not d/c today. Dr. Thereasa Solo notified. Will continue to monitor.

## 2018-04-05 NOTE — Progress Notes (Signed)
Pt SBP 185. Pt still insists on leaving without seeing provider. Pt educated and d/c information given.

## 2018-04-05 NOTE — Procedures (Signed)
Patient seen on Hemodialysis. QB 400, UF goal 2.5L Concerned about UF goal/EDW.  Elmarie Shiley MD [facility]. Office # [phone] Pager # [phone] 9:26 AM

## 2018-04-05 NOTE — Discharge Instructions (Signed)
You are being discharged home prior to being seen by your hospital physician at your own insistence.  You MUST comply with ALL of your medical treatment, your renal diet, and EACH AND EVERY HEMODIALYSIS TREATMENT or you WILL end up in the hospital again, or you may even DIE.  Your compliance with your treatment plan is of CRITICAL IMPORTANCE.     Dialysis Dialysis is a procedure that replaces some of the work healthy kidneys do. It is done when you lose about 85-90% of your kidney function. It may also be done earlier if your symptoms may be improved by dialysis. During dialysis, wastes, salt, and extra water are removed from the blood, and the levels of certain chemicals in the blood (such as potassium) are maintained. Dialysis is done in sessions. Dialysis sessions are continued until the kidneys get better. If the kidneys cannot get better, such as in end-stage kidney disease, dialysis is continued for life or until you receive a new kidney (kidney transplant). There are two types of dialysis: hemodialysis and peritoneal dialysis. What is hemodialysis? Hemodialysis is a type of dialysis in which a machine called a dialyzer is used to filter the blood. Before beginning hemodialysis, you will have surgery to create a site where blood can be removed from the body and returned to the body (vascular access). There are three types of vascular accesses:  Arteriovenous fistula. To create this type of access, an artery is connected to a vein (usually in the arm). A fistula takes 1-6 months to develop after surgery. If it develops properly, it usually lasts longer than the other types of vascular accesses. It is also less likely to become infected and cause blood clots.  Arteriovenous graft. To create this type of access, an artery and a vein in the arm are connected with a tube. A graft may be used within 2-3 weeks of surgery.  A venous catheter. To create this type of access, a thin, flexible tube (catheter)  is placed in a large vein in your neck, chest, or groin. A catheter may be used right away. It is usually used as a temporary access when dialysis needs to begin immediately.  During hemodialysis, blood leaves the body through your access. It travels through a tube to the dialyzer, where it is filtered. The blood then returns to your body through another tube. Hemodialysis is usually performed by a health care provider at a hospital or dialysis center three times a week. Visits last about 3-4 hours. It may also be performed with the help of another person at home with training. What is peritoneal dialysis? Peritoneal dialysis is a type of dialysis in which the thin lining of the abdomen (peritoneum) is used as a filter. Before beginning peritoneal dialysis, you will have surgery to place a catheter in your abdomen. The catheter will be used to transfer a fluid called dialysate to and from your abdomen. At the start of a session, your abdomen is filled with dialysate. During the session, wastes, salt, and extra water in the blood pass through the peritoneum and into the dialysate. The dialysate is drained from the body at the end of the session. The process of filling and draining the dialysate is called an exchange. Exchanges are repeated until you have used up all the dialysate for the day. Peritoneal dialysis may be performed by you at home or at almost any other location. It is done every day. You may need up to five exchanges a day. The  amount of time the dialysate is in your body between exchanges is called a dwell. The dwell depends on the number of exchanges needed and the characteristics of the peritoneum. It usually varies from 1.5-3 hours. You may go about your day normally between exchanges. Alternately, the exchanges may be done at night while you sleep, using a machine called a cycler. Which type of dialysis should I choose? Both hemodialysis and peritoneal dialysis have advantages and  disadvantages. Talk to your health care provider about which type of dialysis would be best for you. Your lifestyle and preferences should be considered along with your medical condition. In some cases, only one type of dialysis may be an option. Advantages of hemodialysis  It is done less often than peritoneal dialysis.  Someone else can do the dialysis for you.  If you go to a dialysis center, your health care provider will be able to recognize any problems right away.  If you go to a dialysis center, you can interact with others who are having dialysis. This can provide you with emotional support.  Disadvantages of hemodialysis  Hemodialysis may cause cramps and low blood pressure. It may leave you feeling tired on the days you have the treatment.  If you go to a dialysis center, you will need to make weekly appointments and work around the centers schedule.  You will need to take extra care when traveling. If you go to a dialysis center, you will need to make special arrangements to visit a dialysis center near your destination. If you are having treatments at home, you will need to take the dialyzer with you to your destination.  You will need to avoid more foods than you would need to avoid on peritoneal dialysis.  Advantages of peritoneal dialysis  It is less likely than hemodialysis to cause cramps and low blood pressure.  You may do exchanges on your own wherever you are, including when you travel.  You do not need to avoid as many foods as you do on hemodialysis.  Disadvantages of peritoneal dialysis  It is done more often than hemodialysis.  Performing peritoneal dialysis requires you to have dexterity of the hands. You must also be able to lift bags.  You will have to learn sterilization techniques. You will need to practice them every day to reduce the risk of infection.  What changes will I need to make to my diet during dialysis? Both hemodialysis and peritoneal  dialysis require you to make some changes to your diet. For example, you will need to limit your intake of foods high in the minerals phosphorus and potassium. You will also need to limit your fluid intake. Your dietitian can help you plan meals. A good meal plan can improve your dialysis and your health. What should I expect when beginning dialysis? Adjusting to the dialysis treatment, schedule, and diet can take some time. You may need to stop working and may not be able to do some of the things you normally do. You may feel anxious or depressed when beginning dialysis. Eventually, many people feel better overall because of dialysis. Some people are able to return to work after making some changes, such as reducing work intensity. Where can I find more information?  [place]: www.kidney.org  American Association of Kidney Patients: BombTimer.gl  American [employer]: www.kidneyfund.org This information is not intended to replace advice given to you by your health care provider. Make sure you discuss any questions you have with your  health care provider. Document Released: 10/14/2002 Document Revised: 12/30/2015 Document Reviewed: 09/17/2012 Elsevier Interactive Patient Education  2017 [employer].

## 2018-04-05 NOTE — Discharge Summary (Addendum)
DISCHARGE SUMMARY  Angel [name]  MR#: [id number]  DOB:Sep 20, 1970  Date of Admission: 04/03/2018 Date of Discharge: 04/05/2018  Attending Physician:Camil Hausmann Hennie Duos, MD  Patient's BZJ:[name], Angel Jetty, MD  Consults:  Nephrology   Disposition: D/C home at insistence of patient prior to MD visit  Follow-up Appts: Follow-up Information    Angel Saran, MD Follow up in 3 day(s).   Specialty:  Family Medicine Contact information: [address] [place] [address] [phone]        Angel Blanks, MD .   Specialty:  Cardiology Contact information: [place] [place] [address] [phone]          Discharge Diagnoses: Hyperkalemia Volume overload Hypertensive urgency Uremia w/ encephalopathy due to uremia  Anemia of CKD Secondary hyperparathyroidism  Initial presentation: As per Dr. Silverio [name] admit note: "47 y.o. 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 He stopped going to HD 2 weeks ago because he wants to get "a more professional outlook on the process; to get another outlook from another doctor." He was diagnosed with ESRD approximately 2 years ago. He presented to the ED today with his mother with c/o 3-4 days of worsening tremors and weakness. He has also had a nonproductive cough for about a week. No fevers/chills, denies dyspnea, chest pain, HA, blurry vision. Has had some increased LE edema. He does make urine, has been urinating per usual. He stopped taking all of his medications 2 weeks ago, saying "they were not working; they were elevating my blood pressure." Specifically he thinks EPO was raising his BP and therefore he got "fed up" with the doctors and decided to stop taking all of his medications. The history is felt not  reliable due to patient's somnolence. His mother is in the room but she is unable to provide additional history. He states his dry weight is 130 kg."   Hospital Course:  All active issues were addressed by the Nephrology team during this patient's hospital stay.  On 8/30, after HD, the pt reported to his RN that he was ready to go home.  He did not wish to wait for his medical MD to visit him, and reported he would leave AMA if he was not d/c right away.  In that the pt had been cared for by Nephrology, who had seen him on the day of d/c and reported that he was ready for d/c, it was not felt necessary to require him to wait in the hospital until his Hospitalist MD (who was busy w/ a critical patient) could see him for d/c.  As a result, orders were entered for the pt to be d/c home w/o a visit from the [facility] MD.   The active issue which Nephrology addressed during this hospital stay were as follows: (as per Dr. Elmarie [name])  1.  Hyperkalemia: Secondary to missed hemodialysis treatments, continue daily dialysis at this time with his third treatment being today-based on clinical status/labs, may potentially be able to be discharged home today to resume outpatient dialysis unit at his home unit in [place]. 2. Volume overload/hypertensive urgency: Volume status improved with ultrafiltration/hemodialysis and anticipate gradual lowering of dry weight as an outpatient. 3. Uremia: Symptoms improved with hemodialysis-compliance stressed. 4. Anemia: Low iron saturation but prohibitively high ferritin limiting intravenous  iron therapy-continue Aranesp. 5. Secondary hyperparathyroidism: Hyperphosphatemia noted on labs, monitor with consequent hemodialysis treatments to decide on need for binders.  Continue calcitriol for PTH suppression  Allergies as of 04/05/2018      Reactions   Clonidine Derivatives Swelling, Other (See Comments)   Leg swelling   Hydralazine Hcl Swelling, Other (See Comments)   Leg  swelling       Medication List    TAKE these medications   amLODipine 10 MG tablet Commonly known as:  NORVASC Take 10 mg by mouth daily.   aspirin EC 81 MG tablet Take 81 mg daily by mouth.   atorvastatin 40 MG tablet Commonly known as:  LIPITOR Take 40 mg at bedtime by mouth.   calcitRIOL 0.5 MCG capsule Commonly known as:  ROCALTROL Take by mouth. Take 1 capsule by mouth every day   carvedilol 25 MG tablet Commonly known as:  COREG Take 25 mg 2 (two) times daily with a meal by mouth.   cholecalciferol 1000 units tablet Commonly known as:  VITAMIN D Take 1,000 Units by mouth 2 (two) times daily.   cloNIDine 0.2 MG tablet Commonly known as:  CATAPRES Take 0.2 mg by mouth 2 (two) times daily.   CULTURELLE Caps Take 1 capsule by mouth daily.   furosemide 80 MG tablet Commonly known as:  LASIX Take 80 mg at bedtime by mouth.   hydrALAZINE 100 MG tablet Commonly known as:  APRESOLINE Take 100 mg by mouth 3 (three) times daily.   hydrOXYzine 10 MG tablet Commonly known as:  ATARAX/VISTARIL 10 mg daily as needed for itching or anxiety. Take 1 tablet daily as needed   losartan 50 MG tablet Commonly known as:  COZAAR Take 50 mg by mouth every evening.   multivitamin Tabs tablet Take 1 tablet by mouth 2 (two) times daily.   OMEGA III EPA+DHA 1000 MG Caps   pantoprazole 40 MG tablet Commonly known as:  PROTONIX TAKE 1 TABLET BY MOUTH EVERY DAY   sevelamer carbonate 800 MG tablet Commonly known as:  RENVELA Take 1,600 mg by mouth See admin instructions. Take 2 tablets (1600 mg) three times daily with meals and with snacks   WHITE PETROLATUM-MINERAL OIL OP Apply to incision twice a day PRN itching       Day of Discharge BP (!) 187/117   Pulse 81   Temp 98.8 F (37.1 C) (Oral)   Resp 19   Ht 5\' 5"  (1.651 m)   Wt 58.8 kg Comment: stand up scale  SpO2 99%   BMI 21.57 kg/m   Physical Exam: Pt refused to wait to be examined by this MD.    Basic  Metabolic Panel: Recent Labs  Lab 04/03/18 1133 04/03/18 1811 04/04/18 0147 04/05/18 0900  NA 141 140 139 137  K 6.4* 3.8 5.2* 4.1  CL 96* 97* 97* 95*  CO2 17* 23 24 29   GLUCOSE 114* 71 96 111*  BUN 176* 83* 89* 31*  CREATININE 26.32* 15.81* 18.56* 9.94*  CALCIUM 9.9 9.3 9.1 9.6  PHOS  --  5.0* 6.6* 6.7*    Liver Function Tests: Recent Labs  Lab 04/04/18 0147 04/05/18 0900  AST 21  --   ALT 16  --   ALKPHOS 76  --   BILITOT 1.0  --   PROT 6.3*  --   ALBUMIN 3.2* 3.3*    CBC: Recent Labs  Lab 04/03/18 1133 04/04/18 0147 04/05/18 0900  WBC 8.4 10.7* 7.1  HGB 8.5*  8.0* 8.9*  HCT 26.5* 24.6* 28.1*  MCV 85.2 84.2 85.9  PLT 271 222 248    CBG: Recent Labs  Lab 04/03/18 2229  GLUCAP 90     Time spent in discharge (includes decision making & examination of pt): NO CHARGE   04/05/2018, 7:07 PM   [name], MD [facility] Office  [phone] Pager [phone]  On-Call/Text Page:      [name]      password [facility]

## 2018-04-05 NOTE — Progress Notes (Signed)
Patient ID: Angel [name], male   DOB: [birth year], 47 y.o.   MRN: [id number]  [facility] Progress Note   Assessment/ Plan:   1.  Hyperkalemia: Secondary to missed hemodialysis treatments, continue daily dialysis at this time with his third treatment being today-based on clinical status/labs, may potentially be able to be discharged home today to resume outpatient dialysis unit at his home unit in [place]. 2.  Volume overload/hypertensive urgency: Volume status improved with ultrafiltration/hemodialysis and anticipate gradual lowering of dry weight as an outpatient. 3.  Uremia:  Symptoms improved with hemodialysis-compliance stressed. 4.  Anemia: Low iron saturation but prohibitively high ferritin limiting intravenous iron therapy-continue Aranesp. 5.  Secondary hyperparathyroidism: Hyperphosphatemia noted on labs, monitor with consequent hemodialysis treatments to decide on need for binders.  Continue calcitriol for PTH suppression. 6.  Disposition: Continue education regarding dialysis/compliance-may possibly be able to go home today.  Subjective:   Reports to be somewhat disappointed by aggressive lowering of his dry weight with dialysis.   Objective:   BP (!) 176/105 (BP Location: Right Arm)   Pulse 79   Temp 98.7 F (37.1 C) (Oral)   Resp 15   Ht 5\' 5"  (1.651 m)   Wt 62.4 kg   SpO2 91%   BMI 22.89 kg/m   Physical Exam: Gen: Comfortably resting in hemodialysis CVS: Pulse regular rhythm, normal rate, S1 and S2 normal Resp: Diminished breath sounds over bases, no distinct rales or rhonchi Abd: Soft, obese, nontender Ext: 1-2+ edema over lower extremities with some facial edema  Labs: BMET Recent Labs  Lab 04/03/18 1133 04/03/18 1811 04/04/18 0147  NA 141 140 139  K 6.4* 3.8 5.2*  CL 96* 97* 97*  CO2 17* 23 24  GLUCOSE 114* 71 96  BUN 176* 83* 89*  CREATININE 26.32* 15.81* 18.56*  CALCIUM 9.9 9.3 9.1  PHOS  --  5.0* 6.6*   CBC Recent Labs   Lab 04/03/18 1133 04/04/18 0147 04/05/18 0900  WBC 8.4 10.7* 7.1  HGB 8.5* 8.0* 8.9*  HCT 26.5* 24.6* 28.1*  MCV 85.2 84.2 85.9  PLT 271 222 248   Medications:    . amLODipine  10 mg Oral Daily  . aspirin EC  81 mg Oral Daily  . atorvastatin  40 mg Oral QHS  . calcitRIOL  0.5 mcg Oral Daily  . carvedilol  25 mg Oral BID WC  . Chlorhexidine Gluconate Cloth  6 each Topical Q0600  . cholecalciferol  1,000 Units Oral BID  . darbepoetin (ARANESP) injection - DIALYSIS  60 mcg Intravenous Q Fri-HD  . furosemide  80 mg Oral QHS  . heparin  5,000 Units Subcutaneous Q8H  . hydrALAZINE  100 mg Oral TID  . losartan  50 mg Oral QPM  . multivitamin  1 tablet Oral BID  . pantoprazole  40 mg Oral Daily   Elmarie Shiley, MD 04/05/2018, 9:57 AM

## 2018-04-05 NOTE — Progress Notes (Signed)
Attempted to return call to Pt's wife. I was unable to reach her, so I left a VM explaining that I was not able to give information over the phone. I encouraged her to call pt. directly with any questions she has in regard to possible discharge.

## 2018-04-19 ENCOUNTER — Inpatient Hospital Stay: Payer: Medicare Other | Attending: Oncology | Admitting: Oncology

## 2018-04-19 ENCOUNTER — Inpatient Hospital Stay: Payer: Medicare Other

## 2018-08-08 ENCOUNTER — Inpatient Hospital Stay: Payer: Medicare Other | Attending: Oncology | Admitting: Oncology

## 2018-08-08 ENCOUNTER — Other Ambulatory Visit: Payer: Self-pay

## 2018-08-08 ENCOUNTER — Encounter: Payer: Self-pay | Admitting: Oncology

## 2018-08-08 ENCOUNTER — Inpatient Hospital Stay: Payer: Medicare Other

## 2018-08-08 VITALS — BP 185/109 | HR 89 | Temp 96.9°F | Resp 18 | Wt 146.1 lb

## 2018-08-08 DIAGNOSIS — Z8673 Personal history of transient ischemic attack (TIA), and cerebral infarction without residual deficits: Secondary | ICD-10-CM | POA: Diagnosis not present

## 2018-08-08 DIAGNOSIS — Z79899 Other long term (current) drug therapy: Secondary | ICD-10-CM | POA: Diagnosis not present

## 2018-08-08 DIAGNOSIS — N186 End stage renal disease: Secondary | ICD-10-CM | POA: Diagnosis not present

## 2018-08-08 DIAGNOSIS — D631 Anemia in chronic kidney disease: Secondary | ICD-10-CM | POA: Insufficient documentation

## 2018-08-08 DIAGNOSIS — R21 Rash and other nonspecific skin eruption: Secondary | ICD-10-CM | POA: Diagnosis not present

## 2018-08-08 DIAGNOSIS — Z992 Dependence on renal dialysis: Secondary | ICD-10-CM | POA: Diagnosis not present

## 2018-08-08 DIAGNOSIS — I10 Essential (primary) hypertension: Secondary | ICD-10-CM

## 2018-08-08 DIAGNOSIS — I252 Old myocardial infarction: Secondary | ICD-10-CM | POA: Insufficient documentation

## 2018-08-08 DIAGNOSIS — R5383 Other fatigue: Secondary | ICD-10-CM | POA: Diagnosis not present

## 2018-08-08 DIAGNOSIS — I132 Hypertensive heart and chronic kidney disease with heart failure and with stage 5 chronic kidney disease, or end stage renal disease: Secondary | ICD-10-CM | POA: Insufficient documentation

## 2018-08-08 DIAGNOSIS — K219 Gastro-esophageal reflux disease without esophagitis: Secondary | ICD-10-CM | POA: Insufficient documentation

## 2018-08-08 DIAGNOSIS — Z7982 Long term (current) use of aspirin: Secondary | ICD-10-CM | POA: Insufficient documentation

## 2018-08-08 DIAGNOSIS — E785 Hyperlipidemia, unspecified: Secondary | ICD-10-CM | POA: Insufficient documentation

## 2018-08-08 DIAGNOSIS — I5042 Chronic combined systolic (congestive) and diastolic (congestive) heart failure: Secondary | ICD-10-CM | POA: Insufficient documentation

## 2018-08-08 NOTE — Progress Notes (Signed)
Patient here for follow up. Blood pressure is elevated, denies dizziness or headache. He states he took blood pressure and hour ago.

## 2018-08-08 NOTE — Progress Notes (Signed)
Hematology/Oncology Follow up note [facility] [phone] [phone]   Patient Care Team: Gwenlyn Saran, MD as PCP - General (Family Medicine) Burnell Blanks, MD as PCP - Cardiology (Cardiology)  REFERRING PROVIDER: Dr.Singh Sheilah Mins REASON FOR VISIT Follow up for treatment of anemia  HISTORY OF PRESENTING ILLNESS:  Angel [name] is a  48 y.o.  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  Patient reports that he has been getting Epogen with dialysis for about a year and a half.  Recently he has developed skin rash on buttock, also field questionable throat itching after he received Epogen injection.  Epogen was hold.  With further questioning, patient reports using new body wash during the same time period.  Patient has anemia of chronic kidney disease and was [facility] for [facility]. Patient reports feeling fatigue.  Denies any hematochezia or hematemesis, black tarry stool.  His rash improved after topical steroid cream  INTERVAL HISTORY Angel [name] is a 48 y.o. male who as above history reviewed by me today presents for follow-up visit for management of anemia secondary to chronic kidney disease.  Patient is on hemodialysis.  Has no showed multiple times.  Last Aranesp to 225 mcg was given on 03/22/2018. Patient gets his labs done prior to the visit.  He does not have peripheral IV access labs done on 08/02/2018 showed hematocrit of 17.3, hemoglobin 5.6, ferritin 2394, iron saturation 68, iron 122, TIBC 180 Alk phos 133, Patient told me that it was advised by primary care physician to go to emergency room for blood transfusion which he did not follow the advice.  He has his 2 kids visiting him for the holiday and he does not want to go to emergency room for blood transfusion.  Ports  extremely tired and fatigued.  Denies any chest pain or chest tightness. Denies hematochezia, hematuria, hematemesis, epistaxis, black tarry stool  His blood pressure in the clinic was 185/109.  Repeat blood pressure showed 169/104  Review of Systems  Constitutional: Positive for fatigue. Negative for appetite change, chills and fever.  HENT:   Negative for hearing loss and voice change.   Eyes: Negative for eye problems and icterus.  Respiratory: Positive for shortness of breath. Negative for chest tightness and cough.   Cardiovascular: Negative for chest pain and leg swelling.  Gastrointestinal: Negative for abdominal distention and abdominal pain.  Endocrine: Negative for hot flashes.  Genitourinary: Negative for difficulty urinating, dysuria and frequency.   Musculoskeletal: Negative for arthralgias.  Skin: Negative for itching and rash.  Neurological: Negative for light-headedness and numbness.  Hematological: Negative for adenopathy. Does not bruise/bleed easily.  Psychiatric/Behavioral: Negative for confusion.   MEDICAL HISTORY:  Past Medical History:  Diagnosis Date  . AAA (abdominal aortic aneurysm) ([place])   . Chronic anemia   . Chronic combined systolic and diastolic CHF (congestive heart failure) ([place])   . End stage renal disease ([place])   . ESRD on dialysis ([place])   . FSGS (focal segmental glomerulosclerosis)   . GERD (gastroesophageal reflux disease)   . HLD (hyperlipidemia)   . Hypertension   . NSTEMI (non-ST elevated myocardial infarction) ([place]) 07/2017   pt refused cath  . Pericardial effusion    a. small by echo 06/2017.  Marland Kitchen Renal disorder   . Seizure ([place])   . Stroke (cerebrum) (  [place])     SURGICAL HISTORY: Past Surgical History:  Procedure Laterality Date  . ABDOMINAL AORTIC ANEURYSM REPAIR    . KIDNEY SURGERY      SOCIAL HISTORY: Social History   Socioeconomic History  . Marital status: Single    Spouse name: Not on file  . Number of children: Not on  file  . Years of education: Not on file  . Highest education level: Not on file  Occupational History  . Not on file  Social Needs  . Financial resource strain: Not on file  . Food insecurity:    Worry: Not on file    Inability: Not on file  . Transportation needs:    Medical: Not on file    Non-medical: Not on file  Tobacco Use  . Smoking status: Never Smoker  . Smokeless tobacco: Never Used  Substance and Sexual Activity  . Alcohol use: No    Frequency: Never  . Drug use: No  . Sexual activity: Not on file  Lifestyle  . Physical activity:    Days per week: Not on file    Minutes per session: Not on file  . Stress: Not on file  Relationships  . Social connections:    Talks on phone: Not on file    Gets together: Not on file    Attends religious service: Not on file    Active member of club or organization: Not on file    Attends meetings of clubs or organizations: Not on file    Relationship status: Not on file  . Intimate partner violence:    Fear of current or ex partner: Not on file    Emotionally abused: Not on file    Physically abused: Not on file    Forced sexual activity: Not on file  Other Topics Concern  . Not on file  Social History Narrative  . Not on file    FAMILY HISTORY: Family History  Problem Relation Age of Onset  . Hypertension Mother   . Kidney disease Mother   . Asthma Father   . Hypertension Father   . Kidney disease Father   . Hypertension Sister   . Heart disease Sister   . Kidney disease Sister   . Heart disease Brother   . Hypertension Brother   . COPD Brother     ALLERGIES:  is allergic to clonidine derivatives and hydralazine hcl.  MEDICATIONS:  Current Outpatient Medications  Medication Sig Dispense Refill  . amLODipine (NORVASC) 10 MG tablet Take 10 mg by mouth daily.    Marland Kitchen aspirin EC 81 MG tablet Take 81 mg daily by mouth.    Marland Kitchen atorvastatin (LIPITOR) 40 MG tablet Take 40 mg at bedtime by mouth.    . calcitRIOL  (ROCALTROL) 0.5 MCG capsule Take by mouth. Take 1 capsule by mouth every day    . carvedilol (COREG) 25 MG tablet Take 25 mg 2 (two) times daily with a meal by mouth.    . cholecalciferol (VITAMIN D) 1000 units tablet Take 1,000 Units by mouth 2 (two) times daily.    . cloNIDine (CATAPRES) 0.2 MG tablet Take 0.2 mg by mouth 2 (two) times daily.  23  . furosemide (LASIX) 80 MG tablet Take 80 mg at bedtime by mouth.    . hydrALAZINE (APRESOLINE) 100 MG tablet Take 100 mg by mouth 3 (three) times daily.    . hydrOXYzine (ATARAX/VISTARIL) 10 MG tablet 10 mg daily as needed for itching or anxiety. Take  1 tablet daily as needed     . Lactobacillus Rhamnosus, GG, (CULTURELLE) CAPS Take 1 capsule by mouth daily.    Marland Kitchen losartan (COZAAR) 50 MG tablet Take 50 mg by mouth every evening.  5  . multivitamin (RENA-VIT) TABS tablet Take 1 tablet by mouth 2 (two) times daily.    . Omega-3 Fatty Acids (OMEGA III EPA+DHA) 1000 MG CAPS     . pantoprazole (PROTONIX) 40 MG tablet TAKE 1 TABLET BY MOUTH EVERY DAY    . sevelamer carbonate (RENVELA) 800 MG tablet Take 1,600 mg by mouth See admin instructions. Take 2 tablets (1600 mg) three times daily with meals and with snacks    . WHITE PETROLATUM-MINERAL OIL OP Apply to incision twice a day PRN itching     No current facility-administered medications for this visit.      PHYSICAL EXAMINATION: ECOG PERFORMANCE STATUS: 1 - Symptomatic but completely ambulatory Vitals:   08/08/18 1405 08/08/18 1410  BP: (!) 185/89 (!) 185/109  Pulse: 89   Resp: 18   Temp: (!) 96.9 F (36.1 C)    Filed Weights   08/08/18 1405  Weight: 146 lb 1.6 oz (66.3 kg)    Physical Exam Constitutional:      General: He is not in acute distress.    Appearance: He is well-developed.  HENT:     Head: Normocephalic and atraumatic.     Right Ear: External ear normal.     Left Ear: External ear normal.  Eyes:     General: No scleral icterus.    Pupils: Pupils are equal, round, and  reactive to light.  Neck:     Musculoskeletal: Normal range of motion and neck supple.  Cardiovascular:     Rate and Rhythm: Normal rate and regular rhythm.     Heart sounds: Normal heart sounds.  Pulmonary:     Effort: Pulmonary effort is normal. No respiratory distress.     Breath sounds: Normal breath sounds. No wheezing or rales.  Chest:     Chest wall: No tenderness.  Abdominal:     General: Bowel sounds are normal. There is no distension.     Palpations: Abdomen is soft. There is no mass.     Tenderness: There is no abdominal tenderness. There is no guarding.  Musculoskeletal: Normal range of motion.        General: No deformity.  Lymphadenopathy:     Cervical: No cervical adenopathy.  Skin:    General: Skin is warm and dry.     Coloration: Skin is pale.     Findings: No erythema or rash.  Neurological:     Mental Status: He is alert and oriented to person, place, and time.     Cranial Nerves: No cranial nerve deficit.     Coordination: Coordination normal.     Deep Tendon Reflexes: Reflexes normal.  Psychiatric:        Behavior: Behavior normal.        Thought Content: Thought content normal.      LABORATORY DATA:  I have reviewed the data as listed Lab Results  Component Value Date   WBC 7.1 04/05/2018   HGB 8.9 (L) 04/05/2018   HCT 28.1 (L) 04/05/2018   MCV 85.9 04/05/2018   PLT 248 04/05/2018   Recent Labs    04/03/18 1811 04/04/18 0147 04/05/18 0900  NA 140 139 137  K 3.8 5.2* 4.1  CL 97* 97* 95*  CO2 '23 24 29  '$ GLUCOSE 71  96 111*  BUN 83* 89* 31*  CREATININE 15.81* 18.56* 9.94*  CALCIUM 9.3 9.1 9.6  GFRNONAA 3* 3* 5*  GFRAA 4* 3* 6*  PROT  --  6.3*  --   ALBUMIN  --  3.2* 3.3*  AST  --  21  --   ALT  --  16  --   ALKPHOS  --  76  --   BILITOT  --  1.0  --    Labs from [place] reviewed by me. 11/23/17.  Hemoglobin 8.5, vitamin B12 1199, folate 12.8, ferritin 1878.  Iron 59, TIBC 240.  WBC 6, MCV 88.6, platelet counts 2 41,000.  TSH  2.97 01/03/18   Hemoglobin 8.9, iron saturation 73, iron 160, TIBC 219, wbc 5.5, platelet 232,000,  01/07/2018 Hemoglobin 9.1 ferritin 1924, Iron saturation 38, TIBC 221 01/14/18  Hemoglobin 8.8, Hct 26.4. 03/06/2018 hemoglobin 7.1, HCT 22.4. 03/20/2018 hemoglobin 7.1  ASSESSMENT & PLAN:  1. ESRD (end stage renal disease) on dialysis ([place])   2. Anemia due to end stage renal disease ([place])   3. Uncontrolled hypertension   #Labs from [name] reviewed.   #Symptomatic anemia, hemoglobin 5.7.  Discussed with patient and I urged patient to go to emergency room immediately for blood transfusion.  Patient declines because he has 2 kids with him. Had a lengthy discussion with patient explained to him the necessity of following medical advice and the consequence of severe anemia.  He voices understanding and appreciate explanation.  He says he will go to emergency room tomorrow a.m.  #Uncontrolled hypertension, hold Aranesp.  Patient can follow-up in 2 weeks for reevaluation of Aranesp.  Advised patient to take blood pressure medication prior to coming to the visit.   The patient knows to call the clinic with any problems questions or concerns. Cc Dr.Singh Return of visit: 2 weeks.    Earlie Server, MD, PhD Hematology Oncology [facility] at [facility] Pager- [phone] 08/08/2018

## 2018-08-17 ENCOUNTER — Other Ambulatory Visit: Payer: Self-pay

## 2018-08-17 ENCOUNTER — Encounter (HOSPITAL_COMMUNITY): Payer: Self-pay | Admitting: Emergency Medicine

## 2018-08-17 ENCOUNTER — Inpatient Hospital Stay (HOSPITAL_COMMUNITY)
Admission: EM | Admit: 2018-08-17 | Discharge: 2018-08-17 | DRG: 640 | Discharge status: Against Medical Advice | Payer: Medicare Other | Attending: Internal Medicine | Admitting: Internal Medicine

## 2018-08-17 DIAGNOSIS — Z992 Dependence on renal dialysis: Secondary | ICD-10-CM

## 2018-08-17 DIAGNOSIS — Z79899 Other long term (current) drug therapy: Secondary | ICD-10-CM

## 2018-08-17 DIAGNOSIS — Z9115 Patient's noncompliance with renal dialysis: Secondary | ICD-10-CM

## 2018-08-17 DIAGNOSIS — Z825 Family history of asthma and other chronic lower respiratory diseases: Secondary | ICD-10-CM

## 2018-08-17 DIAGNOSIS — Z9119 Patient's noncompliance with other medical treatment and regimen: Secondary | ICD-10-CM

## 2018-08-17 DIAGNOSIS — Z8249 Family history of ischemic heart disease and other diseases of the circulatory system: Secondary | ICD-10-CM | POA: Diagnosis not present

## 2018-08-17 DIAGNOSIS — I132 Hypertensive heart and chronic kidney disease with heart failure and with stage 5 chronic kidney disease, or end stage renal disease: Secondary | ICD-10-CM | POA: Diagnosis present

## 2018-08-17 DIAGNOSIS — E785 Hyperlipidemia, unspecified: Secondary | ICD-10-CM | POA: Diagnosis present

## 2018-08-17 DIAGNOSIS — Z8673 Personal history of transient ischemic attack (TIA), and cerebral infarction without residual deficits: Secondary | ICD-10-CM

## 2018-08-17 DIAGNOSIS — K219 Gastro-esophageal reflux disease without esophagitis: Secondary | ICD-10-CM | POA: Diagnosis present

## 2018-08-17 DIAGNOSIS — Z888 Allergy status to other drugs, medicaments and biological substances status: Secondary | ICD-10-CM

## 2018-08-17 DIAGNOSIS — I10 Essential (primary) hypertension: Secondary | ICD-10-CM | POA: Diagnosis present

## 2018-08-17 DIAGNOSIS — I5042 Chronic combined systolic (congestive) and diastolic (congestive) heart failure: Secondary | ICD-10-CM | POA: Diagnosis present

## 2018-08-17 DIAGNOSIS — D631 Anemia in chronic kidney disease: Secondary | ICD-10-CM | POA: Diagnosis present

## 2018-08-17 DIAGNOSIS — Z841 Family history of disorders of kidney and ureter: Secondary | ICD-10-CM | POA: Diagnosis not present

## 2018-08-17 DIAGNOSIS — I252 Old myocardial infarction: Secondary | ICD-10-CM

## 2018-08-17 DIAGNOSIS — D649 Anemia, unspecified: Secondary | ICD-10-CM

## 2018-08-17 DIAGNOSIS — N186 End stage renal disease: Secondary | ICD-10-CM | POA: Diagnosis present

## 2018-08-17 DIAGNOSIS — Z5329 Procedure and treatment not carried out because of patient's decision for other reasons: Secondary | ICD-10-CM | POA: Diagnosis present

## 2018-08-17 DIAGNOSIS — I447 Left bundle-branch block, unspecified: Secondary | ICD-10-CM | POA: Diagnosis present

## 2018-08-17 DIAGNOSIS — Z7982 Long term (current) use of aspirin: Secondary | ICD-10-CM

## 2018-08-17 DIAGNOSIS — E875 Hyperkalemia: Principal | ICD-10-CM | POA: Diagnosis present

## 2018-08-17 LAB — CBC
HEMATOCRIT: 15.2 % — AB (ref 39.0–52.0)
Hemoglobin: 5 g/dL — CL (ref 13.0–17.0)
MCH: 29.1 pg (ref 26.0–34.0)
MCHC: 32.9 g/dL (ref 30.0–36.0)
MCV: 88.4 fL (ref 80.0–100.0)
NRBC: 0 % (ref 0.0–0.2)
Platelets: 174 10*3/uL (ref 150–400)
RBC: 1.72 MIL/uL — ABNORMAL LOW (ref 4.22–5.81)
RDW: 12.7 % (ref 11.5–15.5)
WBC: 8.3 10*3/uL (ref 4.0–10.5)

## 2018-08-17 LAB — COMPREHENSIVE METABOLIC PANEL
ALBUMIN: 3.9 g/dL (ref 3.5–5.0)
ALT: 24 U/L (ref 0–44)
AST: 26 U/L (ref 15–41)
Alkaline Phosphatase: 101 U/L (ref 38–126)
Anion gap: 32 — ABNORMAL HIGH (ref 5–15)
BUN: 171 mg/dL — AB (ref 6–20)
CHLORIDE: 94 mmol/L — AB (ref 98–111)
CO2: 15 mmol/L — AB (ref 22–32)
CREATININE: 38.66 mg/dL — AB (ref 0.61–1.24)
Calcium: 9.7 mg/dL (ref 8.9–10.3)
GFR calc non Af Amer: 1 mL/min — ABNORMAL LOW (ref >60)
GFR, EST AFRICAN AMERICAN: 1 mL/min — AB (ref >60)
GLUCOSE: 99 mg/dL (ref 70–99)
Potassium: >7.5 mmol/L (ref 3.5–5.1)
SODIUM: 141 mmol/L (ref 135–145)
Total Bilirubin: 0.8 mg/dL (ref 0.3–1.2)
Total Protein: 7.7 g/dL (ref 6.5–8.1)

## 2018-08-17 LAB — ABO/RH: ABO/RH(D): A POS

## 2018-08-17 LAB — PREPARE RBC (CROSSMATCH)

## 2018-08-17 LAB — POC OCCULT BLOOD, ED: FECAL OCCULT BLD: NEGATIVE

## 2018-08-17 MED ORDER — INSULIN ASPART 100 UNIT/ML IV SOLN: 5.0000 [IU] | Freq: Once | INTRAVENOUS | Status: DC

## 2018-08-17 MED ORDER — SODIUM ZIRCONIUM CYCLOSILICATE 5 G PO PACK
5.0000 g | PACK | Freq: Once | ORAL | Status: AC
  Administered 2018-08-17: 5 g via ORAL
  Filled 2018-08-17: qty 1

## 2018-08-17 MED ORDER — ALBUTEROL SULFATE (2.5 MG/3ML) 0.083% IN NEBU: 10.0000 mg | INHALATION_SOLUTION | Freq: Once | RESPIRATORY_TRACT | Status: DC

## 2018-08-17 MED ORDER — DEXTROSE 50 % IV SOLN: 1.0000 | Freq: Once | INTRAVENOUS | Status: DC

## 2018-08-17 MED ORDER — SODIUM CHLORIDE 0.9% IV SOLUTION
Freq: Once | INTRAVENOUS | Status: AC
  Administered 2018-08-17: 09:00:00 via INTRAVENOUS

## 2018-08-17 MED ORDER — CALCIUM GLUCONATE-NACL 1-0.675 GM/50ML-% IV SOLN: 1.0000 g | Freq: Once | INTRAVENOUS | Status: DC

## 2018-08-17 MED ORDER — SODIUM BICARBONATE 8.4 % IV SOLN: 50.0000 meq | Freq: Once | INTRAVENOUS | Status: DC

## 2018-08-17 NOTE — ED Notes (Signed)
Spoke with may, pharmacist, advised to repeat potassium level before giving further medications ; Angel [name], [place] agrees

## 2018-08-17 NOTE — ED Notes (Signed)
Pt states he does not want to be admitted; refusing transport to assigned inpatient bed; states he wants to be discharged. RN aware.

## 2018-08-17 NOTE — ED Notes (Signed)
Gertie Fey, PA aware of bp 206/133; pt asymptomatic ; no further orders received at this time

## 2018-08-17 NOTE — Discharge Summary (Signed)
Patient leaving [place]. Since admission assessment, I have seen patient multiple times and talk to him.  He had since morning only expressed concern that he will not like to have hemodialysis today as he is in his dry weight.  We discussed in details, myself, ER providers as well as nephrologist all counseled him that he needs urgent dialysis.  Patient was ordered 2 units of blood transfusion in the ER, he finished 1 unit of blood transfusion and wanted to leave.  I went to examine the patient.  He is hypertensive, he is walking on the hallway and ready to be discharged.  Counseled him again that his potassium is dangerously high and he needs hemodialysis today, however he refused. Patient is alert oriented x4.  He understands not getting dialysis fatal.  I explained to him that not getting dialysis today can be dangerous and may prove to be fatal.  He could repeat that to me and he stated that he is a grownup adult who makes his own healthcare decisions and we should not interfere with his wishes.  Knowing all risks of leaving today without completing treatment, he left from the ER.  Change patient needs capable of making his healthcare decisions, will could not admit with involuntary commitment. I relayed this to Nephrologist.   He was offered at least recheck of potassium and received calcium gluconate, insulin, bicarbonate and a dose of albuterol, he did not agree.

## 2018-08-17 NOTE — ED Notes (Addendum)
Pt states " I want to leave AMA"  Dr. Dante Gang  at bedside and talking to patient; pt alert and oriented x 4 , pt able to walk with strong and steady gait

## 2018-08-17 NOTE — ED Notes (Signed)
Dr. Ronnald Nian aware of 5.0 hgb and >7.5 potassium level ; no further orders received

## 2018-08-17 NOTE — ED Provider Notes (Addendum)
Medical screening examination/treatment/procedure(s) were conducted as a shared visit with non-physician practitioner(s) and myself.  I personally evaluated the patient during the encounter. Briefly, the patient is a 48 y.o. male with history of end-stage renal disease on dialysis, chronic anemia who presents to the ED with concern for low hemoglobin.  Patient mildly tachycardic upon arrival.  Has had increased fatigue.  Patient does get Epogen, iron while at dialysis.  However requires transfusion every so for months.  Patient looks pale on exam.  Denies any abdominal tenderness.  Denies any dark stools.  However patient denies any chest pain, shortness of breath.  Overall well-appearing.  Hemoglobin 5.  Potassium 7.5, will give meds to lower. EKG sinus, no HyperK changes.  Will type and screen and transfuse patient.  Will admit for further transfusion, care.  Hemodynamically stable throughout my care.  This chart was dictated using voice recognition software.  Despite best efforts to proofread,  errors can occur which can change the documentation meaning.    EKG Interpretation None          Lennice Sites, DO 08/17/18 0805    Lennice Sites, DO 08/17/18 2010

## 2018-08-17 NOTE — ED Provider Notes (Signed)
[facility] EMERGENCY DEPARTMENT Provider Note   CSN: [id number] Arrival date & time: 08/17/18  0548     History   Chief Complaint Chief Complaint  Patient presents with  . Anemia    HPI Angel [name] is a 48 y.o. male.  The history is provided by the patient and medical records. No language interpreter was used.  Anemia      48 year old with history of chronic anemia, CHF, end-stage renal disease currently on dialysis, GERD, AAA presenting to the ED requesting for blood transfusion due to low hemoglobin.  Patient states 2 years ago he has several benign tumors throughout his body that was removed.  Since then he reports he has had recurrent rectal bleeding.  For the past 2 days he noticed trace of blood mixed in his stools when he wipes.  He also mention during his dialysis session and his blood work was done 2 days prior, he was told that his hemoglobin is low at 5.6 and he would benefit from blood transfusion.  Patient normally goes to the cancer center for infusion but was recommended to come to the ER instead.  He was given the options of going to [place] or here and decided to come here.  He denies any significant abdominal pain, bleeding from the gums or any other abnormal bleeding.  He mention his baseline hemoglobin is usually in the sevens.  No complaints of lightheadedness, dizziness, chest pain or shortness of breath.  Furthermore, patient states for the past year he felt that his apartment is having a gas [place].  He normally feels not himself when he is in the apartment.  States he lost approximately 6 pounds within the past 6 months and always have lack of appetite because he feels bloated.  He mention able to see gas in the air which bothers him.  He is in the process of trying to find another apartment to live.  He denies any significant lightheadedness or dizziness.   Past Medical History:  Diagnosis Date  . AAA (abdominal aortic aneurysm) ([place])    . Chronic anemia   . Chronic combined systolic and diastolic CHF (congestive heart failure) ([place])   . End stage renal disease ([place])   . ESRD on dialysis ([place])   . FSGS (focal segmental glomerulosclerosis)   . GERD (gastroesophageal reflux disease)   . HLD (hyperlipidemia)   . Hypertension   . NSTEMI (non-ST elevated myocardial infarction) ([place]) 07/2017   pt refused cath  . Pericardial effusion    a. small by echo 06/2017.  Marland Kitchen Renal disorder   . Seizure ([place])   . Stroke (cerebrum) [facility])     Patient Active Problem List   Diagnosis Date Noted  . Acute renal failure (ARF) ([place]) 04/03/2018  . Chronic combined systolic and diastolic CHF (congestive heart failure) ([place]) 08/14/2017  . Thrombocytopenia ([place]) 07/30/2017  . Dialysis patient, noncompliant ([place])   . HTN (hypertension) 07/13/2017  . Non-ST elevation (NSTEMI) myocardial infarction ([place])   . Elevated troponin   . GERD (gastroesophageal reflux disease) 06/20/2017  . Seizure disorder ([place]) 06/20/2017  . Abnormal LFTs 06/20/2017  . Anemia in ESRD (end-stage renal disease) ([place]) 06/20/2017  . HLD (hyperlipidemia)   . Stroke (cerebrum) ([place])   . ESRD (end stage renal disease) on dialysis ([place])   . Acute on chronic diastolic CHF (congestive heart failure) ([place])   . Acute post-operative pain 02/14/2017  . Acute respiratory insufficiency 02/14/2017  . Febrile nonhemolytic  transfusion reaction 02/13/2017  . FSGS (focal segmental glomerulosclerosis) 02/06/2017  . History of stroke 09/13/2016  . Palpitations 09/13/2016  . Abdominal aortic aneurysm (AAA) without rupture ([place]) 07/21/2016  . 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 Pre-transplant evaluation for kidney transplant 03/20/2016    Past Surgical History:  Procedure Laterality Date  . ABDOMINAL AORTIC ANEURYSM REPAIR    . KIDNEY SURGERY          Home Medications    Prior to Admission medications   Medication Sig Start Date End Date Taking? Authorizing  Provider  amLODipine (NORVASC) 10 MG tablet Take 10 mg by mouth daily.   Yes [provider]  aspirin EC 81 MG tablet Take 81 mg daily by mouth.   Yes [provider]  atorvastatin (LIPITOR) 40 MG tablet Take 40 mg at bedtime by mouth.   Yes [provider]  calcitRIOL (ROCALTROL) 0.5 MCG capsule Take 0.5 mcg by mouth daily.    Yes [provider]  carvedilol (COREG) 25 MG tablet Take 25 mg 2 (two) times daily with a meal by mouth.   Yes [provider]  cholecalciferol (VITAMIN D) 1000 units tablet Take 1,000 Units by mouth 2 (two) times daily.   Yes [provider]  cloNIDine (CATAPRES) 0.2 MG tablet Take 0.2 mg by mouth 2 (two) times daily. 10/19/17  Yes [provider]  furosemide (LASIX) 80 MG tablet Take 80 mg at bedtime by mouth.   Yes [provider]  hydrALAZINE (APRESOLINE) 100 MG tablet Take 100 mg by mouth 3 (three) times daily.   Yes [provider]  hydrOXYzine (ATARAX/VISTARIL) 10 MG tablet Take 10 mg by mouth daily as needed for itching or anxiety. Take 1 tablet daily as needed    Yes [provider]  irbesartan (AVAPRO) 150 MG tablet Take 150 mg by mouth daily.   Yes [provider]  Lactobacillus Rhamnosus, GG, (CULTURELLE) CAPS Take 1 capsule by mouth daily.   Yes [provider]  multivitamin (RENA-VIT) TABS tablet Take 1 tablet by mouth 2 (two) times daily.   Yes [provider]  Omega-3 Fatty Acids (OMEGA III EPA+DHA) 1000 MG CAPS    Yes [provider]  pantoprazole (PROTONIX) 40 MG tablet Take 40 mg by mouth daily.  07/03/16  Yes [provider]  sevelamer carbonate (RENVELA) 800 MG tablet Take 1,600 mg by mouth See admin instructions. Take 2 tablets (1600 mg) three times daily with meals and with snacks   Yes [provider]  WHITE PETROLATUM-MINERAL OIL OP Apply 1 application topically 2 (two) times daily as needed (itching).  03/12/17   Yes [provider]    Family History Family History  Problem Relation Age of Onset  . Hypertension Mother   . Kidney disease Mother   . Asthma Father   . Hypertension Father   . Kidney disease Father   . Hypertension Sister   . Heart disease Sister   . Kidney disease Sister   . Heart disease Brother   . Hypertension Brother   . COPD Brother     Social History Social History   Tobacco Use  . Smoking status: Never Smoker  . Smokeless tobacco: Never Used  Substance Use Topics  . Alcohol use: No    Frequency: Never  . Drug use: No     Allergies   Clonidine derivatives and Hydralazine hcl   Review of Systems Review of Systems  All other systems reviewed  and are negative.    Physical Exam Updated Vital Signs BP (!) 191/114 (BP Location: Right Arm)   Pulse (!) 103   Temp 98.3 F (36.8 C) (Oral)   Resp 18   SpO2 100%   Physical Exam Vitals signs and nursing note reviewed.  Constitutional:      General: He is not in acute distress.    Appearance: He is well-developed.  HENT:     Head: Atraumatic.     Mouth/Throat:     Mouth: Mucous membranes are dry.  Eyes:     Conjunctiva/sclera: Conjunctivae normal.     Comments: Conjunctiva pale.  Neck:     Musculoskeletal: Neck supple.  Cardiovascular:     Rate and Rhythm: Tachycardia present.     Heart sounds: Murmur present.  Pulmonary:     Effort: Pulmonary effort is normal.     Breath sounds: Normal breath sounds. No wheezing or rales.  Abdominal:     Palpations: Abdomen is soft.     Tenderness: There is no abdominal tenderness.     Comments: Normal-appearing midline abdominal surgical scar nontender to palpation.  No hernia noted.  Genitourinary:    Comments: Chaperone present during exam.  No external thrombosed hemorrhoid, normal rectal tone, no obvious mass, no stool impaction, normal color stool on glove. Skin:    Findings: No rash.  Neurological:     Mental Status: He is alert and  oriented to person, place, and time.      ED Treatments / Results  Labs (all labs ordered are listed, but only abnormal results are displayed) Labs Reviewed  COMPREHENSIVE METABOLIC PANEL - Abnormal; Notable for the following components:      Result Value   Potassium >7.5 (*)    Chloride 94 (*)    CO2 15 (*)    BUN 171 (*)    Creatinine, Ser 38.66 (*)    GFR calc non Af Amer 1 (*)    GFR calc Af Amer 1 (*)    Anion gap 32 (*)    All other components within normal limits  CBC - Abnormal; Notable for the following components:   RBC 1.72 (*)    Hemoglobin 5.0 (*)    HCT 15.2 (*)    All other components within normal limits  CARBON MONOXIDE, BLOOD (PERFORMED AT REF LAB)  POC OCCULT BLOOD, ED  TYPE AND SCREEN  PREPARE RBC (CROSSMATCH)  ABO/RH    EKG EKG Interpretation  Date/Time:  Saturday August 17 2018 08:23:40 EST Ventricular Rate:  95 PR Interval:    QRS Duration: 114 QT Interval:  378 QTC Calculation: 476 R Axis:   2 Text Interpretation:  Sinus rhythm Incomplete left bundle branch block Borderline prolonged QT interval Baseline wander in lead(s) V3 V5 V6 Confirmed by Lennice Sites [phone]) on 08/17/2018 8:35:31 AM   Radiology No results found.  Procedures .Critical Care Performed by: Domenic Moras, PA-C Authorized by: Domenic Moras, PA-C   Critical care provider statement:    Critical care time (minutes):  45   Critical care was time spent personally by me on the following activities:  Discussions with consultants, evaluation of patient's response to treatment, examination of patient, ordering and performing treatments and interventions, ordering and review of laboratory studies, ordering and review of radiographic studies, pulse oximetry, re-evaluation of patient's condition, obtaining history from patient or surrogate and review of old charts   (including critical care time)  Medications Ordered in ED Medications  0.9 %  sodium  chloride infusion (Manually  program via Guardrails IV Fluids) ( Intravenous New Bag/Given 08/17/18 0922)  sodium zirconium cyclosilicate (LOKELMA) packet 5 g (5 g Oral Given 08/17/18 0909)     Initial Impression / Assessment and Plan / ED Course  I have reviewed the triage vital signs and the nursing notes.  Pertinent labs & imaging results that were available during my care of the patient were reviewed by me and considered in my medical decision making (see chart for details).     BP (!) 189/117   Pulse 98   Temp 98 F (36.7 C) (Oral)   Resp 17   SpO2 99%    Final Clinical Impressions(s) / ED Diagnoses   Final diagnoses:  Symptomatic anemia  Dialysis patient [facility])  Hyperkalemia    ED Discharge Orders    None     7:43 AM Patient with chronic kidney disease currently on dialysis here with hemoglobin at 5.  This is likely secondary to his chronic kidney disease, his baseline hemoglobin is usually in the 8s.  He also endorsed some GI bleeding with trace of red blood.  No melena to suggest upper GI bleed.  We will transfused patient and admit for further management.  9:41 AM Fecal occult blood test is negative.  Patient does have elevated potassium of greater than 7.5 without EKG changes, worsening renal function than baseline with BUN 171, creatinine 38.66.  His hemoglobin today is 5, much lower than baseline.  I will consult dialysis center for dialysis as well as hospitalist for admission for blood transfusion.  Patient is currently hypertensive.  He is well-appearing and appears to be in no acute discomfort.  Patient did mention having exposure to some test of gas at his apartment complex which has been ongoing issue for several months.  Will check carboxyhemoglobin level.  Currently he is in no acute respiratory discomfort and his O2 sat is at 99% on room air.  9:51 AM Appreciate consultation from nephrologist Dr. Melvia Heaps who will help with dialysis schedule.  Will consult medicine for admission.    10:08 AM Appreciate consultation from [facility] Dr. Sloan Leiter who agrees to see and admit pt for further care.  Pt received Lokelma for hyperkalemia.  Anticipate dialysis session will help normalize his potassium level.  No acute EKG changes.   Currently carboxyhemoglobin level have not resulted yet.    Domenic Moras, PA-C 08/17/18 [place], [place], DO 08/17/18 1040

## 2018-08-17 NOTE — ED Notes (Signed)
Dr. Dante Gang aware of psychiatric evaluation order that was put in by nephrology ; per dr. Dante Gang " we cannot keep patient here if he doesn't want to be here since he is not IVC"

## 2018-08-17 NOTE — ED Triage Notes (Signed)
Patient here for blood transfusion due to low Hgb of 5.8.  He states that he was supposed to go to [facility] but it was too far for him to travel.  Patient states that he also may be exposed to gas in his apartment.

## 2018-08-17 NOTE — H&P (Signed)
History and Physical    Lula Michaux Wanek CWC:[id number] DOB: 19-Mar-1971 DOA: 08/17/2018  PCP: Gwenlyn Saran, MD  Patient coming from: Home.  I have personally briefly reviewed patient's old medical records in Epic and Chart everywhere.   Chief Complaint: I am anemic and my hemoglobin is low.  HPI: Briscoe Daniello is a 48 y.o. male with medical history significant of ESRD on hemodialysis, Tuesday Thursday and Saturday schedule as per the patient.  He is very noncompliant.  Usually goes to [place].  Severe hypertension, hyperlipidemia, chronic anemia who presented to the emergency room with low hemoglobin.  Patient is difficult historian.  Patient states that most of his problems are caused by doctors and nurses.  He states that he feels excellent, he has no symptoms today, however because of his low hemoglobin he has to come to the emergency room.  When talking about his potassium and creatinine, he talks about doctors mistake and high iron level.  Patient states that he had blood transfusion 2 months ago and his hemoglobin is usually low.  Patient normally goes to cancer center at [facility] for infusion, somebody called him to come to the ER.  He says his hemoglobin is low so he should not drive and go to [place] instead came to [facility], ER.  Patient lives near the hospital, however because of conflicts at dialysis centers he had to drive all the way to [facility] for dialysis.  Patient states that he had his dialysis done on Thursday. When I examined the patient, he was receiving blood transfusion, he was walking around in the hallway.  Denies any symptoms.  Denies any hemoptysis, hematemesis or hematochezia.  Denies any melena. patient stated his desire to leave after blood transfusion and not testing for hemodialysis. ED Course: In the emergency room, he is hypertensive.  Hemoglobin is 5.  Potassium is more than 7.5.  Creatinine is 38.66.  Patient was given a dose of Lokelma.  EKG  shows no hyperacute T wave consistent to hyperkalemia.  Nephrology was consulted for urgent hemodialysis.  On my initial evaluation, patient already stated that he is not going to stay back for dialysis.  Review of Systems: As per HPI otherwise 10 point review of systems negative.    Past Medical History:  Diagnosis Date  . AAA (abdominal aortic aneurysm) ([place])   . Chronic anemia   . Chronic combined systolic and diastolic CHF (congestive heart failure) ([place])   . End stage renal disease ([place])   . ESRD on dialysis ([place])   . FSGS (focal segmental glomerulosclerosis)   . GERD (gastroesophageal reflux disease)   . HLD (hyperlipidemia)   . Hypertension   . NSTEMI (non-ST elevated myocardial infarction) ([place]) 07/2017   pt refused cath  . Pericardial effusion    a. small by echo 06/2017.  Marland Kitchen Renal disorder   . Seizure ([place])   . Stroke (cerebrum) [facility])     Past Surgical History:  Procedure Laterality Date  . ABDOMINAL AORTIC ANEURYSM REPAIR    . KIDNEY SURGERY       reports that he has never smoked. He has never used smokeless tobacco. He reports that he does not drink alcohol or use drugs.  Allergies  Allergen Reactions  . Clonidine Derivatives Swelling and Other (See Comments)    Leg swelling  . Hydralazine Hcl Swelling and Other (See Comments)    Leg swelling     Family History  Problem Relation Age of Onset  . Hypertension  Mother   . Kidney disease Mother   . Asthma Father   . Hypertension Father   . Kidney disease Father   . Hypertension Sister   . Heart disease Sister   . Kidney disease Sister   . Heart disease Brother   . Hypertension Brother   . COPD Brother      Prior to Admission medications   Medication Sig Start Date End Date Taking? Authorizing Provider  amLODipine (NORVASC) 10 MG tablet Take 10 mg by mouth daily.   Yes [provider]  aspirin EC 81 MG tablet Take 81 mg daily by mouth.   Yes [provider]  atorvastatin (LIPITOR) 40  MG tablet Take 40 mg at bedtime by mouth.   Yes [provider]  calcitRIOL (ROCALTROL) 0.5 MCG capsule Take 0.5 mcg by mouth daily.    Yes [provider]  carvedilol (COREG) 25 MG tablet Take 25 mg 2 (two) times daily with a meal by mouth.   Yes [provider]  cholecalciferol (VITAMIN D) 1000 units tablet Take 1,000 Units by mouth 2 (two) times daily.   Yes [provider]  cloNIDine (CATAPRES) 0.2 MG tablet Take 0.2 mg by mouth 2 (two) times daily. 10/19/17  Yes [provider]  furosemide (LASIX) 80 MG tablet Take 80 mg at bedtime by mouth.   Yes [provider]  hydrALAZINE (APRESOLINE) 100 MG tablet Take 100 mg by mouth 3 (three) times daily.   Yes [provider]  hydrOXYzine (ATARAX/VISTARIL) 10 MG tablet Take 10 mg by mouth daily as needed for itching or anxiety. Take 1 tablet daily as needed    Yes [provider]  irbesartan (AVAPRO) 150 MG tablet Take 150 mg by mouth daily.   Yes [provider]  Lactobacillus Rhamnosus, GG, (CULTURELLE) CAPS Take 1 capsule by mouth daily.   Yes [provider]  multivitamin (RENA-VIT) TABS tablet Take 1 tablet by mouth 2 (two) times daily.   Yes [provider]  Omega-3 Fatty Acids (OMEGA III EPA+DHA) 1000 MG CAPS    Yes [provider]  pantoprazole (PROTONIX) 40 MG tablet Take 40 mg by mouth daily.  07/03/16  Yes [provider]  sevelamer carbonate (RENVELA) 800 MG tablet Take 1,600 mg by mouth See admin instructions. Take 2 tablets (1600 mg) three times daily with meals and with snacks   Yes [provider]  WHITE PETROLATUM-MINERAL OIL OP Apply 1 application topically 2 (two) times daily as needed (itching).  03/12/17  Yes [provider]    Physical Exam: Vitals:   08/17/18 0806 08/17/18 0922 08/17/18 0937 08/17/18 1023  BP: (!) 179/113 (!) 183/119 (!) 189/117 (!) 206/133  Pulse: 98 98  99  Resp: 13 17  18     Temp:  97.9 F (36.6 C) 98 F (36.7 C)   TempSrc:  Oral Oral   SpO2: 97% 99%  98%    Constitutional: NAD, calm, comfortable Vitals:   08/17/18 0806 08/17/18 0922 08/17/18 0937 08/17/18 1023  BP: (!) 179/113 (!) 183/119 (!) 189/117 (!) 206/133  Pulse: 98 98  99  Resp: 13 17  18   Temp:  97.9 F (36.6 C) 98 F (36.7 C)   TempSrc:  Oral Oral   SpO2: 97% 99%  98%   Eyes: PERRL, lids and conjunctivae normal Walking around in the hallway.  Not in any distress.  Pale. ENMT: Mucous membranes are moist. Posterior pharynx clear of any exudate or lesions.Normal dentition.  Neck: normal, supple, no masses, no thyromegaly Respiratory: clear to auscultation bilaterally, no wheezing, no crackles. Normal respiratory effort. No accessory muscle use.  Permacath right chest. Cardiovascular: Regular rate and rhythm, no murmurs / rubs / gallops. No extremity edema. 2+ pedal pulses. No carotid bruits.  Abdomen: no tenderness, no masses palpated. No hepatosplenomegaly. Bowel sounds positive.  Musculoskeletal: no clubbing / cyanosis. No joint deformity upper and lower extremities. Good ROM, no contractures. Normal muscle tone.  Skin: no rashes, lesions, ulcers. No induration Neurologic: CN 2-12 grossly intact. Sensation intact, DTR normal. Strength 5/5 in all 4.  Psychiatric: Alert and oriented x 4. Normal mood.  He understands his situation.    Labs on Admission: I have personally reviewed following labs and imaging studies  CBC: Recent Labs  Lab 08/17/18 0613  WBC 8.3  HGB 5.0*  HCT 15.2*  MCV 88.4  PLT 937   Basic Metabolic Panel: Recent Labs  Lab 08/17/18 0613  NA 141  K >7.5*  CL 94*  CO2 15*  GLUCOSE 99  BUN 171*  CREATININE 38.66*  CALCIUM 9.7   GFR: Estimated Creatinine Clearance: 2.1 mL/min (A) (by C-G formula based on SCr of 38.66 mg/dL (H)). Liver Function Tests: Recent Labs  Lab 08/17/18 0613  AST 26  ALT 24  ALKPHOS 101  BILITOT 0.8  PROT 7.7  ALBUMIN 3.9    No results for input(s): LIPASE, AMYLASE in the last 168 hours. No results for input(s): AMMONIA in the last 168 hours. Coagulation Profile: No results for input(s): INR, PROTIME in the last 168 hours. Cardiac Enzymes: No results for input(s): CKTOTAL, CKMB, CKMBINDEX, TROPONINI in the last 168 hours. BNP (last 3 results) No results for input(s): PROBNP in the last 8760 hours. HbA1C: No results for input(s): HGBA1C in the last 72 hours. CBG: No results for input(s): GLUCAP in the last 168 hours. Lipid Profile: No results for input(s): CHOL, HDL, LDLCALC, TRIG, CHOLHDL, LDLDIRECT in the last 72 hours. Thyroid Function Tests: No results for input(s): TSH, T4TOTAL, FREET4, T3FREE, THYROIDAB in the last 72 hours. Anemia Panel: No results for input(s): VITAMINB12, FOLATE, FERRITIN, TIBC, IRON, RETICCTPCT in the last 72 hours. Urine analysis:    Component Value Date/Time   COLORURINE YELLOW (A) 01/04/2018 1030   APPEARANCEUR CLEAR (A) 01/04/2018 1030   LABSPEC 1.004 (L) 01/04/2018 1030   PHURINE 9.0 (H) 01/04/2018 1030   GLUCOSEU NEGATIVE 01/04/2018 1030   HGBUR NEGATIVE 01/04/2018 1030   BILIRUBINUR NEGATIVE 01/04/2018 1030   KETONESUR NEGATIVE 01/04/2018 1030   PROTEINUR 100 (A) 01/04/2018 1030   NITRITE NEGATIVE 01/04/2018 1030   LEUKOCYTESUR NEGATIVE 01/04/2018 1030    Radiological Exams on Admission: No results found.  EKG: Independently reviewed.  Normal sinus rhythm with no peaked T wave changes.  Assessment/Plan Principal Problem:   Anemia in ESRD (end-stage renal disease) ([name]) Active Problems:   HLD (hyperlipidemia)   ESRD (end stage renal disease) on dialysis ([name])   Hyperkalemia   GERD (gastroesophageal reflux disease)   HTN (hypertension)   Anemia   Severe anemia of chronic disease: Probably acute on chronic anemia.  Patient has minimum symptoms.  He was ordered 2 units of PRBC in the emergency room, will continue.  Recheck levels in the morning.  May  adjust with hemodialysis.  Does not have any evidence of GI bleeding.  FOBT was negative.  Patient not interested on any other procedures.  ESRD on hemodialysis, noncompliance, probably missed hemodialysis with severe hyperkalemia and very high creatinine.  Nephrology consulted from ER.  Patient may not agree for dialysis.  However emergent dialysis has been ordered. Patient received a dose of Lokelma. Also ordered insulin and dextrose as well as albuterol. I am afraid patient may need to stay for dialysis.  Accelerated hypertension: Resume home medications.  Hopefully dialysis will help.  Hyperlipidemia: On a statin.  Will continue.  DVT prophylaxis: SCDs. Code Status: Full code. Family Communication: Patient capable of making decisions. Disposition Plan: Home. Consults called: Nephrology. Admission status: Inpatient.   Barb Merino MD [facility] Pager [phone]  If 7PM-7AM, please contact night-coverage [email or web address] Password [email or web address]  08/17/2018, 10:30 AM

## 2018-08-17 NOTE — Consult Note (Addendum)
Renal Service Consult Note [facility]  [place] Angel [name] 08/17/2018 Sol Blazing Requesting Physician:    Reason for Consult:  ESRD pt w/ hyperkalemia and nmi HPI: The patient is a 48 y.o. 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  Labs drawn w/ Hb ~5, K > 7.5 and BUN 206/ Cr 33.  Asked to see for ESRD.    Patient last seen here by [place] in Sept 2019 when he was talking about using herbal methods to cure his renal disease and had missed dialysis, was admitted and had HD x 3 then was dc'd to resume outpt HD at his home unit in [place].    Patient was very agitated and upset and talked to me for 20 minutes about how the doctor's and nurses and techs at various hospital and facilities in the area have caused all his health problems and how he doesn't trust "the system".  How people at the dialysis unit are "getting their legs cut off" and "I'm supposed to put up with this".  I told him his K+ is very high and we recommend dialysis asap today but pt is refusing dialysis at this time.  He states he "knows his body" and "you don't know what's best for me, I know".   Pt states his regular HD unit is in [place].  He didn't give any other details. States he never had a fistula placed. Using the chest catheter.   ROS  denies CP  no joint pain   no HA  no blurry vision  no rash  no diarrhea  no nausea/ vomiting    Past Medical History  Past Medical History:  Diagnosis Date  . AAA (abdominal aortic aneurysm) ([place])   . Chronic anemia   . Chronic combined systolic and diastolic CHF (congestive heart failure) ([place])   . End stage renal disease ([place])   . ESRD on dialysis ([place])   . FSGS (focal segmental glomerulosclerosis)   . GERD (gastroesophageal reflux disease)   . HLD (hyperlipidemia)   . Hypertension   .  NSTEMI (non-ST elevated myocardial infarction) ([place]) 07/2017   pt refused cath  . Pericardial effusion    a. small by echo 06/2017.  Marland Kitchen Renal disorder   . Seizure ([place])   . Stroke (cerebrum) [facility])    Past Surgical History  Past Surgical History:  Procedure Laterality Date  . ABDOMINAL AORTIC ANEURYSM REPAIR    . KIDNEY SURGERY     Family History  Family History  Problem Relation Age of Onset  . Hypertension Mother   . Kidney disease Mother   . Asthma Father   . Hypertension Father   . Kidney disease Father   . Hypertension Sister   . Heart disease Sister   . Kidney disease Sister   . Heart disease Brother   . Hypertension Brother   . COPD Brother    Social History  reports that he has never smoked. He has never used smokeless tobacco. He reports that he does not drink alcohol or use drugs. Allergies  Allergies  Allergen Reactions  . Clonidine Derivatives Swelling and Other (See Comments)    Leg swelling  . Hydralazine Hcl Swelling and Other (See Comments)    Leg swelling    Home medications Prior to Admission medications  Medication Sig Start Date End Date Taking? Authorizing Provider  amLODipine (NORVASC) 10 MG tablet Take 10 mg by mouth daily.   Yes [provider]  aspirin EC 81 MG tablet Take 81 mg daily by mouth.   Yes [provider]  atorvastatin (LIPITOR) 40 MG tablet Take 40 mg at bedtime by mouth.   Yes [provider]  calcitRIOL (ROCALTROL) 0.5 MCG capsule Take 0.5 mcg by mouth daily.    Yes [provider]  carvedilol (COREG) 25 MG tablet Take 25 mg 2 (two) times daily with a meal by mouth.   Yes [provider]  cholecalciferol (VITAMIN D) 1000 units tablet Take 1,000 Units by mouth 2 (two) times daily.   Yes [provider]  cloNIDine (CATAPRES) 0.2 MG tablet Take 0.2 mg by mouth 2 (two) times daily. 10/19/17  Yes [provider]  furosemide (LASIX) 80 MG tablet Take 80 mg at bedtime by mouth.    Yes [provider]  hydrALAZINE (APRESOLINE) 100 MG tablet Take 100 mg by mouth 3 (three) times daily.   Yes [provider]  hydrOXYzine (ATARAX/VISTARIL) 10 MG tablet Take 10 mg by mouth daily as needed for itching or anxiety. Take 1 tablet daily as needed    Yes [provider]  irbesartan (AVAPRO) 150 MG tablet Take 150 mg by mouth daily.   Yes [provider]  Lactobacillus Rhamnosus, GG, (CULTURELLE) CAPS Take 1 capsule by mouth daily.   Yes [provider]  multivitamin (RENA-VIT) TABS tablet Take 1 tablet by mouth 2 (two) times daily.   Yes [provider]  Omega-3 Fatty Acids (OMEGA III EPA+DHA) 1000 MG CAPS    Yes [provider]  pantoprazole (PROTONIX) 40 MG tablet Take 40 mg by mouth daily.  07/03/16  Yes [provider]  sevelamer carbonate (RENVELA) 800 MG tablet Take 1,600 mg by mouth See admin instructions. Take 2 tablets (1600 mg) three times daily with meals and with snacks   Yes [provider]  WHITE PETROLATUM-MINERAL OIL OP Apply 1 application topically 2 (two) times daily as needed (itching).  03/12/17  Yes [provider]   Liver Function Tests Recent Labs  Lab 08/17/18 0613  AST 26  ALT 24  ALKPHOS 101  BILITOT 0.8  PROT 7.7  ALBUMIN 3.9   No results for input(s): LIPASE, AMYLASE in the last 168 hours. CBC Recent Labs  Lab 08/17/18 0613  WBC 8.3  HGB 5.0*  HCT 15.2*  MCV 88.4  PLT 259   Basic Metabolic Panel Recent Labs  Lab 08/17/18 0613  NA 141  K >7.5*  CL 94*  CO2 15*  GLUCOSE 99  BUN 171*  CREATININE 38.66*  CALCIUM 9.7   Iron/TIBC/Ferritin/ %Sat    Component Value Date/Time   IRON 33 (L) 04/03/2018 1445   TIBC 237 (L) 04/03/2018 1445   FERRITIN 1,690 (H) 04/03/2018 1445   IRONPCTSAT 14 (L) 04/03/2018 1445    Vitals:   08/17/18 0806 08/17/18 0922 08/17/18 0937 08/17/18 1023  BP: (!) 179/113 (!) 183/119 (!) 189/117 (!) 206/133  Pulse: 98 98   99  Resp: 13 17  18   Temp:  97.9 F (36.6 C) 98 F (36.7 C)   TempSrc:  Oral Oral   SpO2: 97% 99%  98%   Exam Gen small framed AAM no distress, agitated initially then calmed down somewhat, getting prbc's now, sitting on side of stretcher, O x3 No rash, cyanosis or gangrene Sclera  anicteric, throat clear  No jvd or bruits Chest clear bilat to bases no rales or wheezing RRR no MRG  Abd soft ntnd no mass or ascites +bs MS no joint effusions or deformity Ext: 1+ pretib bilat edema, no wounds or ulcers Neuro is alert, Ox 3 , nf    Home meds:  - amlodipine 10/ carvedilol 25 bid/ furosemide 80 qd/ hydralazine 100 tid/ irbesartan 150 qd/ clonidine 0.2 bid  - pantoprazole 40 / sevelamer carb 1.6gm ac tid/ aspirin 81/ atorvastatin 40 qd   Dialysis: says he gets HD in [place], I called the 2 [name] units and the 1 [facility] unit in [place] and none acknowledged him as their patient.     Assessment: 1. ESRD w/ severe hyperkalemia and azotemia most likely this is due to HD noncompliance.  Not clear how long he has missed HD.  He is refusing dialysis here; I explained to him that it could be life-threatening and he still refused. If stays for admission, in addition to acute IV therapies for hyperkalemia would recommend Lokelma as well. 2. Anemia - stool guiac neg per ED. Getting prbc's now.  Get OP info re ESA.   3. HTN, long standing 4. Hx AAA repair 5. H/o CVA 6. MBC ckd - cont binder, check phos. Ca ok.     P: 1. Lokelma po and IV Rx for hyperK+       Kelly Splinter MD [facility] pager [phone]   08/17/2018, 11:59 AM

## 2018-08-19 ENCOUNTER — Encounter: Payer: Self-pay | Admitting: Student

## 2018-08-19 LAB — BPAM RBC
BLOOD PRODUCT EXPIRATION DATE: 202002022359
Blood Product Expiration Date: 202001192359
ISSUE DATE / TIME: 202001110907
Unit Type and Rh: 6200
Unit Type and Rh: 6200

## 2018-08-19 LAB — TYPE AND SCREEN
ABO/RH(D): A POS
Antibody Screen: NEGATIVE
Unit division: 0
Unit division: 0

## 2018-08-19 LAB — CARBON MONOXIDE, BLOOD (PERFORMED AT REF LAB): Carbon Monoxide, Blood: 5.5 % — ABNORMAL HIGH (ref 0.0–3.6)

## 2018-08-20 ENCOUNTER — Inpatient Hospital Stay (HOSPITAL_COMMUNITY)
Admission: EM | Admit: 2018-08-20 | Discharge: 2018-08-21 | DRG: 291 | Discharge status: Against Medical Advice | Payer: Medicare Other | Attending: Internal Medicine | Admitting: Internal Medicine

## 2018-08-20 ENCOUNTER — Encounter (HOSPITAL_COMMUNITY): Payer: Self-pay | Admitting: Emergency Medicine

## 2018-08-20 ENCOUNTER — Emergency Department (HOSPITAL_COMMUNITY): Payer: Medicare Other

## 2018-08-20 DIAGNOSIS — Z7982 Long term (current) use of aspirin: Secondary | ICD-10-CM | POA: Diagnosis not present

## 2018-08-20 DIAGNOSIS — E872 Acidosis: Secondary | ICD-10-CM | POA: Diagnosis present

## 2018-08-20 DIAGNOSIS — J9601 Acute respiratory failure with hypoxia: Secondary | ICD-10-CM | POA: Diagnosis present

## 2018-08-20 DIAGNOSIS — Z9115 Patient's noncompliance with renal dialysis: Secondary | ICD-10-CM

## 2018-08-20 DIAGNOSIS — K219 Gastro-esophageal reflux disease without esophagitis: Secondary | ICD-10-CM | POA: Diagnosis present

## 2018-08-20 DIAGNOSIS — Z8249 Family history of ischemic heart disease and other diseases of the circulatory system: Secondary | ICD-10-CM | POA: Diagnosis not present

## 2018-08-20 DIAGNOSIS — Z91158 Patient's noncompliance with renal dialysis for other reason: Secondary | ICD-10-CM

## 2018-08-20 DIAGNOSIS — I2510 Atherosclerotic heart disease of native coronary artery without angina pectoris: Secondary | ICD-10-CM | POA: Diagnosis present

## 2018-08-20 DIAGNOSIS — I252 Old myocardial infarction: Secondary | ICD-10-CM | POA: Diagnosis not present

## 2018-08-20 DIAGNOSIS — Z66 Do not resuscitate: Secondary | ICD-10-CM | POA: Diagnosis present

## 2018-08-20 DIAGNOSIS — I5043 Acute on chronic combined systolic (congestive) and diastolic (congestive) heart failure: Secondary | ICD-10-CM | POA: Diagnosis present

## 2018-08-20 DIAGNOSIS — N186 End stage renal disease: Secondary | ICD-10-CM | POA: Diagnosis present

## 2018-08-20 DIAGNOSIS — I5033 Acute on chronic diastolic (congestive) heart failure: Secondary | ICD-10-CM | POA: Diagnosis not present

## 2018-08-20 DIAGNOSIS — D631 Anemia in chronic kidney disease: Secondary | ICD-10-CM

## 2018-08-20 DIAGNOSIS — E785 Hyperlipidemia, unspecified: Secondary | ICD-10-CM | POA: Diagnosis present

## 2018-08-20 DIAGNOSIS — Z8673 Personal history of transient ischemic attack (TIA), and cerebral infarction without residual deficits: Secondary | ICD-10-CM | POA: Diagnosis not present

## 2018-08-20 DIAGNOSIS — E875 Hyperkalemia: Secondary | ICD-10-CM

## 2018-08-20 DIAGNOSIS — I5042 Chronic combined systolic (congestive) and diastolic (congestive) heart failure: Secondary | ICD-10-CM | POA: Diagnosis present

## 2018-08-20 DIAGNOSIS — J9602 Acute respiratory failure with hypercapnia: Secondary | ICD-10-CM | POA: Diagnosis present

## 2018-08-20 DIAGNOSIS — G9341 Metabolic encephalopathy: Secondary | ICD-10-CM | POA: Diagnosis present

## 2018-08-20 DIAGNOSIS — Z992 Dependence on renal dialysis: Secondary | ICD-10-CM | POA: Diagnosis not present

## 2018-08-20 DIAGNOSIS — R0689 Other abnormalities of breathing: Secondary | ICD-10-CM | POA: Diagnosis not present

## 2018-08-20 DIAGNOSIS — N051 Unspecified nephritic syndrome with focal and segmental glomerular lesions: Secondary | ICD-10-CM | POA: Diagnosis present

## 2018-08-20 DIAGNOSIS — R0602 Shortness of breath: Secondary | ICD-10-CM | POA: Diagnosis present

## 2018-08-20 DIAGNOSIS — Z79899 Other long term (current) drug therapy: Secondary | ICD-10-CM | POA: Diagnosis not present

## 2018-08-20 DIAGNOSIS — I639 Cerebral infarction, unspecified: Secondary | ICD-10-CM | POA: Diagnosis present

## 2018-08-20 DIAGNOSIS — I132 Hypertensive heart and chronic kidney disease with heart failure and with stage 5 chronic kidney disease, or end stage renal disease: Secondary | ICD-10-CM | POA: Diagnosis present

## 2018-08-20 LAB — BASIC METABOLIC PANEL
Anion gap: 32 — ABNORMAL HIGH (ref 5–15)
Anion gap: 29 — ABNORMAL HIGH (ref 5–15)
BUN: 187 mg/dL — AB (ref 6–20)
BUN: 191 mg/dL — ABNORMAL HIGH (ref 6–20)
CALCIUM: 8.3 mg/dL — AB (ref 8.9–10.3)
CO2: 14 mmol/L — ABNORMAL LOW (ref 22–32)
CO2: 15 mmol/L — ABNORMAL LOW (ref 22–32)
CREATININE: 43.96 mg/dL — AB (ref 0.61–1.24)
Calcium: 9 mg/dL (ref 8.9–10.3)
Chloride: 93 mmol/L — ABNORMAL LOW (ref 98–111)
Chloride: 93 mmol/L — ABNORMAL LOW (ref 98–111)
Creatinine, Ser: 43.68 mg/dL — ABNORMAL HIGH (ref 0.61–1.24)
GFR calc Af Amer: 1 mL/min — ABNORMAL LOW (ref >60)
GFR calc Af Amer: 1 mL/min — ABNORMAL LOW (ref >60)
GFR calc non Af Amer: 1 mL/min — ABNORMAL LOW (ref >60)
GFR calc non Af Amer: 1 mL/min — ABNORMAL LOW (ref >60)
Glucose, Bld: 113 mg/dL — ABNORMAL HIGH (ref 70–99)
Glucose, Bld: 231 mg/dL — ABNORMAL HIGH (ref 70–99)
Potassium: >7.5 mmol/L (ref 3.5–5.1)
Potassium: 7.4 mmol/L (ref 3.5–5.1)
SODIUM: 139 mmol/L (ref 135–145)
Sodium: 137 mmol/L (ref 135–145)

## 2018-08-20 LAB — PREPARE RBC (CROSSMATCH)

## 2018-08-20 LAB — CBC
HCT: 18.5 % — ABNORMAL LOW (ref 39.0–52.0)
HCT: 18 % — ABNORMAL LOW (ref 39.0–52.0)
Hemoglobin: 6 g/dL — CL (ref 13.0–17.0)
Hemoglobin: 6.2 g/dL — CL (ref 13.0–17.0)
MCH: 28.3 pg (ref 26.0–34.0)
MCH: 28.4 pg (ref 26.0–34.0)
MCHC: 32.4 g/dL (ref 30.0–36.0)
MCHC: 34.4 g/dL (ref 30.0–36.0)
MCV: 87.3 fL (ref 80.0–100.0)
MCV: 82.6 fL (ref 80.0–100.0)
Platelets: 157 10*3/uL (ref 150–400)
Platelets: 146 10*3/uL — ABNORMAL LOW (ref 150–400)
RBC: 2.12 MIL/uL — ABNORMAL LOW (ref 4.22–5.81)
RBC: 2.18 MIL/uL — ABNORMAL LOW (ref 4.22–5.81)
RDW: 13.2 % (ref 11.5–15.5)
RDW: 12.9 % (ref 11.5–15.5)
WBC: 8.1 10*3/uL (ref 4.0–10.5)
WBC: 8.1 10*3/uL (ref 4.0–10.5)
nRBC: 0 % (ref 0.0–0.2)
nRBC: 0 % (ref 0.0–0.2)

## 2018-08-20 LAB — RETICULOCYTES
Immature Retic Fract: 4.5 % (ref 2.3–15.9)
RBC.: 1.81 MIL/uL — AB (ref 4.22–5.81)
Retic Count, Absolute: 34.4 10*3/uL (ref 19.0–186.0)
Retic Ct Pct: 1.9 % (ref 0.4–3.1)

## 2018-08-20 LAB — I-STAT TROPONIN, ED: Troponin i, poc: 3.69 ng/mL (ref 0.00–0.08)

## 2018-08-20 LAB — CBG MONITORING, ED: Glucose-Capillary: 245 mg/dL — ABNORMAL HIGH (ref 70–99)

## 2018-08-20 LAB — IRON AND TIBC
Iron: 115 ug/dL (ref 45–182)
Saturation Ratios: 63 % — ABNORMAL HIGH (ref 17.9–39.5)
TIBC: 183 ug/dL — ABNORMAL LOW (ref 250–450)
UIBC: 68 ug/dL

## 2018-08-20 LAB — VITAMIN B12: Vitamin B-12: 1638 pg/mL — ABNORMAL HIGH (ref 180–914)

## 2018-08-20 LAB — FOLATE: Folate: 14.9 ng/mL (ref >5.9)

## 2018-08-20 LAB — FERRITIN: Ferritin: 2105 ng/mL — ABNORMAL HIGH (ref 24–336)

## 2018-08-20 MED ORDER — DEXTROSE 50 % IV SOLN: 1.0000 | Freq: Once | INTRAVENOUS | Status: DC

## 2018-08-20 MED ORDER — SEVELAMER CARBONATE 800 MG PO TABS: 1600.0000 mg | ORAL_TABLET | ORAL | Status: DC | PRN

## 2018-08-20 MED ORDER — INSULIN ASPART 100 UNIT/ML IV SOLN
5.0000 [IU] | Freq: Once | INTRAVENOUS | Status: AC
  Administered 2018-08-20: 5 [IU] via INTRAVENOUS

## 2018-08-20 MED ORDER — SEVELAMER CARBONATE 800 MG PO TABS
1600.0000 mg | ORAL_TABLET | Freq: Three times a day (TID) | ORAL | Status: DC
  Administered 2018-08-21: 1600 mg via ORAL
  Filled 2018-08-20 (×2): qty 2

## 2018-08-20 MED ORDER — FUROSEMIDE 10 MG/ML IJ SOLN
20.0000 mg | Freq: Once | INTRAMUSCULAR | Status: AC
  Administered 2018-08-21: 20 mg via INTRAVENOUS
  Filled 2018-08-20: qty 2

## 2018-08-20 MED ORDER — DEXTROSE 10 % IV SOLN
Freq: Once | INTRAVENOUS | Status: AC
  Administered 2018-08-20: 17:00:00 via INTRAVENOUS

## 2018-08-20 MED ORDER — PENTAFLUOROPROP-TETRAFLUOROETH EX AERO
1.0000 "application " | INHALATION_SPRAY | CUTANEOUS | Status: DC | PRN
  Filled 2018-08-20: qty 116

## 2018-08-20 MED ORDER — LORAZEPAM 2 MG/ML IJ SOLN: 2.0000 mg | Freq: Four times a day (QID) | INTRAMUSCULAR | Status: DC | PRN

## 2018-08-20 MED ORDER — ALBUTEROL SULFATE (2.5 MG/3ML) 0.083% IN NEBU
10.0000 mg | INHALATION_SOLUTION | Freq: Once | RESPIRATORY_TRACT | Status: AC
  Administered 2018-08-20: 10 mg via RESPIRATORY_TRACT
  Filled 2018-08-20: qty 12

## 2018-08-20 MED ORDER — SODIUM BICARBONATE 8.4 % IV SOLN: 50.0000 meq | Freq: Once | INTRAVENOUS | Status: DC

## 2018-08-20 MED ORDER — ACETAMINOPHEN 650 MG RE SUPP: 650.0000 mg | Freq: Four times a day (QID) | RECTAL | Status: DC | PRN

## 2018-08-20 MED ORDER — CALCIUM GLUCONATE 10 % IV SOLN: 1.0000 g | Freq: Once | INTRAVENOUS | Status: DC

## 2018-08-20 MED ORDER — INSULIN ASPART 100 UNIT/ML IV SOLN: 5.0000 [IU] | Freq: Once | INTRAVENOUS | Status: DC

## 2018-08-20 MED ORDER — SODIUM CHLORIDE 0.9% IV SOLUTION: Freq: Once | INTRAVENOUS | Status: DC

## 2018-08-20 MED ORDER — HEPARIN SODIUM (PORCINE) 1000 UNIT/ML DIALYSIS
1000.0000 [IU] | INTRAMUSCULAR | Status: DC | PRN
  Filled 2018-08-20: qty 1

## 2018-08-20 MED ORDER — HEPARIN SODIUM (PORCINE) 1000 UNIT/ML IJ SOLN
INTRAMUSCULAR | Status: AC
  Filled 2018-08-20: qty 3

## 2018-08-20 MED ORDER — CARVEDILOL 25 MG PO TABS
25.0000 mg | ORAL_TABLET | Freq: Two times a day (BID) | ORAL | Status: DC
  Administered 2018-08-21: 25 mg via ORAL
  Filled 2018-08-20: qty 2
  Filled 2018-08-20: qty 1

## 2018-08-20 MED ORDER — ALBUTEROL SULFATE (2.5 MG/3ML) 0.083% IN NEBU: 10.0000 mg | INHALATION_SOLUTION | Freq: Once | RESPIRATORY_TRACT | Status: DC

## 2018-08-20 MED ORDER — CLONIDINE HCL 0.2 MG PO TABS
0.2000 mg | ORAL_TABLET | Freq: Two times a day (BID) | ORAL | Status: DC
  Administered 2018-08-21 (×2): 0.2 mg via ORAL
  Filled 2018-08-20 (×2): qty 1

## 2018-08-20 MED ORDER — HEPARIN SODIUM (PORCINE) 5000 UNIT/ML IJ SOLN: 5000.0000 [IU] | Freq: Three times a day (TID) | INTRAMUSCULAR | Status: DC

## 2018-08-20 MED ORDER — PANTOPRAZOLE SODIUM 40 MG PO TBEC: 40.0000 mg | DELAYED_RELEASE_TABLET | Freq: Every day | ORAL | Status: DC

## 2018-08-20 MED ORDER — HEPARIN SODIUM (PORCINE) 1000 UNIT/ML DIALYSIS
40.0000 [IU]/kg | Freq: Once | INTRAMUSCULAR | Status: AC
  Administered 2018-08-20: 2700 [IU] via INTRAVENOUS_CENTRAL
  Filled 2018-08-20: qty 3

## 2018-08-20 MED ORDER — SODIUM ZIRCONIUM CYCLOSILICATE 10 G PO PACK
10.0000 g | PACK | Freq: Two times a day (BID) | ORAL | Status: DC
  Filled 2018-08-20 (×2): qty 1

## 2018-08-20 MED ORDER — SENNOSIDES-DOCUSATE SODIUM 8.6-50 MG PO TABS: 1.0000 | ORAL_TABLET | Freq: Every evening | ORAL | Status: DC | PRN

## 2018-08-20 MED ORDER — DEXTROSE 50 % IV SOLN
1.0000 | Freq: Once | INTRAVENOUS | Status: AC
  Administered 2018-08-20: 50 mL via INTRAVENOUS
  Filled 2018-08-20: qty 50

## 2018-08-20 MED ORDER — CALCITRIOL 0.5 MCG PO CAPS: 0.5000 ug | ORAL_CAPSULE | Freq: Every day | ORAL | Status: DC

## 2018-08-20 MED ORDER — VITAMIN D 25 MCG (1000 UNIT) PO TABS
1000.0000 [IU] | ORAL_TABLET | Freq: Two times a day (BID) | ORAL | Status: DC
  Administered 2018-08-21: 1000 [IU] via ORAL
  Filled 2018-08-20: qty 1

## 2018-08-20 MED ORDER — SODIUM CHLORIDE 0.9 % IV SOLN: 100.0000 mL | INTRAVENOUS | Status: DC | PRN

## 2018-08-20 MED ORDER — ATORVASTATIN CALCIUM 40 MG PO TABS
40.0000 mg | ORAL_TABLET | Freq: Every day | ORAL | Status: DC
  Administered 2018-08-21 (×2): 40 mg via ORAL
  Filled 2018-08-20 (×2): qty 1

## 2018-08-20 MED ORDER — ASPIRIN EC 81 MG PO TBEC: 81.0000 mg | DELAYED_RELEASE_TABLET | Freq: Every day | ORAL | Status: DC

## 2018-08-20 MED ORDER — LIDOCAINE HCL (PF) 1 % IJ SOLN: 5.0000 mL | INTRAMUSCULAR | Status: DC | PRN

## 2018-08-20 MED ORDER — SODIUM POLYSTYRENE SULFONATE 15 GM/60ML PO SUSP: 30.0000 g | Freq: Once | ORAL | Status: DC

## 2018-08-20 MED ORDER — ACETAMINOPHEN 325 MG PO TABS: 650.0000 mg | ORAL_TABLET | Freq: Four times a day (QID) | ORAL | Status: DC | PRN

## 2018-08-20 MED ORDER — ALTEPLASE 2 MG IJ SOLR
2.0000 mg | Freq: Once | INTRAMUSCULAR | Status: DC | PRN
  Filled 2018-08-20: qty 2

## 2018-08-20 MED ORDER — CHLORHEXIDINE GLUCONATE CLOTH 2 % EX PADS: 6.0000 | MEDICATED_PAD | Freq: Every day | CUTANEOUS | Status: DC

## 2018-08-20 MED ORDER — HYDRALAZINE HCL 20 MG/ML IJ SOLN
10.0000 mg | Freq: Four times a day (QID) | INTRAMUSCULAR | Status: DC | PRN
  Filled 2018-08-20: qty 1

## 2018-08-20 MED ORDER — LIDOCAINE-PRILOCAINE 2.5-2.5 % EX CREA
1.0000 "application " | TOPICAL_CREAM | CUTANEOUS | Status: DC | PRN
  Filled 2018-08-20: qty 5

## 2018-08-20 MED ORDER — HYDRALAZINE HCL 50 MG PO TABS
100.0000 mg | ORAL_TABLET | Freq: Three times a day (TID) | ORAL | Status: DC
  Administered 2018-08-21 (×3): 100 mg via ORAL
  Filled 2018-08-20 (×3): qty 2

## 2018-08-20 NOTE — ED Notes (Addendum)
Please note: this tech was only able to acquire 1 blue top, 1 gold top, 1 light green, and 2 lavender.

## 2018-08-20 NOTE — ED Notes (Signed)
Pt suddenly very lethargic and arousable to obnoxious stimuli. Dr Tyrone Nine notified and in room. Pt placed on NRB then nebulizer started. Pt's spo2 in the mid 80s. Pt more alert after nebulizer started, now axox4.

## 2018-08-20 NOTE — ED Triage Notes (Addendum)
Came on 1/11,  States left AMA   No dialysis since christmas labs were abnormal

## 2018-08-20 NOTE — ED Provider Notes (Signed)
[facility] Provider Note   CSN: [id number] Arrival date & time: 08/20/18  1423     History   Chief Complaint Chief Complaint  Patient presents with  . Shortness of Breath  . Vascular Access Problem    HPI Angel [name] is a 48 y.o. male.  48 yo M with a chief complaint of shortness of breath.  This is been going on for the past week or so.  Corresponds with missed dialysis sessions.  Patient goes into a long discussion of why he was unable to make it to dialysis.  Sounds like he attempted to go this morning but was unable to make it because he arrived too late.  Last time he had a full session a estimates was about 4 days ago.  Feels like he is gained some weight with this.  Has had some sharp chest pain off and on that improves with exertion.  He also was here about 3 days ago and was sent from his dialysis clinic for transfusion however he said he got angry and decided not to stay after his first unit of blood.  He denies any dark stool or blood in his stool denies hemoptysis.  The history is provided by the patient.  Shortness of Breath  Severity:  Moderate Onset quality:  Gradual Duration:  1 week Timing:  Constant Progression:  Worsening Chronicity:  New Context: activity   Relieved by:  Rest Worsened by:  Nothing Ineffective treatments:  None tried Associated symptoms: chest pain   Associated symptoms: no abdominal pain, no fever, no headaches, no rash and no vomiting     Past Medical History:  Diagnosis Date  . AAA (abdominal aortic aneurysm) ([place])   . Chronic anemia   . Chronic combined systolic and diastolic CHF (congestive heart failure) ([place])   . End stage renal disease ([place])   . ESRD on dialysis ([place])   . FSGS (focal segmental glomerulosclerosis)   . GERD (gastroesophageal reflux disease)   . HLD (hyperlipidemia)   . Hypertension   . NSTEMI (non-ST elevated myocardial infarction) ([place]) 07/2017   pt refused cath   . Pericardial effusion    a. small by echo 06/2017.  Marland Kitchen Renal disorder   . Seizure ([place])   . Stroke (cerebrum) [facility])     Patient Active Problem List   Diagnosis Date Noted  . Acute hypercapnic respiratory failure ([place]) 08/20/2018  . Anemia 08/17/2018  . Acute renal failure (ARF) ([place]) 04/03/2018  . Chronic combined systolic and diastolic CHF (congestive heart failure) ([place]) 08/14/2017  . Thrombocytopenia ([place]) 07/30/2017  . Dialysis patient, noncompliant ([place])   . HTN (hypertension) 07/13/2017  . Non-ST elevation (NSTEMI) myocardial infarction ([place])   . Elevated troponin   . Hyperkalemia 06/20/2017  . GERD (gastroesophageal reflux disease) 06/20/2017  . Seizure disorder ([place]) 06/20/2017  . Abnormal LFTs 06/20/2017  . Anemia in ESRD (end-stage renal disease) ([place]) 06/20/2017  . HLD (hyperlipidemia)   . Stroke (cerebrum) ([place])   . ESRD (end stage renal disease) on dialysis ([place])   . Acute on chronic diastolic CHF (congestive heart failure) ([place])   . Acute post-operative pain 02/14/2017  . Acute respiratory insufficiency 02/14/2017  . Febrile nonhemolytic transfusion reaction 02/13/2017  . FSGS (focal segmental glomerulosclerosis) 02/06/2017  . History of stroke 09/13/2016  . Palpitations 09/13/2016  . Abdominal aortic aneurysm (AAA) without rupture ([place]) 07/21/2016  . 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 Pre-transplant evaluation for kidney transplant  03/20/2016    Past Surgical History:  Procedure Laterality Date  . ABDOMINAL AORTIC ANEURYSM REPAIR    . KIDNEY SURGERY          Home Medications    Prior to Admission medications   Medication Sig Start Date End Date Taking? Authorizing Provider  amLODipine (NORVASC) 10 MG tablet Take 10 mg by mouth daily.    [provider]  aspirin EC 81 MG tablet Take 81 mg daily by mouth.    [provider]  atorvastatin (LIPITOR) 40 MG tablet Take 40 mg at bedtime by mouth.    [provider]    calcitRIOL (ROCALTROL) 0.5 MCG capsule Take 0.5 mcg by mouth daily.     [provider]  carvedilol (COREG) 25 MG tablet Take 25 mg 2 (two) times daily with a meal by mouth.    [provider]  cholecalciferol (VITAMIN D) 1000 units tablet Take 1,000 Units by mouth 2 (two) times daily.    [provider]  cloNIDine (CATAPRES) 0.2 MG tablet Take 0.2 mg by mouth 2 (two) times daily. 10/19/17   [provider]  furosemide (LASIX) 80 MG tablet Take 80 mg at bedtime by mouth.    [provider]  hydrALAZINE (APRESOLINE) 100 MG tablet Take 100 mg by mouth 3 (three) times daily.    [provider]  hydrOXYzine (ATARAX/VISTARIL) 10 MG tablet Take 10 mg by mouth daily as needed for itching or anxiety. Take 1 tablet daily as needed     [provider]  irbesartan (AVAPRO) 150 MG tablet Take 150 mg by mouth daily.    [provider]  Lactobacillus Rhamnosus, GG, (CULTURELLE) CAPS Take 1 capsule by mouth daily.    [provider]  multivitamin (RENA-VIT) TABS tablet Take 1 tablet by mouth 2 (two) times daily.    [provider]  Omega-3 Fatty Acids (OMEGA III EPA+DHA) 1000 MG CAPS     [provider]  pantoprazole (PROTONIX) 40 MG tablet Take 40 mg by mouth daily.  07/03/16   [provider]  sevelamer carbonate (RENVELA) 800 MG tablet Take 1,600 mg by mouth See admin instructions. Take 2 tablets (1600 mg) three times daily with meals and with snacks    [provider]  WHITE PETROLATUM-MINERAL OIL OP Apply 1 application topically 2 (two) times daily as needed (itching).  03/12/17   [provider]    Family History Family History  Problem Relation Age of Onset  . Hypertension Mother   . Kidney disease Mother   . Asthma Father   . Hypertension Father   . Kidney disease Father   . Hypertension Sister   . Heart disease Sister   . Kidney disease Sister   . Heart disease Brother    . Hypertension Brother   . COPD Brother     Social History Social History   Tobacco Use  . Smoking status: Never Smoker  . Smokeless tobacco: Never Used  Substance Use Topics  . Alcohol use: No    Frequency: Never  . Drug use: No     Allergies   Clonidine derivatives and Hydralazine hcl   Review of Systems Review of Systems  Constitutional: Negative for chills and fever.  HENT: Negative for congestion and facial swelling.   Eyes: Negative for discharge and visual disturbance.  Respiratory: Positive for shortness of breath.   Cardiovascular: Positive for chest pain. Negative for palpitations.  Gastrointestinal: Negative for abdominal pain, diarrhea and  vomiting.  Musculoskeletal: Negative for arthralgias and myalgias.  Skin: Negative for color change and rash.  Neurological: Negative for tremors, syncope and headaches.  Psychiatric/Behavioral: Negative for confusion and dysphoric mood.     Physical Exam Updated Vital Signs BP (!) 143/107   Pulse 79   Temp 97.8 F (36.6 C)   Resp (!) 0   SpO2 96%   Physical Exam Vitals signs and nursing note reviewed.  Constitutional:      Appearance: He is well-developed.  HENT:     Head: Normocephalic and atraumatic.  Eyes:     Pupils: Pupils are equal, round, and reactive to light.  Neck:     Musculoskeletal: Normal range of motion and neck supple.     Vascular: JVD (to the angle of the jaw) present.  Cardiovascular:     Rate and Rhythm: Normal rate and regular rhythm.     Heart sounds: No murmur. No friction rub. No gallop.   Pulmonary:     Effort: No respiratory distress.     Breath sounds: Rales (faint at bases) present. No wheezing.     Comments: Tunneled catheter to the right chest wall without erythema or drainage at the site. Abdominal:     General: There is no distension.     Tenderness: There is no guarding or rebound.  Musculoskeletal: Normal range of motion.  Skin:    Coloration: Skin is not pale.      Findings: No rash.  Neurological:     Mental Status: He is alert and oriented to person, place, and time.  Psychiatric:        Behavior: Behavior normal.      ED Treatments / Results  Labs (all labs ordered are listed, but only abnormal results are displayed) Labs Reviewed  BASIC METABOLIC PANEL - Abnormal; Notable for the following components:      Result Value   Potassium >7.5 (*)    Chloride 93 (*)    CO2 14 (*)    Glucose, Bld 113 (*)    BUN 187 (*)    Creatinine, Ser 43.68 (*)    GFR calc non Af Amer 1 (*)    GFR calc Af Amer 1 (*)    Anion gap 32 (*)    All other components within normal limits  CBC - Abnormal; Notable for the following components:   RBC 2.12 (*)    Hemoglobin 6.0 (*)    HCT 18.5 (*)    All other components within normal limits  I-STAT TROPONIN, ED - Abnormal; Notable for the following components:   Troponin i, poc 3.69 (*)    All other components within normal limits  CBG MONITORING, ED - Abnormal; Notable for the following components:   Glucose-Capillary 245 (*)    All other components within normal limits  VITAMIN B12  FOLATE  IRON AND TIBC  FERRITIN  RETICULOCYTES  BASIC METABOLIC PANEL  POTASSIUM  POTASSIUM  POTASSIUM  NA AND K (SODIUM & POTASSIUM), RAND UR  BLOOD GAS, VENOUS  TYPE AND SCREEN  PREPARE RBC (CROSSMATCH)    EKG EKG Interpretation  Date/Time:  Tuesday August 20 2018 14:38:40 EST Ventricular Rate:  99 PR Interval:  174 QRS Duration: 110 QT Interval:  386 QTC Calculation: 495 R Axis:     Text Interpretation:  Normal sinus rhythm Nonspecific T wave abnormality Prolonged QT Abnormal ECG flipped t waves laterally seen on prior Otherwise no significant change Confirmed by Deno Etienne [id number]) on 08/20/2018 3:41:00 PM  Radiology Dg Chest 2 View  Result Date: 08/20/2018 CLINICAL DATA:  Shortness of breath, missed dialysis therapy EXAM: CHEST - 2 VIEW COMPARISON:  04/03/2018 FINDINGS: Cardiac shadow remains enlarged.  Dialysis catheter is again noted and stable. The lungs are well aerated bilaterally. No significant vascular congestion is seen. Patchy infiltrate in the right lung base posteriorly is noted. No acute bony abnormality is noted. IMPRESSION: Patchy right basilar infiltrate. No evidence of volume overload. Electronically Signed   By: Inez Catalina M.D.   On: 08/20/2018 15:15    Procedures Procedures (including critical care time)  Medications Ordered in ED Medications  0.9 %  sodium chloride infusion (Manually program via Guardrails IV Fluids) (has no administration in time range)  LORazepam (ATIVAN) injection 2 mg (has no administration in time range)  aspirin EC tablet 81 mg (has no administration in time range)  atorvastatin (LIPITOR) tablet 40 mg (has no administration in time range)  calcitRIOL (ROCALTROL) capsule 0.5 mcg (has no administration in time range)  cloNIDine (CATAPRES) tablet 0.2 mg (has no administration in time range)  hydrALAZINE (APRESOLINE) tablet 100 mg (has no administration in time range)  sevelamer carbonate (RENVELA) tablet 1,600 mg (has no administration in time range)  pantoprazole (PROTONIX) EC tablet 40 mg (has no administration in time range)  cholecalciferol (VITAMIN D) tablet 1,000 Units (has no administration in time range)  carvedilol (COREG) tablet 25 mg (has no administration in time range)  hydrALAZINE (APRESOLINE) injection 10 mg (has no administration in time range)  Chlorhexidine Gluconate Cloth 2 % PADS 6 each (has no administration in time range)  calcium gluconate inj 10% (1 g) URGENT USE ONLY! (has no administration in time range)  albuterol (PROVENTIL) (2.5 MG/3ML) 0.083% nebulizer solution 10 mg (has no administration in time range)  insulin aspart (novoLOG) injection 5 Units (has no administration in time range)    And  dextrose 50 % solution 50 mL (has no administration in time range)  sodium bicarbonate injection 50 mEq (has no administration  in time range)  sodium polystyrene (KAYEXALATE) 15 GM/60ML suspension 30 g (has no administration in time range)  sodium zirconium cyclosilicate (LOKELMA) packet 10 g (has no administration in time range)  albuterol (PROVENTIL) (2.5 MG/3ML) 0.083% nebulizer solution 10 mg (10 mg Nebulization Given 08/20/18 1711)  insulin aspart (novoLOG) injection 5 Units (5 Units Intravenous Given 08/20/18 1703)    And  dextrose 50 % solution 50 mL (50 mLs Intravenous Given 08/20/18 1704)  dextrose 10 % infusion ( Intravenous New Bag/Given 08/20/18 1706)     Initial Impression / Assessment and Plan / ED Course  I have reviewed the triage vital signs and the nursing notes.  Pertinent labs & imaging results that were available during my care of the patient were reviewed by me and considered in my medical decision making (see chart for details).     48 yo M poorly compliant with his dialysis is here with a chief complaint of shortness of breath.  The patient had a troponin performed in triage that was 3.69.  He describes very atypical chest pain that sharp and seems improved with exertion.  I suspect that it is likely elevated secondary to fluid overload.  I discussed the case with nephrology as his potassium is greater than 7.5 and he has a significant uremia.  They recommended admission to the hospital and recommended to be recontacted once he was placed into the room.  I will acutely temporize his potassium with albuterol and  insulin.  The patient is also anemic which seems to be anemia of chronic disease.  I will order him 2 units of blood.  I discussed the case with the hospitalist will admit.  I was called to the patient's bedside because he had some decreased responsiveness.  The patient will awake spontaneously and carry on a conversation with me.  Seems somewhat sleepy.  The patient's blood sugar was rechecked and was normal.  I wonder if the patient took a medication that make him sleepy.  He states sometimes  that his blood pressure medication does this to him.  We will check a VBG.  CRITICAL CARE Performed by: Cecilio Asper   Total critical care time: 80 minutes  Critical care time was exclusive of separately billable procedures and treating other patients.  Critical care was necessary to treat or prevent imminent or life-threatening deterioration.  Critical care was time spent personally by me on the following activities: development of treatment plan with patient and/or surrogate as well as nursing, discussions with consultants, evaluation of patient's response to treatment, examination of patient, obtaining history from patient or surrogate, ordering and performing treatments and interventions, ordering and review of laboratory studies, ordering and review of radiographic studies, pulse oximetry and re-evaluation of patient's condition.  The patients results and plan were reviewed and discussed.   Any x-rays performed were independently reviewed by myself.   Differential diagnosis were considered with the presenting HPI.  Medications  0.9 %  sodium chloride infusion (Manually program via Guardrails IV Fluids) (has no administration in time range)  LORazepam (ATIVAN) injection 2 mg (has no administration in time range)  aspirin EC tablet 81 mg (has no administration in time range)  atorvastatin (LIPITOR) tablet 40 mg (has no administration in time range)  calcitRIOL (ROCALTROL) capsule 0.5 mcg (has no administration in time range)  cloNIDine (CATAPRES) tablet 0.2 mg (has no administration in time range)  hydrALAZINE (APRESOLINE) tablet 100 mg (has no administration in time range)  sevelamer carbonate (RENVELA) tablet 1,600 mg (has no administration in time range)  pantoprazole (PROTONIX) EC tablet 40 mg (has no administration in time range)  cholecalciferol (VITAMIN D) tablet 1,000 Units (has no administration in time range)  carvedilol (COREG) tablet 25 mg (has no administration  in time range)  hydrALAZINE (APRESOLINE) injection 10 mg (has no administration in time range)  Chlorhexidine Gluconate Cloth 2 % PADS 6 each (has no administration in time range)  calcium gluconate inj 10% (1 g) URGENT USE ONLY! (has no administration in time range)  albuterol (PROVENTIL) (2.5 MG/3ML) 0.083% nebulizer solution 10 mg (has no administration in time range)  insulin aspart (novoLOG) injection 5 Units (has no administration in time range)    And  dextrose 50 % solution 50 mL (has no administration in time range)  sodium bicarbonate injection 50 mEq (has no administration in time range)  sodium polystyrene (KAYEXALATE) 15 GM/60ML suspension 30 g (has no administration in time range)  sodium zirconium cyclosilicate (LOKELMA) packet 10 g (has no administration in time range)  albuterol (PROVENTIL) (2.5 MG/3ML) 0.083% nebulizer solution 10 mg (10 mg Nebulization Given 08/20/18 1711)  insulin aspart (novoLOG) injection 5 Units (5 Units Intravenous Given 08/20/18 1703)    And  dextrose 50 % solution 50 mL (50 mLs Intravenous Given 08/20/18 1704)  dextrose 10 % infusion ( Intravenous New Bag/Given 08/20/18 1706)    Vitals:   08/20/18 1615 08/20/18 1630 08/20/18 1645 08/20/18 1700  BP:  (!) 138/109 Marland Kitchen)  137/106 (!) 143/107  Pulse: 96 87 86 79  Resp: (!) 21 16 (!) 28 (!) 0  Temp:      SpO2: 99% 93% (!) 86% 96%    Final diagnoses:  Hyperkalemia  Anemia due to chronic kidney disease, on chronic dialysis ([name])  SOB (shortness of breath)    Admission/ observation were discussed with the admitting physician, patient and/or family and they are comfortable with the plan.   Final Clinical Impressions(s) / ED Diagnoses   Final diagnoses:  Hyperkalemia  Anemia due to chronic kidney disease, on chronic dialysis ([name])  SOB (shortness of breath)    ED Discharge Orders    None       Deno Etienne, DO 08/20/18 1723

## 2018-08-20 NOTE — H&P (Signed)
TRH H&P   Patient Demographics:    Angel [name], is a 48 y.o. male  MRN: [id number]   DOB - 1970-12-25  Admit Date - 08/20/2018  Outpatient Primary MD for the patient is Gwenlyn Saran, MD    Patient coming from: [place]  Chief Complaint  Patient presents with  . Shortness of Breath  . Vascular Access Problem      HPI:    Angel [name]  is a 48 y.o. male, with history of ESRD and severe noncompliance, multiple AMA signouts, HTN, dyslipidemia, AAA, anemia of chronic disease, chronic combined systolic and diastolic CHF, non-STEMI patient refused cath in the past who was in the hospital few days ago on 08/17/2018 after missing almost 4 weeks of dialysis and was scheduled for urgent dialysis on 08/17/2018 at that time he signed himself out of the [facility], now presents to the hospital with shortness of breath.    Patient in the ER is mildly short of breath, he also has mild orthopnea, no chest or abdominal pain, he feels restless, ER work-up suggestive of poorly managed ESRD with extremely high BUN/creatinine and potassium, elevated troponin due to ESRD with missed dialysis, he also has evidence of uremic frost on physical exam along with mild uremic encephalopathy but still oriented x3.  Nephrology was consulted and I was requested to admit him for dialysis treatments.  Patient currently denies any headache fever chills, no chest pain no palpitations, no abdominal pain or diarrhea.  No focal weakness.    Review of systems:    A full 10 point Review of Systems was done, except as stated above, all other Review of Systems were negative.   With Past History of the following :    Past Medical History:    Diagnosis Date  . AAA (abdominal aortic aneurysm) ([place])   . Chronic anemia   . Chronic combined systolic and diastolic CHF (congestive heart failure) ([place])   . End stage renal disease ([place])   . ESRD on dialysis ([place])   . FSGS (focal segmental glomerulosclerosis)   . GERD (gastroesophageal reflux disease)   . HLD (hyperlipidemia)   . Hypertension   . NSTEMI (non-ST elevated myocardial infarction) ([place]) 07/2017   pt refused cath  . Pericardial effusion    a. small by echo 06/2017.  Marland Kitchen  Renal disorder   . Seizure ([place])   . Stroke (cerebrum) [facility])       Past Surgical History:  Procedure Laterality Date  . ABDOMINAL AORTIC ANEURYSM REPAIR    . KIDNEY SURGERY        Social History:     Social History   Tobacco Use  . Smoking status: Never Smoker  . Smokeless tobacco: Never Used  Substance Use Topics  . Alcohol use: No    Frequency: Never         Family History :     Family History  Problem Relation Age of Onset  . Hypertension Mother   . Kidney disease Mother   . Asthma Father   . Hypertension Father   . Kidney disease Father   . Hypertension Sister   . Heart disease Sister   . Kidney disease Sister   . Heart disease Brother   . Hypertension Brother   . COPD Brother        Home Medications:   Prior to Admission medications   Medication Sig Start Date End Date Taking? Authorizing Provider  amLODipine (NORVASC) 10 MG tablet Take 10 mg by mouth daily.    [provider]  aspirin EC 81 MG tablet Take 81 mg daily by mouth.    [provider]  atorvastatin (LIPITOR) 40 MG tablet Take 40 mg at bedtime by mouth.    [provider]  calcitRIOL (ROCALTROL) 0.5 MCG capsule Take 0.5 mcg by mouth daily.     [provider]  carvedilol (COREG) 25 MG tablet Take 25 mg 2 (two) times daily with a meal by mouth.    [provider]  cholecalciferol (VITAMIN D) 1000 units tablet Take 1,000 Units by mouth 2 (two) times daily.     [provider]  cloNIDine (CATAPRES) 0.2 MG tablet Take 0.2 mg by mouth 2 (two) times daily. 10/19/17   [provider]  furosemide (LASIX) 80 MG tablet Take 80 mg at bedtime by mouth.    [provider]  hydrALAZINE (APRESOLINE) 100 MG tablet Take 100 mg by mouth 3 (three) times daily.    [provider]  hydrOXYzine (ATARAX/VISTARIL) 10 MG tablet Take 10 mg by mouth daily as needed for itching or anxiety. Take 1 tablet daily as needed     [provider]  irbesartan (AVAPRO) 150 MG tablet Take 150 mg by mouth daily.    [provider]  Lactobacillus Rhamnosus, GG, (CULTURELLE) CAPS Take 1 capsule by mouth daily.    [provider]  multivitamin (RENA-VIT) TABS tablet Take 1 tablet by mouth 2 (two) times daily.    [provider]  Omega-3 Fatty Acids (OMEGA III EPA+DHA) 1000 MG CAPS     [provider]  pantoprazole (PROTONIX) 40 MG tablet Take 40 mg by mouth daily.  07/03/16   [provider]  sevelamer carbonate (RENVELA) 800 MG tablet Take 1,600 mg by mouth See admin instructions. Take 2 tablets (1600 mg) three times daily with meals and with snacks    [provider]  WHITE PETROLATUM-MINERAL OIL OP Apply 1 application topically 2 (two) times daily as needed (itching).  03/12/17   [provider]     Allergies:     Allergies  Allergen Reactions  . Clonidine Derivatives Swelling and Other (See Comments)    Leg swelling  . Hydralazine Hcl Swelling and Other (See Comments)    Leg swelling      Physical  Exam:   Vitals  Blood pressure (!) 179/125, pulse 96, temperature 97.8 F (36.6 C), resp. rate (!) 21, SpO2 99 %.   1. General middle-aged African-American male lying in hospital bed mildly restless and mildly short of breath,  2. Normal affect and insight, Not Suicidal or Homicidal, Awake slightly confused but still oriented X 3.  3. No F.N deficits, ALL C.Nerves Intact,  Strength 5/5 all 4 extremities, Sensation intact all 4 extremities, Plantars down going.  He does have some flapping tremors,  4. Ears and Eyes appear Normal, Conjunctivae clear, PERRLA. Moist Oral Mucosa.  5. Supple Neck, No JVD, No cervical lymphadenopathy appriciated, No Carotid Bruits.  6. Symmetrical Chest wall movement, Good air movement bilaterally, bibasilar crackles are positive.  7. RRR, No Gallops, Rubs or Murmurs, No Parasternal Heave.  8. Positive Bowel Sounds, Abdomen Soft, No tenderness, No organomegaly appriciated,No rebound -guarding or rigidity.  9.  No Cyanosis, Normal Skin Turgor, No Skin Rash or Bruise.  He does have evidence of uremic frost under his armpits and on his torso and somewhat in his thighs  10. Good muscle tone,  joints appear normal , no effusions, Normal ROM.  11. No Palpable Lymph Nodes in Neck or Axillae      Data Review:    CBC Recent Labs  Lab 08/17/18 0613 08/20/18 1457  WBC 8.3 8.1  HGB 5.0* 6.0*  HCT 15.2* 18.5*  PLT 174 157  MCV 88.4 87.3  MCH 29.1 28.3  MCHC 32.9 32.4  RDW 12.7 13.2   ------------------------------------------------------------------------------------------------------------------  Chemistries  Recent Labs  Lab 08/17/18 0613 08/20/18 1457  NA 141 139  K >7.5* >7.5*  CL 94* 93*  CO2 15* 14*  GLUCOSE 99 113*  BUN 171* 187*  CREATININE 38.66* 43.68*  CALCIUM 9.7 9.0  AST 26  --   ALT 24  --   ALKPHOS 101  --   BILITOT 0.8  --    ------------------------------------------------------------------------------------------------------------------ estimated creatinine clearance is 1.8 mL/min (A) (by C-G formula based on SCr of 43.68 mg/dL (H)). ------------------------------------------------------------------------------------------------------------------ No results for input(s): TSH, T4TOTAL, T3FREE, THYROIDAB in the last 72 hours.  Invalid input(s): FREET3  Coagulation profile No results for  input(s): INR, PROTIME in the last 168 hours. ------------------------------------------------------------------------------------------------------------------- No results for input(s): DDIMER in the last 72 hours. -------------------------------------------------------------------------------------------------------------------  Cardiac Enzymes No results for input(s): CKMB, TROPONINI, MYOGLOBIN in the last 168 hours.  Invalid input(s): CK ------------------------------------------------------------------------------------------------------------------ No results found for: BNP   ---------------------------------------------------------------------------------------------------------------  Urinalysis    Component Value Date/Time   COLORURINE YELLOW (A) 01/04/2018 1030   APPEARANCEUR CLEAR (A) 01/04/2018 1030   LABSPEC 1.004 (L) 01/04/2018 1030   PHURINE 9.0 (H) 01/04/2018 1030   GLUCOSEU NEGATIVE 01/04/2018 1030   HGBUR NEGATIVE 01/04/2018 1030   BILIRUBINUR NEGATIVE 01/04/2018 1030   [place] 01/04/2018 1030   PROTEINUR 100 (A) 01/04/2018 1030   NITRITE NEGATIVE 01/04/2018 1030   LEUKOCYTESUR NEGATIVE 01/04/2018 1030    ----------------------------------------------------------------------------------------------------------------   Imaging Results:    Dg Chest 2 View  Result Date: 08/20/2018 CLINICAL DATA:  Shortness of breath, missed dialysis therapy EXAM: CHEST - 2 VIEW COMPARISON:  04/03/2018 FINDINGS: Cardiac shadow remains enlarged. Dialysis catheter is again noted and stable. The lungs are well aerated bilaterally. No significant vascular congestion is seen. Patchy infiltrate in the right lung base posteriorly is noted. No acute bony abnormality is noted. IMPRESSION: Patchy right basilar infiltrate. No evidence of volume overload. Electronically Signed   By: Linus Mako.D.  On: 08/20/2018 15:15    My personal review of EKG: Rhythm NSR,   no Acute ST  changes   Assessment & Plan:     1.  Acute on chronic combined systolic diastolic heart failure EF 25 to 30% causing acute hypoxic respiratory failure due to fluid overload from multiple missed dialysis treatments.  Will be admitted to telemetry bed, case already discussed with nephrology will require multiple dialysis treatments starting immediately today, continue home blood pressure medications namely Coreg and hydralazine for now.  Due to renal function being poor and hyperkalemia will avoid ACE/ARB.  2.  Elevated troponin due to reduced clearance from missed ESRD treatments.  EKG nonacute, no chest pain, will trend.  Beta-blocker for now.  Not a NSTEMI.  3.  Severe hyperkalemia.  Ordered hyperkalemia protocol in the ER, there after dialysis.  Telemetry monitor.  Currently no peak T waves on EKG.  4.  Essential hypertension.  For now Coreg and hydralazine.  PRN IV hydralazine.  Monitor.  5.  History of CAD.  Outpatient cardiology follow-up, previously refused left heart cath and intervention.  Continue aspirin, beta-blocker and statin for secondary prevention.  6.  Dyslipidemia.  Home dose statin continued.  7.  Mild metabolic encephalopathy.  Still oriented x3, supportive care.  At risk for seizures.  PRN Ativan ordered for seizure only.  8.  Anemia of chronic disease.  Discussed with nephrologist, type screen, anemia panel.  Likely with fluid removal through HD anemia will improve.  If not will be transfused electively with HD treatment.    DVT Prophylaxis Heparin   AM Labs Ordered, also please review Full Orders  Family Communication: Admission, patients condition and plan of care including tests being ordered have been discussed with the patient   who indicates understanding and agree with the plan and Code Status.  Code Status DNR  Likely DC to  TBD  Condition GUARDED    Consults called: Renal    Admission status: Inpt    Time spent in minutes : 35   Lala Lund  M.D on 08/20/2018 at 4:56 PM  To page go to [email or web address] - password [facility]

## 2018-08-20 NOTE — ED Notes (Signed)
ED Provider at bedside. 

## 2018-08-20 NOTE — Consult Note (Signed)
Reason for Consult:Uremia, hyperkalemia Referring Physician: ED  Angel [name] is an 48 y.o. male.  HPI: 48 yr male with ESRD from FSGS (see note from Dr. Melvia Heaps 1/11).  Hx severe HTN, AAA post repair, EF 25%, severe anemia (not to HD to get ESA), Sz, CVA, MI, presents with SOB, N,V, feeling bad.  He cannot give hx as falls asleep mid sentence.  Has not been to dialysis in over a mon.  Kicked out of multiple units, supposed to go to [facility] to HD.  Paranoid regarding medical providers.  Refused HD on 1/11. Review of systems not obtained due to patient factors.   Dialyzes at [facility]   . Access [facility].  Past Medical History:  Diagnosis Date  . AAA (abdominal aortic aneurysm) ([place])   . Chronic anemia   . Chronic combined systolic and diastolic CHF (congestive heart failure) ([place])   . End stage renal disease ([place])   . ESRD on dialysis ([place])   . FSGS (focal segmental glomerulosclerosis)   . GERD (gastroesophageal reflux disease)   . HLD (hyperlipidemia)   . Hypertension   . NSTEMI (non-ST elevated myocardial infarction) ([place]) 07/2017   pt refused cath  . Pericardial effusion    a. small by echo 06/2017.  Marland Kitchen Renal disorder   . Seizure ([place])   . Stroke (cerebrum) [facility])     Past Surgical History:  Procedure Laterality Date  . ABDOMINAL AORTIC ANEURYSM REPAIR    . KIDNEY SURGERY      Family History  Problem Relation Age of Onset  . Hypertension Mother   . Kidney disease Mother   . Asthma Father   . Hypertension Father   . Kidney disease Father   . Hypertension Sister   . Heart disease Sister   . Kidney disease Sister   . Heart disease Brother   . Hypertension Brother   . COPD Brother     Social History:  reports that he has never smoked. He has never used smokeless tobacco. He reports that he does not drink alcohol or use drugs.  Allergies:  Allergies  Allergen Reactions  . Clonidine Derivatives Swelling and Other (See Comments)    Leg swelling  .  Hydralazine Hcl Swelling and Other (See Comments)    Leg swelling     Medications: I have reviewed the patient's current medications.  Results for orders placed or performed during the hospital encounter of 08/20/18 (from the past 48 hour(s))  Type and screen [place]     Status: None (Preliminary result)   Collection Time: 08/20/18  2:20 PM  Result Value Ref Range   ABO/RH(D) A POS    Antibody Screen NEG    Sample Expiration      08/23/2018 Performed at [facility], [place] [address] [place], [place] [address]    Unit Number [id number]    Blood Component Type RBC LR PHER1    Unit division 00    Status of Unit ALLOCATED    Transfusion Status OK TO TRANSFUSE    Crossmatch Result Compatible    Unit Number [id number]    Blood Component Type RED CELLS,LR    Unit division 00    Status of Unit ALLOCATED    Transfusion Status OK TO TRANSFUSE    Crossmatch Result Compatible   Basic metabolic panel     Status: Abnormal   Collection Time: 08/20/18  2:57 PM  Result Value Ref Range   Sodium 139  135 - 145 mmol/L   Potassium >7.5 (HH) 3.5 - 5.1 mmol/L    Comment: NO VISIBLE HEMOLYSIS CRITICAL RESULT CALLED TO, READ BACK BY AND VERIFIED WITH: K BROWN,RN 1604 08/20/2017  WBOND    Chloride 93 (L) 98 - 111 mmol/L   CO2 14 (L) 22 - 32 mmol/L   Glucose, Bld 113 (H) 70 - 99 mg/dL   BUN 187 (H) 6 - 20 mg/dL   Creatinine, Ser 43.68 (H) 0.61 - 1.24 mg/dL    Comment: RESULTS CONFIRMED BY MANUAL DILUTION   Calcium 9.0 8.9 - 10.3 mg/dL   GFR calc non Af Amer 1 (L) >60 mL/min   GFR calc Af Amer 1 (L) >60 mL/min   Anion gap 32 (H) 5 - 15    Comment: Performed at [place] [address], [place] [address]  CBC     Status: Abnormal   Collection Time: 08/20/18  2:57 PM  Result Value Ref Range   WBC 8.1 4.0 - 10.5 K/uL   RBC 2.12 (L) 4.22 - 5.81 MIL/uL   Hemoglobin 6.0 (LL) 13.0 - 17.0 g/dL    Comment: REPEATED TO VERIFY THIS CRITICAL RESULT  HAS VERIFIED AND BEEN CALLED TO FUNK,B RN BY AMANDA LEONARD ON 01 14 2020 AT 1779, AND HAS BEEN READ BACK.     HCT 18.5 (L) 39.0 - 52.0 %   MCV 87.3 80.0 - 100.0 fL   MCH 28.3 26.0 - 34.0 pg   MCHC 32.4 30.0 - 36.0 g/dL   RDW 13.2 11.5 - 15.5 %   Platelets 157 150 - 400 K/uL   nRBC 0.0 0.0 - 0.2 %    Comment: Performed at [facility], [place] [address], [place], [place] [address]  I-stat troponin, ED     Status: Abnormal   Collection Time: 08/20/18  3:16 PM  Result Value Ref Range   Troponin i, poc 3.69 (HH) 0.00 - 0.08 ng/mL   Comment NOTIFIED PHYSICIAN    Comment 3            Comment: Due to the release kinetics of cTnI, a negative result within the first hours of the onset of symptoms does not rule out myocardial infarction with certainty. If myocardial infarction is still suspected, repeat the test at appropriate intervals.   Prepare RBC     Status: None   Collection Time: 08/20/18  4:16 PM  Result Value Ref Range   Order Confirmation      ORDER PROCESSED BY BLOOD BANK Performed at [facility], [place] [address] [place],  [address]     Dg Chest 2 View  Result Date: 08/20/2018 CLINICAL DATA:  Shortness of breath, missed dialysis therapy EXAM: CHEST - 2 VIEW COMPARISON:  04/03/2018 FINDINGS: Cardiac shadow remains enlarged. Dialysis catheter is again noted and stable. The lungs are well aerated bilaterally. No significant vascular congestion is seen. Patchy infiltrate in the right lung base posteriorly is noted. No acute bony abnormality is noted. IMPRESSION: Patchy right basilar infiltrate. No evidence of volume overload. Electronically Signed   By: Inez Catalina M.D.   On: 08/20/2018 15:15    ROS Blood pressure (!) 179/125, pulse 96, temperature 97.8 F (36.6 C), resp. rate (!) 21, SpO2 99 %. Physical Exam Physical Examination: General appearance - flap, falls asleep, confused, pale Mental status - inappropriate safety awareness, as above Eyes - severe  HTN changes Mouth - pale,  Neck - adenopathy noted PCL Lymphatics -  posterior cervical nodes Chest - rales in bases Heart - S1 and S2 normal, S4 present, systolic murmur NF6/2 at 2nd left intercostal space and LV lift Abdomen - liver down 7 cm tender Neurological - falls asleep and no full answers, moves all extrem to stim, pos asterixis,  Extremities - pedal edema 2-3 + Skin - pale and sallow,  PC RIJ  Assessment/Plan: 1 Uremic with ^ K, acidemia, vol xs, enceph, hyperosm.  Needs HD but will have to do slow with his severe uremia.  He has not dialyzed in >2-3 wk. ,  Behavior issue and may leave before resolve 2 ESRD: NONADHERENCE primary issue 3 Hypertension: lower vol , give med 4. Anemia of ESRD: will do Fe, also Hb will rise with lowering vol 5. Metabolic Bone Disease: check Phos 6 NONADHERENCE 7 CVA 8 AAA 9 Severely low EF 10 enceph from uremia P HD, low flow, short time, lower vol , check labs, give meds  Mauricia Area 08/20/2018, 4:56 PM

## 2018-08-20 NOTE — Procedures (Signed)
Patient seen and examined on Hemodialysis.  Sleeping on HD.  Opens eyes to voice.    BP (!) 156/117   Pulse 92   Temp (!) 97.5 F (36.4 C) (Axillary)   Resp 17   SpO2 100%   + S3, bibasilar faint crackles, 1+ LE edema.  100% on 4L [place], RR 10-13. QB BFR 200 mL/ min DFR 500 mL/ min UF goal 3L.  Tolerating treatment without complaints at this time.  Has not had dialysis in a significant period of time- at least 2-3 weeks.  Low BFR rate and short rx time, get solute down gradually.    Madelon Lips MD [facility] pgr [phone] 9:00 PM

## 2018-08-21 ENCOUNTER — Encounter (HOSPITAL_COMMUNITY): Payer: Self-pay | Admitting: *Deleted

## 2018-08-21 ENCOUNTER — Other Ambulatory Visit: Payer: Self-pay

## 2018-08-21 DIAGNOSIS — Z992 Dependence on renal dialysis: Secondary | ICD-10-CM

## 2018-08-21 DIAGNOSIS — Z9115 Patient's noncompliance with renal dialysis: Secondary | ICD-10-CM

## 2018-08-21 DIAGNOSIS — E875 Hyperkalemia: Secondary | ICD-10-CM

## 2018-08-21 DIAGNOSIS — N186 End stage renal disease: Secondary | ICD-10-CM

## 2018-08-21 DIAGNOSIS — I5033 Acute on chronic diastolic (congestive) heart failure: Secondary | ICD-10-CM

## 2018-08-21 DIAGNOSIS — D631 Anemia in chronic kidney disease: Secondary | ICD-10-CM

## 2018-08-21 LAB — RENAL FUNCTION PANEL
Albumin: 3.7 g/dL (ref 3.5–5.0)
Anion gap: 25 — ABNORMAL HIGH (ref 5–15)
BUN: 114 mg/dL — ABNORMAL HIGH (ref 6–20)
CO2: 21 mmol/L — ABNORMAL LOW (ref 22–32)
Calcium: 9.2 mg/dL (ref 8.9–10.3)
Chloride: 95 mmol/L — ABNORMAL LOW (ref 98–111)
Creatinine, Ser: 27.49 mg/dL — ABNORMAL HIGH (ref 0.61–1.24)
GFR calc Af Amer: 2 mL/min — ABNORMAL LOW (ref >60)
GFR, EST NON AFRICAN AMERICAN: 2 mL/min — AB (ref >60)
Glucose, Bld: 109 mg/dL — ABNORMAL HIGH (ref 70–99)
Phosphorus: 8 mg/dL — ABNORMAL HIGH (ref 2.5–4.6)
Potassium: 4.6 mmol/L (ref 3.5–5.1)
Sodium: 141 mmol/L (ref 135–145)

## 2018-08-21 LAB — BASIC METABOLIC PANEL
Anion gap: 20 — ABNORMAL HIGH (ref 5–15)
BUN: 118 mg/dL — ABNORMAL HIGH (ref 6–20)
CO2: 23 mmol/L (ref 22–32)
Calcium: 8.7 mg/dL — ABNORMAL LOW (ref 8.9–10.3)
Chloride: 97 mmol/L — ABNORMAL LOW (ref 98–111)
Creatinine, Ser: 29.78 mg/dL — ABNORMAL HIGH (ref 0.61–1.24)
GFR calc Af Amer: 2 mL/min — ABNORMAL LOW (ref >60)
GFR calc non Af Amer: 1 mL/min — ABNORMAL LOW (ref >60)
Glucose, Bld: 127 mg/dL — ABNORMAL HIGH (ref 70–99)
POTASSIUM: 4.9 mmol/L (ref 3.5–5.1)
Sodium: 140 mmol/L (ref 135–145)

## 2018-08-21 LAB — PROTIME-INR
INR: 1.24
Prothrombin Time: 15.5 seconds — ABNORMAL HIGH (ref 11.4–15.2)

## 2018-08-21 LAB — GLUCOSE, CAPILLARY
Glucose-Capillary: 84 mg/dL (ref 70–99)
Glucose-Capillary: 125 mg/dL — ABNORMAL HIGH (ref 70–99)
Glucose-Capillary: 161 mg/dL — ABNORMAL HIGH (ref 70–99)

## 2018-08-21 LAB — POTASSIUM: POTASSIUM: 5.1 mmol/L (ref 3.5–5.1)

## 2018-08-21 LAB — HIV ANTIBODY (ROUTINE TESTING W REFLEX): HIV Screen 4th Generation wRfx: NONREACTIVE

## 2018-08-21 LAB — POCT I-STAT, CHEM 8
BUN: 102 mg/dL — ABNORMAL HIGH (ref 6–20)
Calcium, Ion: 1.06 mmol/L — ABNORMAL LOW (ref 1.15–1.40)
Chloride: 98 mmol/L (ref 98–111)
Creatinine, Ser: >18 mg/dL — ABNORMAL HIGH (ref 0.61–1.24)
GLUCOSE: 85 mg/dL (ref 70–99)
HCT: 22 % — ABNORMAL LOW (ref 39.0–52.0)
HEMOGLOBIN: 7.5 g/dL — AB (ref 13.0–17.0)
Potassium: 4 mmol/L (ref 3.5–5.1)
Sodium: 138 mmol/L (ref 135–145)
TCO2: 25 mmol/L (ref 22–32)

## 2018-08-21 LAB — CBC
HCT: 18.7 % — ABNORMAL LOW (ref 39.0–52.0)
Hemoglobin: 6.5 g/dL — CL (ref 13.0–17.0)
MCH: 29 pg (ref 26.0–34.0)
MCHC: 34.8 g/dL (ref 30.0–36.0)
MCV: 83.5 fL (ref 80.0–100.0)
Platelets: 127 10*3/uL — ABNORMAL LOW (ref 150–400)
RBC: 2.24 MIL/uL — ABNORMAL LOW (ref 4.22–5.81)
RDW: 13.2 % (ref 11.5–15.5)
WBC: 7.9 10*3/uL (ref 4.0–10.5)
nRBC: 0 % (ref 0.0–0.2)

## 2018-08-21 LAB — HEMOGLOBIN AND HEMATOCRIT, BLOOD
HCT: 19.8 % — ABNORMAL LOW (ref 39.0–52.0)
Hemoglobin: 6.6 g/dL — CL (ref 13.0–17.0)

## 2018-08-21 LAB — TROPONIN I: TROPONIN I: 3.11 ng/mL — AB (ref <0.03)

## 2018-08-21 LAB — HEPATITIS B SURFACE ANTIGEN: Hepatitis B Surface Ag: NEGATIVE

## 2018-08-21 LAB — MRSA PCR SCREENING: MRSA by PCR: NEGATIVE

## 2018-08-21 MED ORDER — HEPARIN SODIUM (PORCINE) 1000 UNIT/ML IJ SOLN
INTRAMUSCULAR | Status: AC
  Administered 2018-08-21: 16:00:00
  Filled 2018-08-21: qty 4

## 2018-08-21 MED ORDER — HEPARIN SODIUM (PORCINE) 1000 UNIT/ML DIALYSIS: 1000.0000 [IU] | INTRAMUSCULAR | Status: DC | PRN

## 2018-08-21 MED ORDER — LIDOCAINE HCL (PF) 1 % IJ SOLN: 5.0000 mL | INTRAMUSCULAR | Status: DC | PRN

## 2018-08-21 MED ORDER — HEPARIN SODIUM (PORCINE) 1000 UNIT/ML DIALYSIS: 40.0000 [IU]/kg | Freq: Once | INTRAMUSCULAR | Status: DC

## 2018-08-21 MED ORDER — CALCITRIOL 0.5 MCG PO CAPS
ORAL_CAPSULE | ORAL | Status: AC
  Administered 2018-08-21: 16:00:00
  Filled 2018-08-21: qty 1

## 2018-08-21 MED ORDER — CHLORHEXIDINE GLUCONATE CLOTH 2 % EX PADS: 6.0000 | MEDICATED_PAD | Freq: Every day | CUTANEOUS | Status: DC

## 2018-08-21 MED ORDER — ORAL CARE MOUTH RINSE: 15.0000 mL | Freq: Two times a day (BID) | OROMUCOSAL | Status: DC

## 2018-08-21 MED ORDER — PENTAFLUOROPROP-TETRAFLUOROETH EX AERO: 1.0000 "application " | INHALATION_SPRAY | CUTANEOUS | Status: DC | PRN

## 2018-08-21 MED ORDER — HYDRALAZINE HCL 20 MG/ML IJ SOLN
10.0000 mg | Freq: Once | INTRAMUSCULAR | Status: AC
  Administered 2018-08-21: 10 mg via INTRAVENOUS
  Filled 2018-08-21: qty 1

## 2018-08-21 MED ORDER — ALTEPLASE 2 MG IJ SOLR: 2.0000 mg | Freq: Once | INTRAMUSCULAR | Status: DC | PRN

## 2018-08-21 MED ORDER — SODIUM CHLORIDE 0.9 % IV SOLN: 100.0000 mL | INTRAVENOUS | Status: DC | PRN

## 2018-08-21 MED ORDER — LIDOCAINE-PRILOCAINE 2.5-2.5 % EX CREA: 1.0000 "application " | TOPICAL_CREAM | CUTANEOUS | Status: DC | PRN

## 2018-08-21 NOTE — Progress Notes (Signed)
Subjective: Interval History: has no complaint . Agrees to HD.  Objective: Vital signs in last 24 hours: Temp:  [97.5 F (36.4 C)-98.1 F (36.7 C)] 97.8 F (36.6 C) (01/15 0825) Pulse Rate:  [79-199] 93 (01/15 0825) Resp:  [0-28] 20 (01/15 0825) BP: (137-190)/(102-126) 164/108 (01/15 0825) SpO2:  [76 %-100 %] 100 % (01/15 0825) Weight:  [65.9 kg] 65.9 kg (01/15 0307) Weight change:   Intake/Output from previous day: 01/14 0701 - 01/15 0700 In: 390 [P.O.:120; Blood:270] Out: 3000  Intake/Output this shift: No intake/output data recorded.  General appearance: pale, slowed mentation and falls asleep talking Resp: rales bibasilar Chest wall: RIJ cath Cardio: S1, S2 normal and systolic murmur: holosystolic 2/6, blowing at apex GI: soft, pos bs, liver down 5 cm Extremities: edema 3+  Lab Results: Recent Labs    08/20/18 2259 08/21/18 0429 08/21/18 0639  WBC 8.1 7.9  --   HGB 6.2* 6.5* 6.6*  HCT 18.0* 18.7* 19.8*  PLT 146* 127*  --    BMET:  Recent Labs    08/20/18 2259 08/21/18 0429 08/21/18 0639  NA 141 140  --   K 4.6 4.9 5.1  CL 95* 97*  --   CO2 21* 23  --   GLUCOSE 109* 127*  --   BUN 114* 118*  --   CREATININE 27.49* 29.78*  --   CALCIUM 9.2 8.7*  --    No results for input(s): PTH in the last 72 hours. Iron Studies:  Recent Labs    08/20/18 1652  IRON 115  TIBC 183*  FERRITIN 2,105*    Studies/Results: Dg Chest 2 View  Result Date: 08/20/2018 CLINICAL DATA:  Shortness of breath, missed dialysis therapy EXAM: CHEST - 2 VIEW COMPARISON:  04/03/2018 FINDINGS: Cardiac shadow remains enlarged. Dialysis catheter is again noted and stable. The lungs are well aerated bilaterally. No significant vascular congestion is seen. Patchy infiltrate in the right lung base posteriorly is noted. No acute bony abnormality is noted. IMPRESSION: Patchy right basilar infiltrate. No evidence of volume overload. Electronically Signed   By: Inez Catalina M.D.   On:  08/20/2018 15:15    I have reviewed the patient's current medications.  Assessment/Plan: 1 ESRD still uremic , needs slow, short HD. Lower vol. 2 HTN lower vol 3 AAA 4 anemia check Fe,  5 HPTH check PTH  6 CVA 7 low EF P HD, esa, serial HD.  Slow at this time to avoid dysequilib   LOS: 1 day   Jeneen Rinks Jamicheal Heard 08/21/2018,9:48 AM

## 2018-08-21 NOTE — Progress Notes (Addendum)
[facility] PROGRESS NOTE    Progress Note  Angel [name]  IWL:[id number] DOB: 04-Jul-1971 DOA: 08/20/2018 PCP: Gwenlyn Saran, MD     Brief Narrative:   Angel [name] is an 48 y.o. male past medical history of end-stage renal disease, noncompliance, with multiple AMA signout's, hypertension AAA anemia of chronic disease, combined chronic systolic and diastolic heart failure, non-STEMI was refused cath in the past on 08/17/2018 to missing almost 4 weeks of dialysis during this time he was admitted urgently dialyzed, after dialysis he left [place] comes in back again for dyspnea.  Found to be orthopnea no chest pain short of breath creatinine and potassium elevated, with a mild elevation in his troponins.  There was also evidence of uremic encephalopathy, for which he was urgently dialyzed  Assessment/Plan:   Acute respiratory failure with hypoxia due to acute combined systolic and diastolic heart failure with an EF of 25% in the setting of noncompliance with his dialysis: Admit to telemetry nephrology was consulted and recommended urgent dialysis. Was restarted on his Coreg and hydralazine. He is status post HD with 3 L removal on dialysis on 08/20/2018. For HD again.  Uremic encephalopathy/Hyperkalemia: Status post HD Appreciate nephrology's assistance. Improvement after HD of his encephalopathy and hyperkalemia.  Mixed metabolic acidosis normal anion gap and high anion gap Improving with HD.  Further management per renal.  Elevated troponins: In the setting of chronic renal disease. EKG unrevealing he denies any chest pain. Continue beta-blockers.  End-stage renal disease: Further dialysis per nephrology.  History of CAD: As a cath in the past. Continue beta-blockers and statins. Follow-up with cardiology as an outpatient.  Dyslipidemia: Continue statins.  Anemia of chronic disease: Hemoglobin has remained low, will probably transfuse 1  unit of packed red blood cells with dialysis.   DVT prophylaxis: heparin Family Communication:daughter Disposition Plan/Barrier to D/C: unable to determine Code Status:     Code Status Orders  (From admission, onward)         Start     Ordered   08/20/18 2354  Do not attempt resuscitation (DNR)  Continuous    Question Answer Comment  In the event of cardiac or respiratory ARREST Do not call a "code blue"   In the event of cardiac or respiratory ARREST Do not perform Intubation, CPR, defibrillation or ACLS   In the event of cardiac or respiratory ARREST Use medication by any route, position, wound care, and other measures to relive pain and suffering. May use oxygen, suction and manual treatment of airway obstruction as needed for comfort.      08/20/18 2353        Code Status History    Date Active Date Inactive Code Status Order ID Comments User Context   04/03/2018 1351 04/05/2018 1757 Full Code [id number]  Janora Norlander, MD ED   07/30/2017 0452 07/30/2017 1622 Full Code [id number]  Norval Morton, MD ED   07/23/2017 0427 07/24/2017 1807 Full Code [id number]  Vianne Bulls, MD ED   07/13/2017 1327 07/16/2017 2257 Full Code [id number]  Jani Gravel, MD Inpatient   06/24/2017 2353 06/27/2017 1737 Full Code [id number]  Jani Gravel, MD ED   06/20/2017 0545 06/21/2017 0230 Full Code [id number]  Ivor Costa, MD ED        IV Access:    Peripheral IV   Procedures and diagnostic studies:   Dg Chest 2 View  Result Date: 08/20/2018 CLINICAL DATA:  Shortness of breath, missed dialysis therapy  EXAM: CHEST - 2 VIEW COMPARISON:  04/03/2018 FINDINGS: Cardiac shadow remains enlarged. Dialysis catheter is again noted and stable. The lungs are well aerated bilaterally. No significant vascular congestion is seen. Patchy infiltrate in the right lung base posteriorly is noted. No acute bony abnormality is noted. IMPRESSION: Patchy right basilar infiltrate. No evidence of volume overload.  Electronically Signed   By: Inez Catalina M.D.   On: 08/20/2018 15:15     Medical Consultants:    None.  Anti-Infectives:   none  Subjective:    Angel [name] patient is very defensive complaining that his potassium is high as it is the healthcare systems faults.  Objective:    Vitals:   08/21/18 0118 08/21/18 0307 08/21/18 0810 08/21/18 0825  BP: (!) 181/114 (!) 168/123 (!) 171/111 (!) 164/108  Pulse: 89 95 98 93  Resp: 20 20 20 20   Temp: 97.7 F (36.5 C) 97.6 F (36.4 C) 98 F (36.7 C) 97.8 F (36.6 C)  TempSrc: Oral Oral Oral Oral  SpO2: 100% 98% 100% 100%  Weight:  65.9 kg      Intake/Output Summary (Last 24 hours) at 08/21/2018 0901 Last data filed at 08/21/2018 0600 Gross per 24 hour  Intake 390 ml  Output 3000 ml  Net -2610 ml   Filed Weights   08/21/18 0307  Weight: 65.9 kg    Exam: General exam: In no acute distress. Respiratory system: Good air movement and clear to auscultation. Cardiovascular system: S1 & S2 heard, RRR. +JVD, no rub Gastrointestinal system: Abdomen is nondistended, soft and nontender.  Central nervous system: Awake alert and oriented x2 slow to response and in his thinking he has asterixis. Extremities: No pedal edema. Skin: No rashes, lesions or ulcers   Data Reviewed:    Labs: Basic Metabolic Panel: Recent Labs  Lab 08/17/18 0613 08/20/18 1457 08/20/18 1703 08/20/18 2259 08/21/18 0429 08/21/18 0639  NA 141 139 137 141 140  --   K >7.5* >7.5* 7.4* 4.6 4.9 5.1  CL 94* 93* 93* 95* 97*  --   CO2 15* 14* 15* 21* 23  --   GLUCOSE 99 113* 231* 109* 127*  --   BUN 171* 187* 191* 114* 118*  --   CREATININE 38.66* 43.68* 43.96* 27.49* 29.78*  --   CALCIUM 9.7 9.0 8.3* 9.2 8.7*  --   PHOS  --   --   --  8.0*  --   --    GFR Estimated Creatinine Clearance: 2.7 mL/min (A) (by C-G formula based on SCr of 29.78 mg/dL (H)). Liver Function Tests: Recent Labs  Lab 08/17/18 0613 08/20/18 2259  AST 26  --   ALT 24   --   ALKPHOS 101  --   BILITOT 0.8  --   PROT 7.7  --   ALBUMIN 3.9 3.7   No results for input(s): LIPASE, AMYLASE in the last 168 hours. No results for input(s): AMMONIA in the last 168 hours. Coagulation profile Recent Labs  Lab 08/21/18 0429  INR 1.24    CBC: Recent Labs  Lab 08/17/18 0613 08/20/18 1457 08/20/18 2259 08/21/18 0429 08/21/18 0639  WBC 8.3 8.1 8.1 7.9  --   HGB 5.0* 6.0* 6.2* 6.5* 6.6*  HCT 15.2* 18.5* 18.0* 18.7* 19.8*  MCV 88.4 87.3 82.6 83.5  --   PLT 174 157 146* 127*  --    Cardiac Enzymes: Recent Labs  Lab 08/21/18 0429  TROPONINI 3.11*   BNP (last 3 results) No  results for input(s): PROBNP in the last 8760 hours. CBG: Recent Labs  Lab 08/20/18 1710 08/21/18 0740  GLUCAP 245* 125*   D-Dimer: No results for input(s): DDIMER in the last 72 hours. Hgb A1c: No results for input(s): HGBA1C in the last 72 hours. Lipid Profile: No results for input(s): CHOL, HDL, LDLCALC, TRIG, CHOLHDL, LDLDIRECT in the last 72 hours. Thyroid function studies: No results for input(s): TSH, T4TOTAL, T3FREE, THYROIDAB in the last 72 hours.  Invalid input(s): FREET3 Anemia work up: Recent Labs    08/20/18 1652  VITAMINB12 1,638*  FOLATE 14.9  FERRITIN 2,105*  TIBC 183*  IRON 115  RETICCTPCT 1.9   Sepsis Labs: Recent Labs  Lab 08/17/18 0613 08/20/18 1457 08/20/18 2259 08/21/18 0429  WBC 8.3 8.1 8.1 7.9   Microbiology Recent Results (from the past 240 hour(s))  MRSA PCR Screening     Status: None   Collection Time: 08/21/18  3:12 AM  Result Value Ref Range Status   MRSA by PCR NEGATIVE NEGATIVE Final    Comment:        The GeneXpert MRSA Assay (FDA approved for NASAL specimens only), is one component of a comprehensive MRSA colonization surveillance program. It is not intended to diagnose MRSA infection nor to guide or monitor treatment for MRSA infections. Performed at [facility], [place] [address] [place], [place] [address]       Medications:   . sodium chloride   Intravenous Once  . albuterol  10 mg Nebulization Once  . aspirin EC  81 mg Oral Daily  . atorvastatin  40 mg Oral QHS  . calcitRIOL  0.5 mcg Oral Daily  . calcium gluconate  1 g Intravenous Once  . carvedilol  25 mg Oral BID WC  . Chlorhexidine Gluconate Cloth  6 each Topical Q0600  . cholecalciferol  1,000 Units Oral BID  . cloNIDine  0.2 mg Oral BID  . insulin aspart  5 Units Intravenous Once   And  . dextrose  1 ampule Intravenous Once  . heparin      . heparin  5,000 Units Subcutaneous Q8H  . hydrALAZINE  100 mg Oral TID  . mouth rinse  15 mL Mouth Rinse BID  . pantoprazole  40 mg Oral Daily  . sevelamer carbonate  1,600 mg Oral TID WC  . sodium bicarbonate  50 mEq Intravenous Once  . sodium polystyrene  30 g Oral Once  . sodium zirconium cyclosilicate  10 g Oral BID   Continuous Infusions: . sodium chloride    . sodium chloride       LOS: 1 day   Charlynne Cousins  [facility]   *Please refer to [email or web address], password [email or web address] to get updated schedule on who will round on this patient, as hospitalists switch teams weekly. If 7PM-7AM, please contact night-coverage at [email or web address], password [email or web address] for any overnight needs.  08/21/2018, 9:01 AM

## 2018-08-21 NOTE — Procedures (Signed)
I was present at this session.  I have reviewed the session itself and made appropriate changes. Using IJ PC., low flows , short time, lower some vol . bp ^.  tol thus far.  Jeneen Rinks Mehul Rudin 1/15/20202:25 PM

## 2018-08-22 LAB — BPAM RBC
BLOOD PRODUCT EXPIRATION DATE: 202002032359
Blood Product Expiration Date: 202002032359
ISSUE DATE / TIME: 202001150053
ISSUE DATE / TIME: 202001150804
Unit Type and Rh: 6200
Unit Type and Rh: 6200

## 2018-08-22 LAB — TYPE AND SCREEN
ABO/RH(D): A POS
Antibody Screen: NEGATIVE
Unit division: 0
Unit division: 0

## 2018-08-22 NOTE — Discharge Summary (Signed)
Physician Discharge Summary  Angel [name] Ricotta QIO:[id number] DOB: Nov 24, 1970 DOA: 08/20/2018  PCP: Gwenlyn Saran, MD  Admit date: 08/20/2018 Discharge date: 08/22/2018  Admitted From: home Disposition:  Home  Recommendations for Outpatient Follow-up:  1. None  Again he left AGAINST MEDICAL ADVICE  Home Health:No Equipment/Devices:none  Discharge Condition:stable CODE STATUS:full Diet recommendation: Heart Healthy   Brief/Interim Summary: 48 y.o. male past medical history of end-stage renal disease, noncompliance, with multiple AMA signout's, hypertension AAA anemia of chronic disease, combined chronic systolic and diastolic heart failure, non-STEMI was refused cath in the past on 08/17/2018 to missing almost 4 weeks of dialysis during this time he was admitted urgently dialyzed, after dialysis he left [place] comes in back again for dyspnea.  Found to be orthopnea no chest pain short of breath creatinine and potassium elevated, with a mild elevation in his troponins.  There was also evidence of uremic encephalopathy, for which he was urgently dialyzed  Discharge Diagnoses:  Principal Problem:   Acute respiratory insufficiency Active Problems:   Stroke (cerebrum) ([name])   Acute on chronic diastolic CHF (congestive heart failure) ([name])   GERD (gastroesophageal reflux disease)   Anemia in ESRD (end-stage renal disease) ([facility])   Dialysis patient, noncompliant ([name])   Chronic combined systolic and diastolic CHF (congestive heart failure) ([name])   FSGS (focal segmental glomerulosclerosis)   Acute hypercapnic respiratory failure ([name])  Acute respiratory failure with hypoxia due to acute combined systolic and diastolic heart failure with an EF of 25% in the setting of noncompliance with his dialysis: He was admitted to telemetry nephrology was consulted and he was dialyzed 2 days in row on his last dialysis in the afternoon the patient decided to leave [place]. Patient was very untrustworthy of the health system, and more than once he expressed that he did not trust the health system in general I explained to him that he does need to go to dialysis at least 3 times a week as this will impact significantly his health if he does not follow through his dialysis.  Uremic encephalopathy/hyperkalemia: Status post HD, improved with hemodialysis.  Mixed metabolic acidosis/normal gap anion gap and high anion gap acidosis: Likely due to end-stage renal disease improved with dialysis.  Elevated troponins: The setting of chronic renal disease: He denied any chest pain EKG was unrevealing he was continue on his beta-blocker.  End-stage renal disease: He was dialyzed twice during his hospital stay on the last dialysis he  left [place].  Dyslipidemia: Continue statins.  Anemia chronic disease: Hemoglobin remained low he was transfused 1 unit of packed red blood cells , refused a CBC as he was leaving [place].  Discharge Instructions   Allergies as of 08/21/2018      Reactions   Clonidine Derivatives Swelling, Other (See Comments)   Leg swelling   Hydralazine Hcl Swelling, Other (See Comments)   Leg swelling- patient wants this stopped as early as possible if it's been ordered- can only tolerate for a short window of time   Irbesartan Other (See Comments)   Cannot tolerate this in higher doses- causes fluid retention   Losartan Potassium-hctz Other (See Comments)   "Raised my potassium"      Medication List    ASK your doctor about these medications   amLODipine 10 MG tablet Commonly known as:  NORVASC Take 10 mg by mouth daily.   aspirin EC 81 MG tablet Take 81 mg daily by mouth.  atorvastatin 40 MG tablet Commonly known as:  LIPITOR Take 40 mg at bedtime by mouth.   calcitRIOL 0.5 MCG capsule Commonly known as:  ROCALTROL Take 0.5 mcg by mouth daily.   carvedilol 25 MG tablet Commonly known as:   COREG Take 25 mg 2 (two) times daily with a meal by mouth.   cholecalciferol 1000 units tablet Commonly known as:  VITAMIN D Take 1,000 Units by mouth 2 (two) times daily.   cloNIDine 0.2 MG tablet Commonly known as:  CATAPRES Take 0.2 mg by mouth 2 (two) times daily.   CULTURELLE Caps Take 1 capsule by mouth daily.   furosemide 80 MG tablet Commonly known as:  LASIX Take 80 mg at bedtime by mouth.   hydrALAZINE 100 MG tablet Commonly known as:  APRESOLINE Take 100 mg by mouth 3 (three) times daily.   hydrOXYzine 10 MG tablet Commonly known as:  ATARAX/VISTARIL Take 10 mg by mouth daily as needed for itching or anxiety.   irbesartan 150 MG tablet Commonly known as:  AVAPRO Take 150 mg by mouth daily.   multivitamin Tabs tablet Take 1 tablet by mouth 2 (two) times daily.   OMEGA III EPA+DHA 1000 MG Caps Take 1,000 mg by mouth daily.   pantoprazole 40 MG tablet Commonly known as:  PROTONIX Take 40 mg by mouth daily.   sevelamer carbonate 800 MG tablet Commonly known as:  RENVELA Take 1,600 mg by mouth See admin instructions. Take 1,600 mg by mouth three times a day with meals and 1,600 mg with each snack   WHITE PETROLATUM-MINERAL OIL OP Apply 1 application topically 2 (two) times daily as needed (itching).       Allergies  Allergen Reactions  . Clonidine Derivatives Swelling and Other (See Comments)    Leg swelling  . Hydralazine Hcl Swelling and Other (See Comments)    Leg swelling- patient wants this stopped as early as possible if it's been ordered- can only tolerate for a short window of time  . Irbesartan Other (See Comments)    Cannot tolerate this in higher doses- causes fluid retention  . Losartan Potassium-Hctz Other (See Comments)    "Raised my potassium"    Consultations:  Nephrology   Procedures/Studies: Dg Chest 2 View  Result Date: 08/20/2018 CLINICAL DATA:  Shortness of breath, missed dialysis therapy EXAM: CHEST - 2 VIEW  COMPARISON:  04/03/2018 FINDINGS: Cardiac shadow remains enlarged. Dialysis catheter is again noted and stable. The lungs are well aerated bilaterally. No significant vascular congestion is seen. Patchy infiltrate in the right lung base posteriorly is noted. No acute bony abnormality is noted. IMPRESSION: Patchy right basilar infiltrate. No evidence of volume overload. Electronically Signed   By: Inez Catalina M.D.   On: 08/20/2018 15:15     Subjective: See progress note for further details.  Discharge Exam: Vitals:   08/21/18 2042 08/21/18 2042  BP: (!) 161/108 (!) 161/108  Pulse: 92 92  Resp: 18 18  Temp: 98.7 F (37.1 C) 98.7 F (37.1 C)  SpO2:  96%   Vitals:   08/21/18 1659 08/21/18 1821 08/21/18 2042 08/21/18 2042  BP: (!) 183/114 (!) 157/98 (!) 161/108 (!) 161/108  Pulse: 97 97 92 92  Resp: 18  18 18   Temp: 98.5 F (36.9 C)  98.7 F (37.1 C) 98.7 F (37.1 C)  TempSrc: Oral     SpO2: 94%   96%  Weight:        General: See progress note for further  details.    The results of significant diagnostics from this hospitalization (including imaging, microbiology, ancillary and laboratory) are listed below for reference.     Microbiology: Recent Results (from the past 240 hour(s))  MRSA PCR Screening     Status: None   Collection Time: 08/21/18  3:12 AM  Result Value Ref Range Status   MRSA by PCR NEGATIVE NEGATIVE Final    Comment:        The GeneXpert MRSA Assay (FDA approved for NASAL specimens only), is one component of a comprehensive MRSA colonization surveillance program. It is not intended to diagnose MRSA infection nor to guide or monitor treatment for MRSA infections. Performed at [facility], [place] [address] [place], [place] [address]      Labs: BNP (last 3 results) No results for input(s): BNP in the last 8760 hours. Basic Metabolic Panel: Recent Labs  Lab 08/17/18 0613 08/20/18 1457 08/20/18 1703 08/20/18 2117 08/20/18 2259  08/21/18 0429 08/21/18 0639  NA 141 139 137 138 141 140  --   K >7.5* >7.5* 7.4* 4.0 4.6 4.9 5.1  CL 94* 93* 93* 98 95* 97*  --   CO2 15* 14* 15*  --  21* 23  --   GLUCOSE 99 113* 231* 85 109* 127*  --   BUN 171* 187* 191* 102* 114* 118*  --   CREATININE 38.66* 43.68* 43.96* >18.00* 27.49* 29.78*  --   CALCIUM 9.7 9.0 8.3*  --  9.2 8.7*  --   PHOS  --   --   --   --  8.0*  --   --    Liver Function Tests: Recent Labs  Lab 08/17/18 0613 08/20/18 2259  AST 26  --   ALT 24  --   ALKPHOS 101  --   BILITOT 0.8  --   PROT 7.7  --   ALBUMIN 3.9 3.7   No results for input(s): LIPASE, AMYLASE in the last 168 hours. No results for input(s): AMMONIA in the last 168 hours. CBC: Recent Labs  Lab 08/17/18 0613 08/20/18 1457 08/20/18 2117 08/20/18 2259 08/21/18 0429 08/21/18 0639  WBC 8.3 8.1  --  8.1 7.9  --   HGB 5.0* 6.0* 7.5* 6.2* 6.5* 6.6*  HCT 15.2* 18.5* 22.0* 18.0* 18.7* 19.8*  MCV 88.4 87.3  --  82.6 83.5  --   PLT 174 157  --  146* 127*  --    Cardiac Enzymes: Recent Labs  Lab 08/21/18 0429  TROPONINI 3.11*   BNP: Invalid input(s): POCBNP CBG: Recent Labs  Lab 08/20/18 1710 08/20/18 2119 08/21/18 0740 08/21/18 2044  GLUCAP 245* 84 125* 161*   D-Dimer No results for input(s): DDIMER in the last 72 hours. Hgb A1c No results for input(s): HGBA1C in the last 72 hours. Lipid Profile No results for input(s): CHOL, HDL, LDLCALC, TRIG, CHOLHDL, LDLDIRECT in the last 72 hours. Thyroid function studies No results for input(s): TSH, T4TOTAL, T3FREE, THYROIDAB in the last 72 hours.  Invalid input(s): FREET3 Anemia work up Recent Labs    08/20/18 1652  VITAMINB12 1,638*  FOLATE 14.9  FERRITIN 2,105*  TIBC 183*  IRON 115  RETICCTPCT 1.9   Urinalysis    Component Value Date/Time   COLORURINE YELLOW (A) 01/04/2018 1030   APPEARANCEUR CLEAR (A) 01/04/2018 1030   LABSPEC 1.004 (L) 01/04/2018 1030   PHURINE 9.0 (H) 01/04/2018 1030   GLUCOSEU NEGATIVE  01/04/2018 1030   [place] NEGATIVE 01/04/2018 1030  BILIRUBINUR NEGATIVE 01/04/2018 1030   KETONESUR NEGATIVE 01/04/2018 1030   PROTEINUR 100 (A) 01/04/2018 1030   NITRITE NEGATIVE 01/04/2018 1030   LEUKOCYTESUR NEGATIVE 01/04/2018 1030   Sepsis Labs Invalid input(s): PROCALCITONIN,  WBC,  LACTICIDVEN Microbiology Recent Results (from the past 240 hour(s))  MRSA PCR Screening     Status: None   Collection Time: 08/21/18  3:12 AM  Result Value Ref Range Status   MRSA by PCR NEGATIVE NEGATIVE Final    Comment:        The GeneXpert MRSA Assay (FDA approved for NASAL specimens only), is one component of a comprehensive MRSA colonization surveillance program. It is not intended to diagnose MRSA infection nor to guide or monitor treatment for MRSA infections. Performed at [facility], [place] [address], [place], [place] [address]      Time coordinating discharge: 20 minutes  SIGNED:   Charlynne Cousins, MD  [facility] 08/22/2018, 12:20 PM Pager   If 7PM-7AM, please contact night-coverage [email or web address] Password [email or web address]

## 2018-08-23 ENCOUNTER — Inpatient Hospital Stay: Payer: Medicare Other | Admitting: Oncology

## 2018-08-23 ENCOUNTER — Inpatient Hospital Stay: Payer: Medicare Other

## 2018-09-06 ENCOUNTER — Encounter: Payer: Self-pay | Admitting: Oncology

## 2018-09-06 ENCOUNTER — Inpatient Hospital Stay: Payer: Medicare Other

## 2018-09-06 ENCOUNTER — Inpatient Hospital Stay (HOSPITAL_BASED_OUTPATIENT_CLINIC_OR_DEPARTMENT_OTHER): Payer: Medicare Other | Admitting: Oncology

## 2018-09-06 ENCOUNTER — Other Ambulatory Visit: Payer: Self-pay

## 2018-09-06 DIAGNOSIS — N186 End stage renal disease: Secondary | ICD-10-CM

## 2018-09-06 DIAGNOSIS — I5042 Chronic combined systolic (congestive) and diastolic (congestive) heart failure: Secondary | ICD-10-CM | POA: Diagnosis not present

## 2018-09-06 DIAGNOSIS — I129 Hypertensive chronic kidney disease with stage 1 through stage 4 chronic kidney disease, or unspecified chronic kidney disease: Secondary | ICD-10-CM

## 2018-09-06 DIAGNOSIS — Z79899 Other long term (current) drug therapy: Secondary | ICD-10-CM

## 2018-09-06 DIAGNOSIS — Z8673 Personal history of transient ischemic attack (TIA), and cerebral infarction without residual deficits: Secondary | ICD-10-CM

## 2018-09-06 DIAGNOSIS — D631 Anemia in chronic kidney disease: Secondary | ICD-10-CM

## 2018-09-06 DIAGNOSIS — K219 Gastro-esophageal reflux disease without esophagitis: Secondary | ICD-10-CM

## 2018-09-06 DIAGNOSIS — Z7982 Long term (current) use of aspirin: Secondary | ICD-10-CM

## 2018-09-06 DIAGNOSIS — Z992 Dependence on renal dialysis: Secondary | ICD-10-CM

## 2018-09-06 DIAGNOSIS — E785 Hyperlipidemia, unspecified: Secondary | ICD-10-CM

## 2018-09-06 DIAGNOSIS — R21 Rash and other nonspecific skin eruption: Secondary | ICD-10-CM

## 2018-09-06 DIAGNOSIS — R5383 Other fatigue: Secondary | ICD-10-CM

## 2018-09-06 DIAGNOSIS — I132 Hypertensive heart and chronic kidney disease with heart failure and with stage 5 chronic kidney disease, or end stage renal disease: Secondary | ICD-10-CM | POA: Diagnosis not present

## 2018-09-06 DIAGNOSIS — I10 Essential (primary) hypertension: Secondary | ICD-10-CM

## 2018-09-06 NOTE — Progress Notes (Signed)
Patient here for follow up. Last dialysis was yesterday 1/30. He states that he is getting aranesp injections at [facility]. Pt states that iron level is high and he is her today "to see if she will give me something to bring it down."  Blood pressure elevated, denies headache, dizziness or lightheadedness. He states he has not taken blood pressure medication today yet.

## 2018-09-07 NOTE — Progress Notes (Addendum)
Hematology/Oncology Follow up note [facility] [phone] Fax:([phone]) [phone]   Patient Care Team: Gwenlyn Saran, MD as PCP - General (Family Medicine) Burnell Blanks, MD as PCP - Cardiology (Cardiology)  REFERRING PROVIDER: Dr.Singh Sheilah Mins REASON FOR VISIT Follow up for treatment of anemia  HISTORY OF PRESENTING ILLNESS:  Angel [name] is a  48 y.o.  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  Patient reports that he has been getting Epogen with dialysis for about a year and a half.  Recently he has developed skin rash on buttock, also field questionable throat itching after he received Epogen injection.  Epogen was hold.  With further questioning, patient reports using new body wash during the same time period.  Patient has anemia of chronic kidney disease and was referred to [facility] for further evaluation. Patient reports feeling fatigue.  Denies any hematochezia or hematemesis, black tarry stool.  His rash improved after topical steroid cream   INTERVAL HISTORY Angel [name] is a 48 y.o. male who as above history reviewed by me today presents for follow-up visit for management of anemia secondary to chronic kidney disease, iron overload.  Is on hemodialysis and getting his Aranesp through dialysis at [facility]. Patient was recently admitted from 08/20/2020 08/22/2018 due to missing almost 4 weeks of dialysis.  Patient was urgently dialyzed, patient left [place] on 08/21/2018 showed hemoglobin 6.5, hematocrit 18.7, platelet count 127. 08/17/2018, normal AST and ALT.  Normal albumin. 08/20/2018, ferritin 2105, iron saturation 63.  TIBC 183. 03/26/2018, ferritin 1690, saturation ratio 14.   Patient recalls history of 7-8 blood transfusions in the past.  He also recalls having received about 7 IV iron  treatment along with his dialysis in the past.  Review of Systems  Constitutional: Positive for fatigue. Negative for appetite change, chills, fever and unexpected weight change.  HENT:   Negative for hearing loss and voice change.   Eyes: Negative for eye problems and icterus.  Respiratory: Negative for chest tightness, cough and shortness of breath.   Cardiovascular: Negative for chest pain and leg swelling.  Gastrointestinal: Negative for abdominal distention and abdominal pain.  Endocrine: Negative for hot flashes.  Genitourinary: Negative for difficulty urinating, dysuria and frequency.   Musculoskeletal: Negative for arthralgias.  Skin: Negative for itching and rash.  Neurological: Negative for light-headedness and numbness.  Hematological: Negative for adenopathy. Does not bruise/bleed easily.  Psychiatric/Behavioral: Negative for confusion.   MEDICAL HISTORY:  Past Medical History:  Diagnosis Date  . AAA (abdominal aortic aneurysm) ([place])   . Chronic anemia   . Chronic combined systolic and diastolic CHF (congestive heart failure) ([place])   . End stage renal disease ([place])   . ESRD on dialysis ([place])   . FSGS (focal segmental glomerulosclerosis)   . GERD (gastroesophageal reflux disease)   . HLD (hyperlipidemia)   . Hypertension   . NSTEMI (non-ST elevated myocardial infarction) ([place]) 07/2017   pt refused cath  . Pericardial effusion    a. small by echo 06/2017.  Marland Kitchen Renal disorder   . Seizure ([place])   . Stroke (cerebrum) [facility])     SURGICAL HISTORY: Past Surgical History:  Procedure Laterality Date  . ABDOMINAL AORTIC ANEURYSM REPAIR    . KIDNEY SURGERY      SOCIAL HISTORY: Social History   Socioeconomic History  . Marital  status: Divorced    Spouse name: Not on file  . Number of children: 5  . Years of education: 16  . Highest education level: Bachelor's degree (e.g., BA, AB, BS)  Occupational History    Comment: unemployed  Social Needs  . Financial resource  strain: Somewhat hard  . Food insecurity:    Worry: Never true    Inability: Never true  . Transportation needs:    Medical: Yes    Non-medical: Yes  Tobacco Use  . Smoking status: Never Smoker  . Smokeless tobacco: Never Used  Substance and Sexual Activity  . Alcohol use: No    Frequency: Never  . Drug use: No  . Sexual activity: Not Currently  Lifestyle  . Physical activity:    Days per week: 0 days    Minutes per session: 0 min  . Stress: Not at all  Relationships  . Social connections:    Talks on phone: More than three times a week    Gets together: More than three times a week    Attends religious service: More than 4 times per year    Active member of club or organization: No    Attends meetings of clubs or organizations: Never    Relationship status: Divorced  . Intimate partner violence:    Fear of current or ex partner: No    Emotionally abused: No    Physically abused: No    Forced sexual activity: No  Other Topics Concern  . Not on file  Social History Narrative  . Not on file    FAMILY HISTORY: Family History  Problem Relation Age of Onset  . Hypertension Mother   . Kidney disease Mother   . Asthma Father   . Hypertension Father   . Kidney disease Father   . Hypertension Sister   . Heart disease Sister   . Kidney disease Sister   . Heart disease Brother   . Hypertension Brother   . COPD Brother     ALLERGIES:  is allergic to clonidine derivatives; hydralazine hcl; irbesartan; and losartan potassium-hctz.  MEDICATIONS:  Current Outpatient Medications  Medication Sig Dispense Refill  . amLODipine (NORVASC) 10 MG tablet Take 10 mg by mouth daily.    Marland Kitchen aspirin EC 81 MG tablet Take 81 mg daily by mouth.    Marland Kitchen atorvastatin (LIPITOR) 40 MG tablet Take 40 mg at bedtime by mouth.    . calcitRIOL (ROCALTROL) 0.5 MCG capsule Take 0.5 mcg by mouth daily.     . carvedilol (COREG) 25 MG tablet Take 25 mg 2 (two) times daily with a meal by mouth.    .  cholecalciferol (VITAMIN D) 1000 units tablet Take 1,000 Units by mouth 2 (two) times daily.    . cloNIDine (CATAPRES) 0.2 MG tablet Take 0.2 mg by mouth 2 (two) times daily.  23  . furosemide (LASIX) 80 MG tablet Take 80 mg at bedtime by mouth.    . hydrALAZINE (APRESOLINE) 100 MG tablet Take 100 mg by mouth 3 (three) times daily.    . hydrOXYzine (ATARAX/VISTARIL) 10 MG tablet Take 10 mg by mouth daily as needed for itching or anxiety.     . irbesartan (AVAPRO) 150 MG tablet Take 150 mg by mouth daily.    . Lactobacillus Rhamnosus, GG, (CULTURELLE) CAPS Take 1 capsule by mouth daily.    . multivitamin (RENA-VIT) TABS tablet Take 1 tablet by mouth 2 (two) times daily.    . Omega-3 Fatty Acids (OMEGA  III EPA+DHA) 1000 MG CAPS Take 1,000 mg by mouth daily.     . pantoprazole (PROTONIX) 40 MG tablet Take 40 mg by mouth daily.     . sevelamer carbonate (RENVELA) 800 MG tablet Take 1,600 mg by mouth See admin instructions. Take 1,600 mg by mouth three times a day with meals and 1,600 mg with each snack    . WHITE PETROLATUM-MINERAL OIL OP Apply 1 application topically 2 (two) times daily as needed (itching).      No current facility-administered medications for this visit.      PHYSICAL EXAMINATION: ECOG PERFORMANCE STATUS: 1 - Symptomatic but completely ambulatory Vitals:   09/06/18 0902  BP: (!) 171/112  Pulse: 99  Resp: 18  Temp: (!) 96.5 F (35.8 C)   Filed Weights   09/06/18 0902  Weight: 130 lb 6.4 oz (59.1 kg)    Physical Exam Constitutional:      General: He is not in acute distress.    Appearance: He is well-developed. He is ill-appearing.  HENT:     Head: Normocephalic and atraumatic.     Right Ear: External ear normal.     Left Ear: External ear normal.  Eyes:     General: No scleral icterus.    Pupils: Pupils are equal, round, and reactive to light.  Neck:     Musculoskeletal: Normal range of motion and neck supple.  Cardiovascular:     Rate and Rhythm: Normal  rate and regular rhythm.     Heart sounds: Normal heart sounds.  Pulmonary:     Effort: Pulmonary effort is normal. No respiratory distress.     Breath sounds: Normal breath sounds. No wheezing or rales.  Chest:     Chest wall: No tenderness.  Abdominal:     General: Bowel sounds are normal. There is no distension.     Palpations: Abdomen is soft. There is no mass.     Tenderness: There is no abdominal tenderness. There is no guarding.  Musculoskeletal: Normal range of motion.        General: No deformity.  Lymphadenopathy:     Cervical: No cervical adenopathy.  Skin:    General: Skin is warm and dry.     Coloration: Skin is pale.     Findings: No erythema or rash.  Neurological:     Mental Status: He is alert and oriented to person, place, and time.     Cranial Nerves: No cranial nerve deficit.     Coordination: Coordination normal.     Deep Tendon Reflexes: Reflexes normal.  Psychiatric:        Behavior: Behavior normal.        Thought Content: Thought content normal.      LABORATORY DATA:  I have reviewed the data as listed Lab Results  Component Value Date   WBC 7.9 08/21/2018   HGB 6.6 (LL) 08/21/2018   HCT 19.8 (L) 08/21/2018   MCV 83.5 08/21/2018   PLT 127 (L) 08/21/2018   Recent Labs    04/04/18 0147 04/05/18 0900 08/17/18 6546  08/20/18 1703 08/20/18 2117 08/20/18 2259 08/21/18 0429 08/21/18 0639  NA 139 137 141   < > 137 138 141 140  --   K 5.2* 4.1 >7.5*   < > 7.4* 4.0 4.6 4.9 5.1  CL 97* 95* 94*   < > 93* 98 95* 97*  --   CO2 24 29 15*   < > 15*  --  21* 23  --  GLUCOSE 96 111* 99   < > 231* 85 109* 127*  --   BUN 89* 31* 171*   < > 191* 102* 114* 118*  --   CREATININE 18.56* 9.94* 38.66*   < > 43.96* >18.00* 27.49* 29.78*  --   CALCIUM 9.1 9.6 9.7   < > 8.3*  --  9.2 8.7*  --   GFRNONAA 3* 5* 1*   < > 1*  --  2* 1*  --   GFRAA 3* 6* 1*   < > 1*  --  2* 2*  --   PROT 6.3*  --  7.7  --   --   --   --   --   --   ALBUMIN 3.2* 3.3* 3.9  --   --    --  3.7  --   --   AST 21  --  26  --   --   --   --   --   --   ALT 16  --  24  --   --   --   --   --   --   ALKPHOS 76  --  101  --   --   --   --   --   --   BILITOT 1.0  --  0.8  --   --   --   --   --   --    < > = values in this interval not displayed.   Labs from [place] reviewed by me. 11/23/17.  Hemoglobin 8.5, vitamin B12 1199, folate 12.8, ferritin 1878.  Iron 59, TIBC 240.  WBC 6, MCV 88.6, platelet counts 2 41,000.  TSH 2.97 01/03/18   Hemoglobin 8.9, iron saturation 73, iron 160, TIBC 219, wbc 5.5, platelet 232,000,  01/07/2018 Hemoglobin 9.1 ferritin 1924, Iron saturation 38, TIBC 221 01/14/18  Hemoglobin 8.8, Hct 26.4. 03/06/2018 hemoglobin 7.1, HCT 22.4. 03/20/2018 hemoglobin 7.1  ASSESSMENT & PLAN:  1. Iron overload due to repeated red blood cell transfusions   2. ESRD (end stage renal disease) on dialysis ([place])   3. Anemia due to end stage renal disease ([place])   4. Uncontrolled hypertension   # Anemia of CKD, patient gets Aranesp via Dialysis at [facility].  # Iron overload, likely due to multiple blood transfusion.  Will obtain hemachromatosis panel. Patient needs to get labs done at [facility] due to lack of IV access.  Obtain MRI liver to further evaluation organ damage.     The patient knows to call the clinic with any problems questions or concerns. Cc Dr.Singh Return of visit: pending MRI results.  Earlie Server, MD, PhD Hematology Oncology [facility] at [place]- [phone] 09/07/2018   Addendum, I discussed patient's MRI results with radiology.  Our facility cannot perform quantitative measuring of iron deposits.  However qualitatively, patient has iron deposits in liver. I called patient and discussed with him. Image results were communicated to patient.  Patient has normal liver function. ESRD on dialysis.  We talked about most likely the reason of iron overload is due to multiple blood transfusion.  Patient was not compliant with hemodialysis  and Aranesp.  He has multiple ED admissions and got multiple blood transfusions there.  Discussed with him that phlebotomy is not an option for him for reducing the iron load.  Chelators have significant side effects and will be used as last resort.  He is also noncompliant which cavity issue on  chelator treatments. I advised patient to make sure that he is compliant with all his dialysis appointments.  He gets Aranesp through hemodialysis.  Usually dialysis patient loose blood over the time which will help to reduce the iron load.  Aranesp will also help to mobilize iron to the production of new red blood cells. For the time being I suggest patient to continue follow-up with me every 3 months with labs done at [facility] to monitor liver function and iron panel.  He voices understanding and willing to proceed.  Lamees Gable. Tasia Catchings

## 2018-09-10 ENCOUNTER — Telehealth: Payer: Self-pay | Admitting: *Deleted

## 2018-09-10 NOTE — Telephone Encounter (Signed)
Dr Yu patient?

## 2018-09-10 NOTE — Telephone Encounter (Signed)
[name] called reporting that they do not have capability to draw the Hematomachrosis DNA Profile Dr Janese Banks ordered to have them draw.

## 2018-09-10 NOTE — Telephone Encounter (Signed)
See below

## 2018-09-11 ENCOUNTER — Other Ambulatory Visit: Payer: Self-pay

## 2018-09-11 ENCOUNTER — Telehealth: Payer: Self-pay

## 2018-09-11 NOTE — Telephone Encounter (Signed)
Per triage nurse, received call yesterday from [facility] stating that they "do not have capability to draw the Hematomachrosis DNA Profile." I contacted patient this morning and he states that he has had drawn labs peripherally before and is willing to come in and get it drawn. Patient scheduled to get lab tomorrow ( 2/6) [facility].

## 2018-09-12 ENCOUNTER — Inpatient Hospital Stay: Payer: Medicare Other | Attending: Oncology

## 2018-09-12 ENCOUNTER — Ambulatory Visit
Admission: RE | Admit: 2018-09-12 | Discharge: 2018-09-12 | Disposition: A | Discharge status: Home / Self Care | Payer: Medicare Other | Source: Ambulatory Visit | Attending: Oncology | Admitting: Oncology

## 2018-09-12 DIAGNOSIS — I5042 Chronic combined systolic (congestive) and diastolic (congestive) heart failure: Secondary | ICD-10-CM | POA: Insufficient documentation

## 2018-09-12 DIAGNOSIS — N186 End stage renal disease: Secondary | ICD-10-CM | POA: Diagnosis not present

## 2018-09-12 DIAGNOSIS — D631 Anemia in chronic kidney disease: Secondary | ICD-10-CM | POA: Insufficient documentation

## 2018-09-12 DIAGNOSIS — Z992 Dependence on renal dialysis: Secondary | ICD-10-CM | POA: Insufficient documentation

## 2018-09-12 DIAGNOSIS — I129 Hypertensive chronic kidney disease with stage 1 through stage 4 chronic kidney disease, or unspecified chronic kidney disease: Secondary | ICD-10-CM | POA: Diagnosis not present

## 2018-09-13 ENCOUNTER — Telehealth: Payer: Self-pay

## 2018-09-13 NOTE — Telephone Encounter (Signed)
I called [name] to verify that they were taking over the administration of Aranesp. I spoke to [place] and she stated they were, but patient was saying that Dr. Tasia Catchings told him to hold aranesp due to increased iron level. Per Dr. Tasia Catchings continue to take aranesp as it can help utilize iron. Angie notified and stated that he will get dose tomorrow.

## 2018-09-18 LAB — HEMOCHROMATOSIS DNA-PCR(C282Y,H63D)

## 2018-10-04 ENCOUNTER — Ambulatory Visit: Payer: Medicare Other | Admitting: Family Medicine

## 2018-10-25 ENCOUNTER — Ambulatory Visit: Payer: Medicare Other | Admitting: Family Medicine

## 2018-11-22 ENCOUNTER — Ambulatory Visit (INDEPENDENT_AMBULATORY_CARE_PROVIDER_SITE_OTHER): Payer: Medicare Other | Admitting: Family Medicine

## 2018-11-22 ENCOUNTER — Other Ambulatory Visit: Payer: Self-pay

## 2018-11-22 VITALS — Resp 12

## 2018-11-22 DIAGNOSIS — R369 Urethral discharge, unspecified: Secondary | ICD-10-CM | POA: Diagnosis not present

## 2018-11-22 DIAGNOSIS — Z8619 Personal history of other infectious and parasitic diseases: Secondary | ICD-10-CM | POA: Diagnosis not present

## 2018-11-22 DIAGNOSIS — I10 Essential (primary) hypertension: Secondary | ICD-10-CM | POA: Diagnosis not present

## 2018-11-22 MED ORDER — AZITHROMYCIN 500 MG PO TABS: 1000.0000 mg | ORAL_TABLET | Freq: Every day | ORAL | 0 refills | Status: AC

## 2018-11-22 NOTE — Progress Notes (Signed)
Virtual Visit via Video Note   I connected with Angel [name] on 11/24/18 at  9:00 AM EDT by a video enabled telemedicine application and verified that I am speaking with the correct person using two identifiers.  Location patient: home Location provider:work office Persons participating in the virtual visit: patient, provider  I discussed the limitations of evaluation and management by telemedicine and the availability of in person appointments. He expressed understanding and agreed to proceed.   HPI: Angel [name] is a 48 yo male with Hx of ESRD on dialysis,HLD,HTN,CHF,CVA,GERD, elevated ferritin, and AAA among some, being seen today to establish care. Former PCP: Dr Phillip Heal.  Today he is concerned about 2 weeks of intermittent urethral discharge. He doe snot want to provide details,it seems like somebody is with him in his room at home. He denies sex intercourse in several months. He knows symptoms are caused by "an infection" and requesting abx treatment.  States that he has been treated for gonorrhea in the past and symptoms are similar. Bilateral groin "knots", tender. Denies ulcers or genital lesions.  He still has urination. Denies dysuria,increased urinary frequency, gross hematuria,or decreased urine output.  He has not tried OTC medications.  Denies fever,chils,abdominal pain,vomiting,or changes in bowel habits. Occasional nausea,usually after dialysis and going on for a while.  HTN,he is on Hydralazine 100 mg tid,Coreg 25 mg bid,Clonidine 0.2 mg bid, and Avapro 150 mg daily. States that his BP is elevated due to elevated ferritin. He is following with hematologist. Denies Hx of hemochromatosis.  Hx of anemia and thrombocytopenia.   Lab Results  Component Value Date   WBC 7.9 08/21/2018   HGB 6.6 (LL) 08/21/2018   HCT 19.8 (L) 08/21/2018   MCV 83.5 08/21/2018   PLT 127 (L) 08/21/2018   Lab Results  Component Value Date   ALT 24 08/17/2018   AST 26 08/17/2018   ALKPHOS  101 08/17/2018   BILITOT 0.8 08/17/2018    Lab Results  Component Value Date   CREATININE 29.78 (H) 08/21/2018   BUN 118 (H) 08/21/2018   NA 140 08/21/2018   K 5.1 08/21/2018   CL 97 (L) 08/21/2018   CO2 23 08/21/2018    He denies unusual headache,visual changes,chest pain,dyspnea,cough,wheezing,or unusual edema. He is on Lasix 80 mg daily.  ROS: See pertinent positives and negatives per HPI.  Past Medical History:  Diagnosis Date  . AAA (abdominal aortic aneurysm) ([place])   . Chronic anemia   . Chronic combined systolic and diastolic CHF (congestive heart failure) ([place])   . End stage renal disease ([place])   . ESRD on dialysis ([place])   . FSGS (focal segmental glomerulosclerosis)   . GERD (gastroesophageal reflux disease)   . HLD (hyperlipidemia)   . Hypertension   . NSTEMI (non-ST elevated myocardial infarction) ([place]) 07/2017   pt refused cath  . Pericardial effusion    a. small by echo 06/2017.  Marland Kitchen Renal disorder   . Seizure ([place])   . Stroke (cerebrum) [facility])     Past Surgical History:  Procedure Laterality Date  . ABDOMINAL AORTIC ANEURYSM REPAIR    . KIDNEY SURGERY      Family History  Problem Relation Age of Onset  . Hypertension Mother   . Kidney disease Mother   . Asthma Father   . Hypertension Father   . Kidney disease Father   . Hypertension Sister   . Heart disease Sister   . Kidney disease Sister   . Heart disease Brother   .  Hypertension Brother   . COPD Brother     Social History   Socioeconomic History  . Marital status: Divorced    Spouse name: Not on file  . Number of children: 5  . Years of education: 16  . Highest education level: Bachelor's degree (e.g., BA, AB, BS)  Occupational History    Comment: unemployed  Social Needs  . Financial resource strain: Somewhat hard  . Food insecurity:    Worry: Never true    Inability: Never true  . Transportation needs:    Medical: Yes    Non-medical: Yes  Tobacco Use  . Smoking status:  Never Smoker  . Smokeless tobacco: Never Used  Substance and Sexual Activity  . Alcohol use: No    Frequency: Never  . Drug use: No  . Sexual activity: Not Currently  Lifestyle  . Physical activity:    Days per week: 0 days    Minutes per session: 0 min  . Stress: Not at all  Relationships  . Social connections:    Talks on phone: More than three times a week    Gets together: More than three times a week    Attends religious service: More than 4 times per year    Active member of club or organization: No    Attends meetings of clubs or organizations: Never    Relationship status: Divorced  . Intimate partner violence:    Fear of current or ex partner: No    Emotionally abused: No    Physically abused: No    Forced sexual activity: No  Other Topics Concern  . Not on file  Social History Narrative  . Not on file      Current Outpatient Medications:  .  amLODipine (NORVASC) 10 MG tablet, Take 10 mg by mouth daily., Disp: , Rfl:  .  aspirin EC 81 MG tablet, Take 81 mg daily by mouth., Disp: , Rfl:  .  atorvastatin (LIPITOR) 40 MG tablet, Take 40 mg at bedtime by mouth., Disp: , Rfl:  .  calcitRIOL (ROCALTROL) 0.5 MCG capsule, Take 0.5 mcg by mouth daily. , Disp: , Rfl:  .  carvedilol (COREG) 25 MG tablet, Take 25 mg 2 (two) times daily with a meal by mouth., Disp: , Rfl:  .  cholecalciferol (VITAMIN D) 1000 units tablet, Take 1,000 Units by mouth 2 (two) times daily., Disp: , Rfl:  .  cloNIDine (CATAPRES) 0.2 MG tablet, Take 0.2 mg by mouth 2 (two) times daily., Disp: , Rfl: 23 .  furosemide (LASIX) 80 MG tablet, Take 80 mg at bedtime by mouth., Disp: , Rfl:  .  hydrALAZINE (APRESOLINE) 100 MG tablet, Take 100 mg by mouth 3 (three) times daily., Disp: , Rfl:  .  hydrOXYzine (ATARAX/VISTARIL) 10 MG tablet, Take 10 mg by mouth daily as needed for itching or anxiety. , Disp: , Rfl:  .  irbesartan (AVAPRO) 150 MG tablet, Take 150 mg by mouth daily., Disp: , Rfl:  .   Lactobacillus Rhamnosus, GG, (CULTURELLE) CAPS, Take 1 capsule by mouth daily., Disp: , Rfl:  .  multivitamin (RENA-VIT) TABS tablet, Take 1 tablet by mouth 2 (two) times daily., Disp: , Rfl:  .  Omega-3 Fatty Acids (OMEGA III EPA+DHA) 1000 MG CAPS, Take 1,000 mg by mouth daily. , Disp: , Rfl:  .  pantoprazole (PROTONIX) 40 MG tablet, Take 40 mg by mouth daily. , Disp: , Rfl:  .  sevelamer carbonate (RENVELA) 800 MG tablet, Take 1,600 mg  by mouth See admin instructions. Take 1,600 mg by mouth three times a day with meals and 1,600 mg with each snack, Disp: , Rfl:  .  WHITE PETROLATUM-MINERAL OIL OP, Apply 1 application topically 2 (two) times daily as needed (itching). , Disp: , Rfl:   EXAM:  VITALS per patient if applicable:Resp 12   GENERAL: alert, oriented, appears well and in no acute distress  HEENT: atraumatic, conjunctiva clear, no obvious facial abnormalities on inspection.  NECK: normal movements of the head and neck  LUNGS: on inspection no signs of respiratory distress, breathing rate appears normal, no obvious gross SOB, gasping or wheezing  CV: no obvious cyanosis  MS: moves all visible extremities without noticeable abnormality  PSYCH/NEURO: pleasant and cooperative, no obvious depression or anxiety, speech and thought processing grossly intact  ASSESSMENT AND PLAN:  Discussed the following assessment and plan:  Urethral discharge We discussed possible etiologies. Denies ex intercourse in months. Denies urinary symptoms. Instructed about warning signs.  History of gonorrhea Given his Hx of gonorrhea,abx recommended. Because current COVID-19 pandemia we are option for empiric treatment but if symptoms continue he will need office visit and lab work. Educated about the possibility of abx resistance.  STD prevention tips given.   Hypertension, unspecified type No changes in current management. Instructed to monitor BP regularly,also taken before and after  dialysis. Continue low salt diet.   I discussed the assessment and treatment plan with the patient. He was provided an opportunity to ask questions and all were answered. The patient agreed with the plan and demonstrated an understanding of the instructions.   The patient was advised to call back or seek an in-person evaluation if the symptoms worsen or if the condition fails to improve as anticipated.  No follow-ups on file.    [place] Brogden [place], MD

## 2018-11-24 ENCOUNTER — Encounter: Payer: Self-pay | Admitting: Family Medicine

## 2018-11-26 ENCOUNTER — Ambulatory Visit: Payer: Medicare Other | Admitting: Oncology

## 2018-12-02 ENCOUNTER — Other Ambulatory Visit: Payer: Self-pay

## 2018-12-03 ENCOUNTER — Inpatient Hospital Stay: Payer: Medicare Other | Attending: Oncology | Admitting: Oncology

## 2018-12-03 ENCOUNTER — Encounter: Payer: Self-pay | Admitting: Oncology

## 2018-12-03 DIAGNOSIS — D631 Anemia in chronic kidney disease: Secondary | ICD-10-CM

## 2018-12-03 DIAGNOSIS — N186 End stage renal disease: Secondary | ICD-10-CM | POA: Diagnosis not present

## 2018-12-03 DIAGNOSIS — Z992 Dependence on renal dialysis: Secondary | ICD-10-CM

## 2018-12-03 NOTE — Progress Notes (Signed)
Patient contacted via phone for webex visit.

## 2018-12-03 NOTE — Progress Notes (Signed)
HEMATOLOGY-ONCOLOGY TeleHEALTH VISIT PROGRESS NOTE  I connected with Angel [name] on 12/03/18 at  1:15 PM EDT by video enabled telemedicine visit and verified that I am speaking with the correct person using two identifiers. I discussed the limitations, risks, security and privacy concerns of performing an evaluation and management service by telemedicine and the availability of in-person appointments. I also discussed with the patient that there may be a patient responsible charge related to this service. The patient expressed understanding and agreed to proceed.   Other persons participating in the visit and their role in the encounter:  Geraldine Solar, [place], check in patient   Janeann Merl, RN, check in patient.   Patient's location: Home  Provider's location: home Chief Complaint: Follow-up for anemia of chronic kidney disease, iron overload.   INTERVAL HISTORY Angel [name] is a 48 y.o. male who has above history reviewed by me today presents for follow up visit for anemia of chronic kidney disease and iron overload  Problems and complaints are listed below:  Patient is original appointment was in March 2020.  Due to COVID pandemic, his appointment was postponed. He had labs done on 10/29/2018 via [facility] which is scanned in epic's. Reviewed lab records from [facility]. 10/29/2018, ferritin 2062, decreased from 2394 in January., iron saturation decreased from 68 to 23. Patient reports that he has been more compliant with dialysis, with further questioning, he admits to missing some dialysis appointments.  He is getting erythropoietin replacement via nephrology. Reports that last blood transfusion was in March 2020. He reports most recent hemoglobin that was checked was 10.6.  Reports feeling less fatigue recently.  He is worried about his high iron levels.  No new complaint.   Review of Systems  Constitutional: Positive for fatigue. Negative for appetite change, chills and  fever.  HENT:   Negative for hearing loss and voice change.   Eyes: Negative for eye problems and icterus.  Respiratory: Negative for chest tightness, cough and shortness of breath.   Cardiovascular: Negative for chest pain and leg swelling.  Gastrointestinal: Negative for abdominal distention and abdominal pain.  Endocrine: Negative for hot flashes.  Genitourinary: Negative for difficulty urinating, dysuria and frequency.   Musculoskeletal: Negative for arthralgias.  Skin: Negative for itching and rash.  Neurological: Negative for light-headedness and numbness.  Hematological: Negative for adenopathy. Does not bruise/bleed easily.  Psychiatric/Behavioral: Negative for confusion.    Past Medical History:  Diagnosis Date  . AAA (abdominal aortic aneurysm) ([place])   . Chronic anemia   . Chronic combined systolic and diastolic CHF (congestive heart failure) ([place])   . End stage renal disease ([place])   . ESRD on dialysis ([place])   . FSGS (focal segmental glomerulosclerosis)   . GERD (gastroesophageal reflux disease)   . HLD (hyperlipidemia)   . Hypertension   . NSTEMI (non-ST elevated myocardial infarction) ([place]) 07/2017   pt refused cath  . Pericardial effusion    a. small by echo 06/2017.  Marland Kitchen Renal disorder   . Seizure ([place])   . Stroke (cerebrum) [facility])    Past Surgical History:  Procedure Laterality Date  . ABDOMINAL AORTIC ANEURYSM REPAIR    . KIDNEY SURGERY      Family History  Problem Relation Age of Onset  . Hypertension Mother   . Kidney disease Mother   . Asthma Father   . Hypertension Father   . Kidney disease Father   . Hypertension Sister   . Heart disease Sister   .  Kidney disease Sister   . Heart disease Brother   . Hypertension Brother   . COPD Brother     Social History   Socioeconomic History  . Marital status: Divorced    Spouse name: Not on file  . Number of children: 5  . Years of education: 16  . Highest education level: Bachelor's degree (e.g., BA,  AB, BS)  Occupational History    Comment: unemployed  Social Needs  . Financial resource strain: Somewhat hard  . Food insecurity:    Worry: Never true    Inability: Never true  . Transportation needs:    Medical: Yes    Non-medical: Yes  Tobacco Use  . Smoking status: Never Smoker  . Smokeless tobacco: Never Used  Substance and Sexual Activity  . Alcohol use: No    Frequency: Never  . Drug use: No  . Sexual activity: Not Currently  Lifestyle  . Physical activity:    Days per week: 0 days    Minutes per session: 0 min  . Stress: Not at all  Relationships  . Social connections:    Talks on phone: More than three times a week    Gets together: More than three times a week    Attends religious service: More than 4 times per year    Active member of club or organization: No    Attends meetings of clubs or organizations: Never    Relationship status: Divorced  . Intimate partner violence:    Fear of current or ex partner: No    Emotionally abused: No    Physically abused: No    Forced sexual activity: No  Other Topics Concern  . Not on file  Social History Narrative  . Not on file    Current Outpatient Medications on File Prior to Visit  Medication Sig Dispense Refill  . amLODipine (NORVASC) 10 MG tablet Take 10 mg by mouth daily.    Marland Kitchen aspirin EC 81 MG tablet Take 81 mg daily by mouth.    Marland Kitchen atorvastatin (LIPITOR) 40 MG tablet Take 40 mg at bedtime by mouth.    . calcitRIOL (ROCALTROL) 0.5 MCG capsule Take 0.5 mcg by mouth daily.     . carvedilol (COREG) 25 MG tablet Take 25 mg 2 (two) times daily with a meal by mouth.    . cholecalciferol (VITAMIN D) 1000 units tablet Take 1,000 Units by mouth 2 (two) times daily.    . cloNIDine (CATAPRES) 0.2 MG tablet Take 0.2 mg by mouth 2 (two) times daily.  23  . furosemide (LASIX) 80 MG tablet Take 80 mg at bedtime by mouth.    . hydrALAZINE (APRESOLINE) 100 MG tablet Take 100 mg by mouth 3 (three) times daily.    . hydrOXYzine  (ATARAX/VISTARIL) 10 MG tablet Take 10 mg by mouth daily as needed for itching or anxiety.     . irbesartan (AVAPRO) 150 MG tablet Take 150 mg by mouth daily.    . Lactobacillus Rhamnosus, GG, (CULTURELLE) CAPS Take 1 capsule by mouth daily.    . multivitamin (RENA-VIT) TABS tablet Take 1 tablet by mouth 2 (two) times daily.    . Omega-3 Fatty Acids (OMEGA III EPA+DHA) 1000 MG CAPS Take 1,000 mg by mouth daily.     . pantoprazole (PROTONIX) 40 MG tablet Take 40 mg by mouth daily.     . sevelamer carbonate (RENVELA) 800 MG tablet Take 1,600 mg by mouth See admin instructions. Take 1,600 mg by mouth three  times a day with meals and 1,600 mg with each snack    . WHITE PETROLATUM-MINERAL OIL OP Apply 1 application topically 2 (two) times daily as needed (itching).      No current facility-administered medications on file prior to visit.     Allergies  Allergen Reactions  . Clonidine Derivatives Swelling and Other (See Comments)    Leg swelling  . Hydralazine Hcl Swelling and Other (See Comments)    Leg swelling- patient wants this stopped as early as possible if it's been ordered- can only tolerate for a short window of time  . Losartan Potassium-Hctz Other (See Comments)    "Raised my potassium"       Observations/Objective: There were no vitals filed for this visit. There is no height or weight on file to calculate BMI.  Physical Exam  Constitutional: He is oriented to person, place, and time. No distress.  HENT:  Head: Normocephalic and atraumatic.  Pulmonary/Chest: Effort normal. No respiratory distress.  Neurological: He is alert and oriented to person, place, and time.  Psychiatric: Affect normal.    CBC    Component Value Date/Time   WBC 7.9 08/21/2018 0429   RBC 2.24 (L) 08/21/2018 0429   HGB 6.6 (LL) 08/21/2018 0639   HCT 19.8 (L) 08/21/2018 0639   HCT 21.5 (L) 06/25/2017 0321   PLT 127 (L) 08/21/2018 0429   MCV 83.5 08/21/2018 0429   MCH 29.0 08/21/2018 0429   MCHC  34.8 08/21/2018 0429   RDW 13.2 08/21/2018 0429   LYMPHSABS 1.5 06/20/2017 0036   MONOABS 0.6 06/20/2017 0036   EOSABS 0.3 06/20/2017 0036   BASOSABS 0.0 06/20/2017 0036    CMP     Component Value Date/Time   NA 140 08/21/2018 0429   K 5.1 08/21/2018 0639   CL 97 (L) 08/21/2018 0429   CO2 23 08/21/2018 0429   GLUCOSE 127 (H) 08/21/2018 0429   BUN 118 (H) 08/21/2018 0429   CREATININE 29.78 (H) 08/21/2018 0429   CALCIUM 8.7 (L) 08/21/2018 0429   PROT 7.7 08/17/2018 0613   ALBUMIN 3.7 08/20/2018 2259   AST 26 08/17/2018 0613   ALT 24 08/17/2018 0613   ALKPHOS 101 08/17/2018 0613   BILITOT 0.8 08/17/2018 0613   GFRNONAA 1 (L) 08/21/2018 0429   GFRAA 2 (L) 08/21/2018 0429     Assessment and Plan: 1. Iron overload due to repeated red blood cell transfusions   2. ESRD (end stage renal disease) on dialysis ([place])   3. Anemia due to end stage renal disease ([place])     Labs reviewed and discussed with patient His iron panel which was obtained on 10/29/2018 appears better with decreased ferritin level and decreased iron saturation. Discussed with patient that his ferritin level can be in combination of iron overload as well as due to chronic inflammatory process.  The fact that TSAT has decreased to 23 is reassuring.  Patient asked about chelating therapy for iron overload.  I discussed with patient that the reason of iron overload is due to multiple blood transfusions, which is a consequence of him being noncompliant with dialysis and erythropoietin treatments. Usually with each dialysis session, he loses small amount of blood which will gradually decrease his iron stores. Chelators have significant side effects and will be used as last resort.  He would not be a candidate for chelator due to his noncompliance.  I discussed with patient that he needs to demonstrate good compliance for dialysis first before being considered for  chelator.  And he may not need to be on chelator if he can be  compliant with dialysis which will serve as small amount of phlebotomy to decrease his iron level.   I recommend him to have very strict blood transfusion criteria in the future.  No blood transfusion unless hemoglobin is less than 7.   Follow Up Instructions: Follow-up in 2 months.   I discussed the assessment and treatment plan with the patient. The patient was provided an opportunity to ask questions and all were answered. The patient agreed with the plan and demonstrated an understanding of the instructions.  The patient was advised to call back or seek an in-person evaluation if the symptoms worsen or if the condition fails to improve as anticipated.    Earlie Server, MD 12/03/2018 11:13 PM

## 2018-12-04 ENCOUNTER — Telehealth: Payer: Self-pay

## 2018-12-04 NOTE — Telephone Encounter (Signed)
Called [name] and spoke to Angie. Left message with Angie so Dr. Juleen China can contact Dr. Tasia Catchings regarding this patient.

## 2018-12-25 ENCOUNTER — Ambulatory Visit: Payer: Medicare Other | Admitting: Oncology

## 2019-01-23 NOTE — Telephone Encounter (Signed)
Open in error

## 2019-01-31 ENCOUNTER — Other Ambulatory Visit: Payer: Self-pay

## 2019-02-03 ENCOUNTER — Encounter: Payer: Self-pay | Admitting: Oncology

## 2019-02-03 ENCOUNTER — Other Ambulatory Visit: Payer: Self-pay

## 2019-02-03 ENCOUNTER — Inpatient Hospital Stay: Payer: Medicare Other | Attending: Oncology | Admitting: Oncology

## 2019-02-03 DIAGNOSIS — Z992 Dependence on renal dialysis: Secondary | ICD-10-CM | POA: Diagnosis not present

## 2019-02-03 DIAGNOSIS — N186 End stage renal disease: Secondary | ICD-10-CM | POA: Insufficient documentation

## 2019-02-03 DIAGNOSIS — I5042 Chronic combined systolic (congestive) and diastolic (congestive) heart failure: Secondary | ICD-10-CM | POA: Diagnosis not present

## 2019-02-03 DIAGNOSIS — K219 Gastro-esophageal reflux disease without esophagitis: Secondary | ICD-10-CM | POA: Diagnosis not present

## 2019-02-03 DIAGNOSIS — I132 Hypertensive heart and chronic kidney disease with heart failure and with stage 5 chronic kidney disease, or end stage renal disease: Secondary | ICD-10-CM | POA: Insufficient documentation

## 2019-02-03 DIAGNOSIS — E785 Hyperlipidemia, unspecified: Secondary | ICD-10-CM | POA: Diagnosis not present

## 2019-02-03 DIAGNOSIS — Z79899 Other long term (current) drug therapy: Secondary | ICD-10-CM | POA: Insufficient documentation

## 2019-02-03 DIAGNOSIS — E631 Imbalance of constituents of food intake: Secondary | ICD-10-CM | POA: Diagnosis not present

## 2019-02-03 DIAGNOSIS — R21 Rash and other nonspecific skin eruption: Secondary | ICD-10-CM | POA: Insufficient documentation

## 2019-02-03 DIAGNOSIS — Z8673 Personal history of transient ischemic attack (TIA), and cerebral infarction without residual deficits: Secondary | ICD-10-CM | POA: Insufficient documentation

## 2019-02-03 DIAGNOSIS — R5383 Other fatigue: Secondary | ICD-10-CM | POA: Diagnosis not present

## 2019-02-03 DIAGNOSIS — Z7982 Long term (current) use of aspirin: Secondary | ICD-10-CM | POA: Insufficient documentation

## 2019-02-03 DIAGNOSIS — D631 Anemia in chronic kidney disease: Secondary | ICD-10-CM | POA: Insufficient documentation

## 2019-02-03 DIAGNOSIS — I252 Old myocardial infarction: Secondary | ICD-10-CM | POA: Insufficient documentation

## 2019-02-03 NOTE — Progress Notes (Signed)
Hematology/Oncology Follow up note [facility] Telephone:([phone] [phone]) [phone]   Patient Care Team: Gwenlyn Saran, MD as PCP - General (Family Medicine) Burnell Blanks, MD as PCP - Cardiology (Cardiology)  REFERRING PROVIDER: Dr.Singh Sheilah Mins REASON FOR VISIT Follow up for treatment of anemia  HISTORY OF PRESENTING ILLNESS:  Angel [name] is a  48 y.o.  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  Patient reports that he has been getting Epogen with dialysis for about a year and a half.  Recently he has developed skin rash on buttock, also field questionable throat itching after he received Epogen injection.  Epogen was hold.  With further questioning, patient reports using new body wash during the same time period.  Patient has anemia of chronic kidney disease and was referred to [facility] for further evaluation. Patient reports feeling fatigue.  Denies any hematochezia or hematemesis, black tarry stool.  His rash improved after topical steroid cream  Labs on  INTERVAL HISTORY Angel [name] is a 48 y.o. male who as above history reviewed by me today presents for follow-up visit for management of anemia secondary to chronic kidney disease, iron overload due to multiple blood transfusion.  Patient is on hemodialysis and gets Aranesp through dialysis at [facility]. Patient used to have compliance issue. He informs me that he has been more compliant with his dialysis recently.  Patient reports that recently he was given 1 dose of IV iron infusion along with dialysis.  Today he reports feeling well.  No new complaints.  Denies any pain.   Is on hemodialysis and getting his Aranesp through dialysis at [facility]. Patient was recently admitted from 08/20/2020 08/22/2018 due to missing almost 4 weeks of dialysis.  Patient was  urgently dialyzed, patient left AGAINST MEDICAL ADVICE Patient recalls history of 7-8 blood transfusions in the past.  He also recalls having received about 7 IV iron treatment along with his dialysis in the past.  Review of Systems  Constitutional: Positive for fatigue. Negative for appetite change, chills, fever and unexpected weight change.  HENT:   Negative for hearing loss and voice change.   Eyes: Negative for eye problems and icterus.  Respiratory: Negative for chest tightness, cough and shortness of breath.   Cardiovascular: Negative for chest pain and leg swelling.  Gastrointestinal: Negative for abdominal distention and abdominal pain.  Endocrine: Negative for hot flashes.  Genitourinary: Negative for difficulty urinating, dysuria and frequency.   Musculoskeletal: Negative for arthralgias.  Skin: Negative for itching and rash.  Neurological: Negative for light-headedness and numbness.  Hematological: Negative for adenopathy. Does not bruise/bleed easily.  Psychiatric/Behavioral: Negative for confusion.   MEDICAL HISTORY:  Past Medical History:  Diagnosis Date  . AAA (abdominal aortic aneurysm) ([place])   . Chronic anemia   . Chronic combined systolic and diastolic CHF (congestive heart failure) ([place])   . End stage renal disease ([place])   . ESRD on dialysis ([place])   . FSGS (focal segmental glomerulosclerosis)   . GERD (gastroesophageal reflux disease)   . HLD (hyperlipidemia)   . Hypertension   . NSTEMI (non-ST elevated myocardial infarction) ([place]) 07/2017   pt refused cath  . Pericardial effusion    a. small by echo 06/2017.  Marland Kitchen Renal disorder   . Seizure ([place])   . Stroke (cerebrum) [facility])     SURGICAL HISTORY: Past  Surgical History:  Procedure Laterality Date  . ABDOMINAL AORTIC ANEURYSM REPAIR    . KIDNEY SURGERY      SOCIAL HISTORY: Social History   Socioeconomic History  . Marital status: Divorced    Spouse name: Not on file  . Number of children: 5  . Years  of education: 16  . Highest education level: Bachelor's degree (e.g., BA, AB, BS)  Occupational History    Comment: unemployed  Social Needs  . Financial resource strain: Somewhat hard  . Food insecurity    Worry: Never true    Inability: Never true  . Transportation needs    Medical: Yes    Non-medical: Yes  Tobacco Use  . Smoking status: Never Smoker  . Smokeless tobacco: Never Used  Substance and Sexual Activity  . Alcohol use: No    Frequency: Never  . Drug use: No  . Sexual activity: Not Currently  Lifestyle  . Physical activity    Days per week: 0 days    Minutes per session: 0 min  . Stress: Not at all  Relationships  . Social connections    Talks on phone: More than three times a week    Gets together: More than three times a week    Attends religious service: More than 4 times per year    Active member of club or organization: No    Attends meetings of clubs or organizations: Never    Relationship status: Divorced  . Intimate partner violence    Fear of current or ex partner: No    Emotionally abused: No    Physically abused: No    Forced sexual activity: No  Other Topics Concern  . Not on file  Social History Narrative  . Not on file    FAMILY HISTORY: Family History  Problem Relation Age of Onset  . Hypertension Mother   . Kidney disease Mother   . Asthma Father   . Hypertension Father   . Kidney disease Father   . Hypertension Sister   . Heart disease Sister   . Kidney disease Sister   . Heart disease Brother   . Hypertension Brother   . COPD Brother     ALLERGIES:  is allergic to clonidine derivatives; hydralazine hcl; and losartan potassium-hctz.  MEDICATIONS:  Current Outpatient Medications  Medication Sig Dispense Refill  . amLODipine (NORVASC) 10 MG tablet Take 10 mg by mouth daily.    Marland Kitchen aspirin EC 81 MG tablet Take 81 mg daily by mouth.    Marland Kitchen atorvastatin (LIPITOR) 40 MG tablet Take 40 mg at bedtime by mouth.    . calcitRIOL  (ROCALTROL) 0.5 MCG capsule Take 0.5 mcg by mouth daily.     . carvedilol (COREG) 25 MG tablet Take 25 mg 2 (two) times daily with a meal by mouth.    . cholecalciferol (VITAMIN D) 1000 units tablet Take 1,000 Units by mouth 2 (two) times daily.    . cloNIDine (CATAPRES) 0.2 MG tablet Take 0.2 mg by mouth 2 (two) times daily.  23  . furosemide (LASIX) 80 MG tablet Take 80 mg at bedtime by mouth.    . hydrALAZINE (APRESOLINE) 100 MG tablet Take 100 mg by mouth 3 (three) times daily.    . hydrOXYzine (ATARAX/VISTARIL) 10 MG tablet Take 10 mg by mouth daily as needed for itching or anxiety.     . irbesartan (AVAPRO) 150 MG tablet Take 150 mg by mouth daily.    . Lactobacillus Rhamnosus, GG, (  CULTURELLE) CAPS Take 1 capsule by mouth daily.    . multivitamin (RENA-VIT) TABS tablet Take 1 tablet by mouth 2 (two) times daily.    . Omega-3 Fatty Acids (OMEGA III EPA+DHA) 1000 MG CAPS Take 1,000 mg by mouth daily.     . pantoprazole (PROTONIX) 40 MG tablet Take 40 mg by mouth daily.     . sevelamer carbonate (RENVELA) 800 MG tablet Take 1,600 mg by mouth See admin instructions. Take 1,600 mg by mouth three times a day with meals and 1,600 mg with each snack    . WHITE PETROLATUM-MINERAL OIL OP Apply 1 application topically 2 (two) times daily as needed (itching).     . OLANZapine (ZYPREXA) 10 MG tablet Take 10 mg by mouth daily.    . tamsulosin (FLOMAX) 0.4 MG CAPS capsule TAKE 1 CAPSULE BY MOUTH EVERYDAY AT BEDTIME     No current facility-administered medications for this visit.      PHYSICAL EXAMINATION: ECOG PERFORMANCE STATUS: 1 - Symptomatic but completely ambulatory Vitals:   02/03/19 1333  BP: (!) 192/141  Pulse: 91  Resp: 18  Temp: 98.1 F (36.7 C)   Filed Weights   02/03/19 1333  Weight: 134 lb 11.2 oz (61.1 kg)    Physical Exam Constitutional:      General: He is not in acute distress.    Appearance: He is well-developed. He is ill-appearing.  HENT:     Head: Normocephalic  and atraumatic.     Right Ear: External ear normal.     Left Ear: External ear normal.  Eyes:     General: No scleral icterus.    Pupils: Pupils are equal, round, and reactive to light.  Neck:     Musculoskeletal: Normal range of motion and neck supple.  Cardiovascular:     Rate and Rhythm: Normal rate and regular rhythm.     Heart sounds: Normal heart sounds.  Pulmonary:     Effort: Pulmonary effort is normal. No respiratory distress.     Breath sounds: Normal breath sounds. No wheezing or rales.  Chest:     Chest wall: No tenderness.  Abdominal:     General: Bowel sounds are normal. There is no distension.     Palpations: Abdomen is soft. There is no mass.     Tenderness: There is no abdominal tenderness. There is no guarding.  Musculoskeletal: Normal range of motion.        General: No deformity.  Lymphadenopathy:     Cervical: No cervical adenopathy.  Skin:    General: Skin is warm and dry.     Coloration: Skin is pale.     Findings: No erythema or rash.  Neurological:     Mental Status: He is alert and oriented to person, place, and time.     Cranial Nerves: No cranial nerve deficit.     Coordination: Coordination normal.     Deep Tendon Reflexes: Reflexes normal.  Psychiatric:        Behavior: Behavior normal.        Thought Content: Thought content normal.      LABORATORY DATA:  I have reviewed the data as listed Lab Results  Component Value Date   WBC 7.9 08/21/2018   HGB 6.6 (LL) 08/21/2018   HCT 19.8 (L) 08/21/2018   MCV 83.5 08/21/2018   PLT 127 (L) 08/21/2018   Recent Labs    04/04/18 0147 04/05/18 0900 08/17/18 4196  08/20/18 1703 08/20/18 2117 08/20/18 2259 08/21/18 2229  08/21/18 0639  NA 139 137 141   < > 137 138 141 140  --   K 5.2* 4.1 >7.5*   < > 7.4* 4.0 4.6 4.9 5.1  CL 97* 95* 94*   < > 93* 98 95* 97*  --   CO2 24 29 15*   < > 15*  --  21* 23  --   GLUCOSE 96 111* 99   < > 231* 85 109* 127*  --   BUN 89* 31* 171*   < > 191* 102*  114* 118*  --   CREATININE 18.56* 9.94* 38.66*   < > 43.96* >18.00* 27.49* 29.78*  --   CALCIUM 9.1 9.6 9.7   < > 8.3*  --  9.2 8.7*  --   GFRNONAA 3* 5* 1*   < > 1*  --  2* 1*  --   GFRAA 3* 6* 1*   < > 1*  --  2* 2*  --   PROT 6.3*  --  7.7  --   --   --   --   --   --   ALBUMIN 3.2* 3.3* 3.9  --   --   --  3.7  --   --   AST 21  --  26  --   --   --   --   --   --   ALT 16  --  24  --   --   --   --   --   --   ALKPHOS 76  --  101  --   --   --   --   --   --   BILITOT 1.0  --  0.8  --   --   --   --   --   --    < > = values in this interval not displayed.   Labs from [place] reviewed by me. 11/23/17.  Hemoglobin 8.5, vitamin B12 1199, folate 12.8, ferritin 1878.  Iron 59, TIBC 240.  WBC 6, MCV 88.6, platelet counts 2 41,000.  TSH 2.97 01/03/18   Hemoglobin 8.9, iron saturation 73, iron 160, TIBC 219, wbc 5.5, platelet 232,000,  01/07/2018 Hemoglobin 9.1 ferritin 1924, Iron saturation 38, TIBC 221 01/14/18  Hemoglobin 8.8, Hct 26.4. 03/06/2018 hemoglobin 7.1, HCT 22.4. 03/20/2018 hemoglobin 7.1 03/26/2018, ferritin 1690, saturation ratio 14.   08/21/2018 showed hemoglobin 6.5, hematocrit 18.7, platelet count 127. 08/17/2018, normal AST and ALT.  Normal albumin. 08/20/2018, ferritin 2105, iron saturation 63.  TIBC 183. 01/16/19 20 hemoglobin 7.8 Iron saturation 23, iron 44, TIBC 192.    ASSESSMENT & PLAN:  1. Iron overload due to repeated red blood cell transfusions   # Anemia of CKD, patient gets Aranesp via Dialysis at [facility].  His hemoglobin has been between high sevens to low 8. Continue follow-up with nephrologist for Aranesp along with dialysis.  # Iron overload, likely due to multiple blood transfusion.  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 Recent iron saturation 23%. We discussed again about the management of patients iron overload. Ferritin is increased, partially secondary to chronic inflammation as well as over load due to multiple blood transfusion.  Encourage patient to continue to be compliant with dialysis and erythropoietin therapy treatments. Usually with each dialysis session, he loses small amount of blood which will gradually decrease his iron stores. Chelators have significant side effects and will be used as a last resort.  He would not be in a  candidate for chelator due to his noncompliance. Recommend patient to avoid IV iron treatments at this point.  Strict blood transfusion criteria in the future.  No blood transfusion unless hemoglobin is less than 7.   Happy to discuss with patient's nephrologist if there are any questions.  Obtain CBC, iron, TIBC ferritin and AFP in 6 months. Obtain ultrasound liver for surveillance.   The patient knows to call the clinic with any problems questions or concerns. Cc Dr.Singh Return of visit: pending MRI results.  Earlie Server, MD, PhD 02/03/2019

## 2019-02-03 NOTE — Progress Notes (Signed)
Patient here for follow up. Blood pressure elevated. States he has not taken blood today. Denies headache, dizziness.

## 2019-02-24 ENCOUNTER — Telehealth: Payer: Self-pay | Admitting: *Deleted

## 2019-02-24 NOTE — Telephone Encounter (Signed)
I spoke with Hinton Dyer RN at [facility] and she took VO to restart his Epogen and to check with nephrologist for the dose. She states he has requested his records 5 times and has not received them yet so she will start him at 10000 units until she gets records

## 2019-02-24 NOTE — Telephone Encounter (Signed)
Correction: He used to get Aranesp not procrit.  I will call

## 2019-02-24 NOTE — Telephone Encounter (Signed)
Patient PCP office called reporting that his hgb is 6.9 and they want [place] to contact patient with treatment plan for this.

## 2019-02-24 NOTE — Telephone Encounter (Signed)
Discussed with Dr Tasia Catchings and she asked that I call [name] to get them to give Epogen as his nephrologist said he would take it over, patient is fairly new to [place]. I called [name] and was asked if they could call me back as the nurse is on the phone

## 2019-02-24 NOTE — Telephone Encounter (Addendum)
He has not been getting it there because they only offer Epogen which patient told them he cannot get (per [name] he told them Hematologist told him he cannot get it due to iron overload and they assumed we were giving him Procrit) Pleases call patient toclarify if this is incorrect and that he can get Epogen at [facility] and inform [name] that he can get it, she said that that is not a problem to give Epogen ad that is all they carry [phone]

## 2019-02-24 NOTE — Telephone Encounter (Signed)
Can you notify his nephrologist? He is a HD patient with no IV access.  If he has not been complaint with his procrit [ used to have compliance issue], I recommend him to get procrit injections first, as his ferritin has been high and is being watched.   If he has been compliant and still with low Hb, recommend 1 unit of PRBC transfusion.

## 2019-02-26 ENCOUNTER — Telehealth: Payer: Self-pay | Admitting: *Deleted

## 2019-02-26 NOTE — Telephone Encounter (Signed)
Thank.you. I have called patient earlier and explained and he appreciates explanation. He informs me that he had received erythropoietin treatment on 02/25/2019.

## 2019-02-26 NOTE — Telephone Encounter (Signed)
I called [facility] [address] where he gets his treatments and spoke with Hinton Dyer she took VO for CBC, IIBC, Ferr to be drawn the last week of August and gave her our fax number to send results for his 04/08/19 appointment. She repeated labs back to me and the fax number as well

## 2019-03-04 ENCOUNTER — Ambulatory Visit: Payer: Medicare Other

## 2019-04-07 ENCOUNTER — Encounter: Payer: Self-pay | Admitting: Oncology

## 2019-04-07 ENCOUNTER — Other Ambulatory Visit: Payer: Self-pay

## 2019-04-07 NOTE — Progress Notes (Signed)
Patient denies any concerns today. Patient reports they have been adjusting his dose of epogen at dialysis due to his blood pressure being elevated.

## 2019-04-08 ENCOUNTER — Other Ambulatory Visit: Payer: Self-pay

## 2019-04-08 ENCOUNTER — Inpatient Hospital Stay: Payer: Medicare Other | Attending: Oncology | Admitting: Oncology

## 2019-04-08 VITALS — BP 167/111 | HR 85 | Temp 97.7°F | Wt 133.0 lb

## 2019-04-08 DIAGNOSIS — I5042 Chronic combined systolic (congestive) and diastolic (congestive) heart failure: Secondary | ICD-10-CM | POA: Insufficient documentation

## 2019-04-08 DIAGNOSIS — Z7982 Long term (current) use of aspirin: Secondary | ICD-10-CM | POA: Diagnosis not present

## 2019-04-08 DIAGNOSIS — I252 Old myocardial infarction: Secondary | ICD-10-CM | POA: Diagnosis not present

## 2019-04-08 DIAGNOSIS — K219 Gastro-esophageal reflux disease without esophagitis: Secondary | ICD-10-CM | POA: Insufficient documentation

## 2019-04-08 DIAGNOSIS — Z79899 Other long term (current) drug therapy: Secondary | ICD-10-CM | POA: Insufficient documentation

## 2019-04-08 DIAGNOSIS — Z992 Dependence on renal dialysis: Secondary | ICD-10-CM

## 2019-04-08 DIAGNOSIS — Z8673 Personal history of transient ischemic attack (TIA), and cerebral infarction without residual deficits: Secondary | ICD-10-CM | POA: Insufficient documentation

## 2019-04-08 DIAGNOSIS — N186 End stage renal disease: Secondary | ICD-10-CM | POA: Insufficient documentation

## 2019-04-08 DIAGNOSIS — R5383 Other fatigue: Secondary | ICD-10-CM | POA: Insufficient documentation

## 2019-04-08 DIAGNOSIS — I132 Hypertensive heart and chronic kidney disease with heart failure and with stage 5 chronic kidney disease, or end stage renal disease: Secondary | ICD-10-CM | POA: Insufficient documentation

## 2019-04-08 DIAGNOSIS — E785 Hyperlipidemia, unspecified: Secondary | ICD-10-CM | POA: Insufficient documentation

## 2019-04-08 DIAGNOSIS — D631 Anemia in chronic kidney disease: Secondary | ICD-10-CM | POA: Diagnosis not present

## 2019-04-09 NOTE — Progress Notes (Signed)
Hematology/Oncology Follow up note [facility] Telephone:([phone] Fax:([phone]) [phone]   Patient Care Team: Gwenlyn Saran, MD as PCP - General (Family Medicine) Burnell Blanks, MD as PCP - Cardiology (Cardiology)  REFERRING PROVIDER: Dr.Singh Sheilah Mins REASON FOR VISIT Follow up for treatment of anemia  HISTORY OF PRESENTING ILLNESS:  Angel [name] is a  48 y.o.  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  Patient reports that he has been getting Epogen with dialysis for about a year and a half.  Recently he has developed skin rash on buttock, also field questionable throat itching after he received Epogen injection.  Epogen was hold.  With further questioning, patient reports using new body wash during the same time period.  Patient has anemia of chronic kidney disease and was referred to [facility] for further evaluation. Patient reports feeling fatigue.  Denies any hematochezia or hematemesis, black tarry stool.  His rash improved after topical steroid cream  Patient was recently admitted from 08/20/2020 08/22/2018 due to missing almost 4 weeks of dialysis.  Patient was urgently dialyzed, patient left AGAINST MEDICAL ADVICE Patient recalls history of 7-8 blood transfusions in the past.  He also recalls having received about 7 IV iron treatment along with his dialysis in the past.   INTERVAL HISTORY Angel [name] is a 48 y.o. male who as above history reviewed by me today presents for follow-up visit for management of anemia secondary to chronic kidney disease, iron overload due to multiple blood transfusion. Patient has been on hemodialysis and gets Epogen through dialysis at [facility]. Due to lack of IV access, patient has been getting labs done at [facility]  He also reports that he follows up with his primary care provider in [place]  and had blood work done. Medical records from [facility] was reviewed from care everywhere. 03/04/2019 hemoglobin 7.2, MCV 87, WBC 3.5, differential normal. Labs from [name] was scanned in after patient's visit. Patient reports feeling at baseline.  No new complaints. Reports to be compliant with his dialysis sessions.    Review of Systems  Constitutional: Positive for fatigue. Negative for appetite change, chills, fever and unexpected weight change.  HENT:   Negative for hearing loss and voice change.   Eyes: Negative for eye problems and icterus.  Respiratory: Negative for chest tightness, cough and shortness of breath.   Cardiovascular: Negative for chest pain and leg swelling.  Gastrointestinal: Negative for abdominal distention and abdominal pain.  Endocrine: Negative for hot flashes.  Genitourinary: Negative for difficulty urinating, dysuria and frequency.   Musculoskeletal: Negative for arthralgias.  Skin: Negative for itching and rash.  Neurological: Negative for light-headedness and numbness.  Hematological: Negative for adenopathy. Does not bruise/bleed easily.  Psychiatric/Behavioral: Negative for confusion.   MEDICAL HISTORY:  Past Medical History:  Diagnosis Date  . AAA (abdominal aortic aneurysm) ([place])   . Chronic anemia   . Chronic combined systolic and diastolic CHF (congestive heart failure) ([place])   . End stage renal disease ([place])   . ESRD on dialysis ([place])   . FSGS (focal segmental glomerulosclerosis)   . GERD (gastroesophageal reflux disease)   . HLD (hyperlipidemia)   . Hypertension   . NSTEMI (non-ST elevated myocardial infarction) ([place]) 07/2017   pt refused cath  . Pericardial effusion    a. small by echo 06/2017.  Marland Kitchen Renal disorder   .  Seizure ([place])   . Stroke (cerebrum) [facility])     SURGICAL HISTORY: Past Surgical History:  Procedure Laterality Date  . ABDOMINAL AORTIC ANEURYSM REPAIR    . KIDNEY SURGERY      SOCIAL HISTORY: Social History    Socioeconomic History  . Marital status: Divorced    Spouse name: Not on file  . Number of children: 5  . Years of education: 16  . Highest education level: Bachelor's degree (e.g., BA, AB, BS)  Occupational History    Comment: unemployed  Social Needs  . Financial resource strain: Somewhat hard  . Food insecurity    Worry: Never true    Inability: Never true  . Transportation needs    Medical: Yes    Non-medical: Yes  Tobacco Use  . Smoking status: Never Smoker  . Smokeless tobacco: Never Used  Substance and Sexual Activity  . Alcohol use: No    Frequency: Never  . Drug use: No  . Sexual activity: Not Currently  Lifestyle  . Physical activity    Days per week: 0 days    Minutes per session: 0 min  . Stress: Not at all  Relationships  . Social connections    Talks on phone: More than three times a week    Gets together: More than three times a week    Attends religious service: More than 4 times per year    Active member of club or organization: No    Attends meetings of clubs or organizations: Never    Relationship status: Divorced  . Intimate partner violence    Fear of current or ex partner: No    Emotionally abused: No    Physically abused: No    Forced sexual activity: No  Other Topics Concern  . Not on file  Social History Narrative  . Not on file    FAMILY HISTORY: Family History  Problem Relation Age of Onset  . Hypertension Mother   . Kidney disease Mother   . Asthma Father   . Hypertension Father   . Kidney disease Father   . Hypertension Sister   . Heart disease Sister   . Kidney disease Sister   . Heart disease Brother   . Hypertension Brother   . COPD Brother     ALLERGIES:  is allergic to clonidine derivatives; hydralazine hcl; and losartan potassium-hctz.  MEDICATIONS:  Current Outpatient Medications  Medication Sig Dispense Refill  . amLODipine (NORVASC) 10 MG tablet Take 10 mg by mouth daily.    Marland Kitchen aspirin EC 81 MG tablet Take 81  mg daily by mouth.    Marland Kitchen atorvastatin (LIPITOR) 40 MG tablet Take 40 mg at bedtime by mouth.    . calcitRIOL (ROCALTROL) 0.5 MCG capsule Take 0.5 mcg by mouth daily.     . carvedilol (COREG) 25 MG tablet Take 25 mg 2 (two) times daily with a meal by mouth.    . cholecalciferol (VITAMIN D) 1000 units tablet Take 1,000 Units by mouth 2 (two) times daily.    . cloNIDine (CATAPRES) 0.2 MG tablet Take 0.2 mg by mouth 2 (two) times daily.  23  . furosemide (LASIX) 80 MG tablet Take 80 mg at bedtime by mouth.    . hydrALAZINE (APRESOLINE) 100 MG tablet Take 100 mg by mouth 3 (three) times daily.    . hydrOXYzine (ATARAX/VISTARIL) 10 MG tablet Take 10 mg by mouth daily as needed for itching or anxiety.     . irbesartan (AVAPRO) 150  MG tablet Take 150 mg by mouth daily.    . Lactobacillus Rhamnosus, GG, (CULTURELLE) CAPS Take 1 capsule by mouth daily.    . multivitamin (RENA-VIT) TABS tablet Take 1 tablet by mouth 2 (two) times daily.    Marland Kitchen OLANZapine (ZYPREXA) 10 MG tablet Take 10 mg by mouth daily.    . Omega-3 Fatty Acids (OMEGA III EPA+DHA) 1000 MG CAPS Take 1,000 mg by mouth daily.     . pantoprazole (PROTONIX) 40 MG tablet Take 40 mg by mouth daily.     . sevelamer carbonate (RENVELA) 800 MG tablet Take 1,600 mg by mouth See admin instructions. Take 1,600 mg by mouth three times a day with meals and 1,600 mg with each snack    . tamsulosin (FLOMAX) 0.4 MG CAPS capsule TAKE 1 CAPSULE BY MOUTH EVERYDAY AT BEDTIME    . WHITE PETROLATUM-MINERAL OIL OP Apply 1 application topically 2 (two) times daily as needed (itching).      No current facility-administered medications for this visit.      PHYSICAL EXAMINATION: ECOG PERFORMANCE STATUS: 1 - Symptomatic but completely ambulatory Vitals:   04/08/19 1328  BP: (!) 167/111  Pulse: 85  Temp: 97.7 F (36.5 C)   Filed Weights   04/08/19 1328  Weight: 133 lb (60.3 kg)    Physical Exam Constitutional:      General: He is not in acute distress.     Appearance: He is well-developed. He is not ill-appearing.  HENT:     Head: Normocephalic and atraumatic.     Right Ear: External ear normal.     Left Ear: External ear normal.  Eyes:     General: No scleral icterus.    Pupils: Pupils are equal, round, and reactive to light.  Neck:     Musculoskeletal: Normal range of motion and neck supple.  Cardiovascular:     Rate and Rhythm: Normal rate and regular rhythm.     Heart sounds: Normal heart sounds.  Pulmonary:     Effort: Pulmonary effort is normal. No respiratory distress.     Breath sounds: Normal breath sounds. No wheezing or rales.  Chest:     Chest wall: No tenderness.  Abdominal:     General: Bowel sounds are normal. There is no distension.     Palpations: Abdomen is soft. There is no mass.     Tenderness: There is no abdominal tenderness. There is no guarding.  Musculoskeletal: Normal range of motion.        General: No deformity.  Lymphadenopathy:     Cervical: No cervical adenopathy.  Skin:    General: Skin is warm and dry.     Coloration: Skin is pale.     Findings: No erythema or rash.  Neurological:     Mental Status: He is alert and oriented to person, place, and time.     Cranial Nerves: No cranial nerve deficit.     Coordination: Coordination normal.     Deep Tendon Reflexes: Reflexes normal.  Psychiatric:        Behavior: Behavior normal.        Thought Content: Thought content normal.      LABORATORY DATA:  I have reviewed the data as listed Lab Results  Component Value Date   WBC 7.9 08/21/2018   HGB 6.6 (LL) 08/21/2018   HCT 19.8 (L) 08/21/2018   MCV 83.5 08/21/2018   PLT 127 (L) 08/21/2018   Recent Labs    08/17/18 0613  08/20/18  1703 08/20/18 2117 08/20/18 2259 08/21/18 0429 08/21/18 0639  NA 141   < > 137 138 141 140  --   K >7.5*   < > 7.4* 4.0 4.6 4.9 5.1  CL 94*   < > 93* 98 95* 97*  --   CO2 15*   < > 15*  --  21* 23  --   GLUCOSE 99   < > 231* 85 109* 127*  --   BUN 171*   <  > 191* 102* 114* 118*  --   CREATININE 38.66*   < > 43.96* >18.00* 27.49* 29.78*  --   CALCIUM 9.7   < > 8.3*  --  9.2 8.7*  --   GFRNONAA 1*   < > 1*  --  2* 1*  --   GFRAA 1*   < > 1*  --  2* 2*  --   PROT 7.7  --   --   --   --   --   --   ALBUMIN 3.9  --   --   --  3.7  --   --   AST 26  --   --   --   --   --   --   ALT 24  --   --   --   --   --   --   ALKPHOS 101  --   --   --   --   --   --   BILITOT 0.8  --   --   --   --   --   --    < > = values in this interval not displayed.   Labs from [facility] reviewed by me. 11/23/17.  Hemoglobin 8.5, vitamin B12 1199, folate 12.8, ferritin 1878.  Iron 59, TIBC 240.  WBC 6, MCV 88.6, platelet counts 2 41,000.  TSH 2.97 01/03/18   Hemoglobin 8.9, iron saturation 73, iron 160, TIBC 219, wbc 5.5, platelet 232,000,  01/07/2018 Hemoglobin 9.1 ferritin 1924, Iron saturation 38, TIBC 221 01/14/18  Hemoglobin 8.8, Hct 26.4. 03/06/2018 hemoglobin 7.1, HCT 22.4. 03/20/2018 hemoglobin 7.1 03/26/2018, ferritin 1690, saturation ratio 14.   08/21/2018 showed hemoglobin 6.5, hematocrit 18.7, platelet count 127. 08/17/2018, normal AST and ALT.  Normal albumin. 08/20/2018, ferritin 2105, iron saturation 63.  TIBC 183. 01/16/19 20 hemoglobin 7.8 Iron saturation 23, iron 44, TIBC 192.    ASSESSMENT & PLAN:  1. ESRD (end stage renal disease) on dialysis ([place])   2. Anemia due to end stage renal disease ([employer])   3. Iron overload due to repeated red blood cell transfusions   # Anemia of CKD, patient gets Epogen via Dialysis at [facility].  Labs from [facility] was reviewed and discussed with patient. Labs from [name] was available after patient left clinic and was reviewed. 03/20/2019, hematocrit 24, hemoglobin 8.  Iron saturation 12, iron 26, TIBC 216, no ferritin was done. WBC 4.4, MCV 90, platelet count 237,000. Patient hemoglobin has been stable. Recommend patient continue follow-up with nephrology/dialysis and continue erythropoietin replacement therapy  via dialysis.   # Iron overload, likely due to multiple blood transfusion.  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 Recent iron saturation is 12%, improved.  I do not have ferritin levels. Discussed with patient that as expected, as long as he is compliant with dialysis/erythropoietin therapy, his iron overload will gradually improve.  Iron labs were showing improvement.  Continue monitoring. Ultrasound was ordered but patient did  not do it.    The patient knows to call the clinic with any problems questions or concerns.  Return of visit 3 months.  Earlie Server, MD, PhD 04/09/2019

## 2019-05-22 IMAGING — CR DG CHEST 2V
2 series · 2 of 2 positions shown · non-contrast
Comparison: None.

CLINICAL DATA: Acute onset of shortness of breath.

EXAM:
CHEST  2 VIEW

[chest pa]
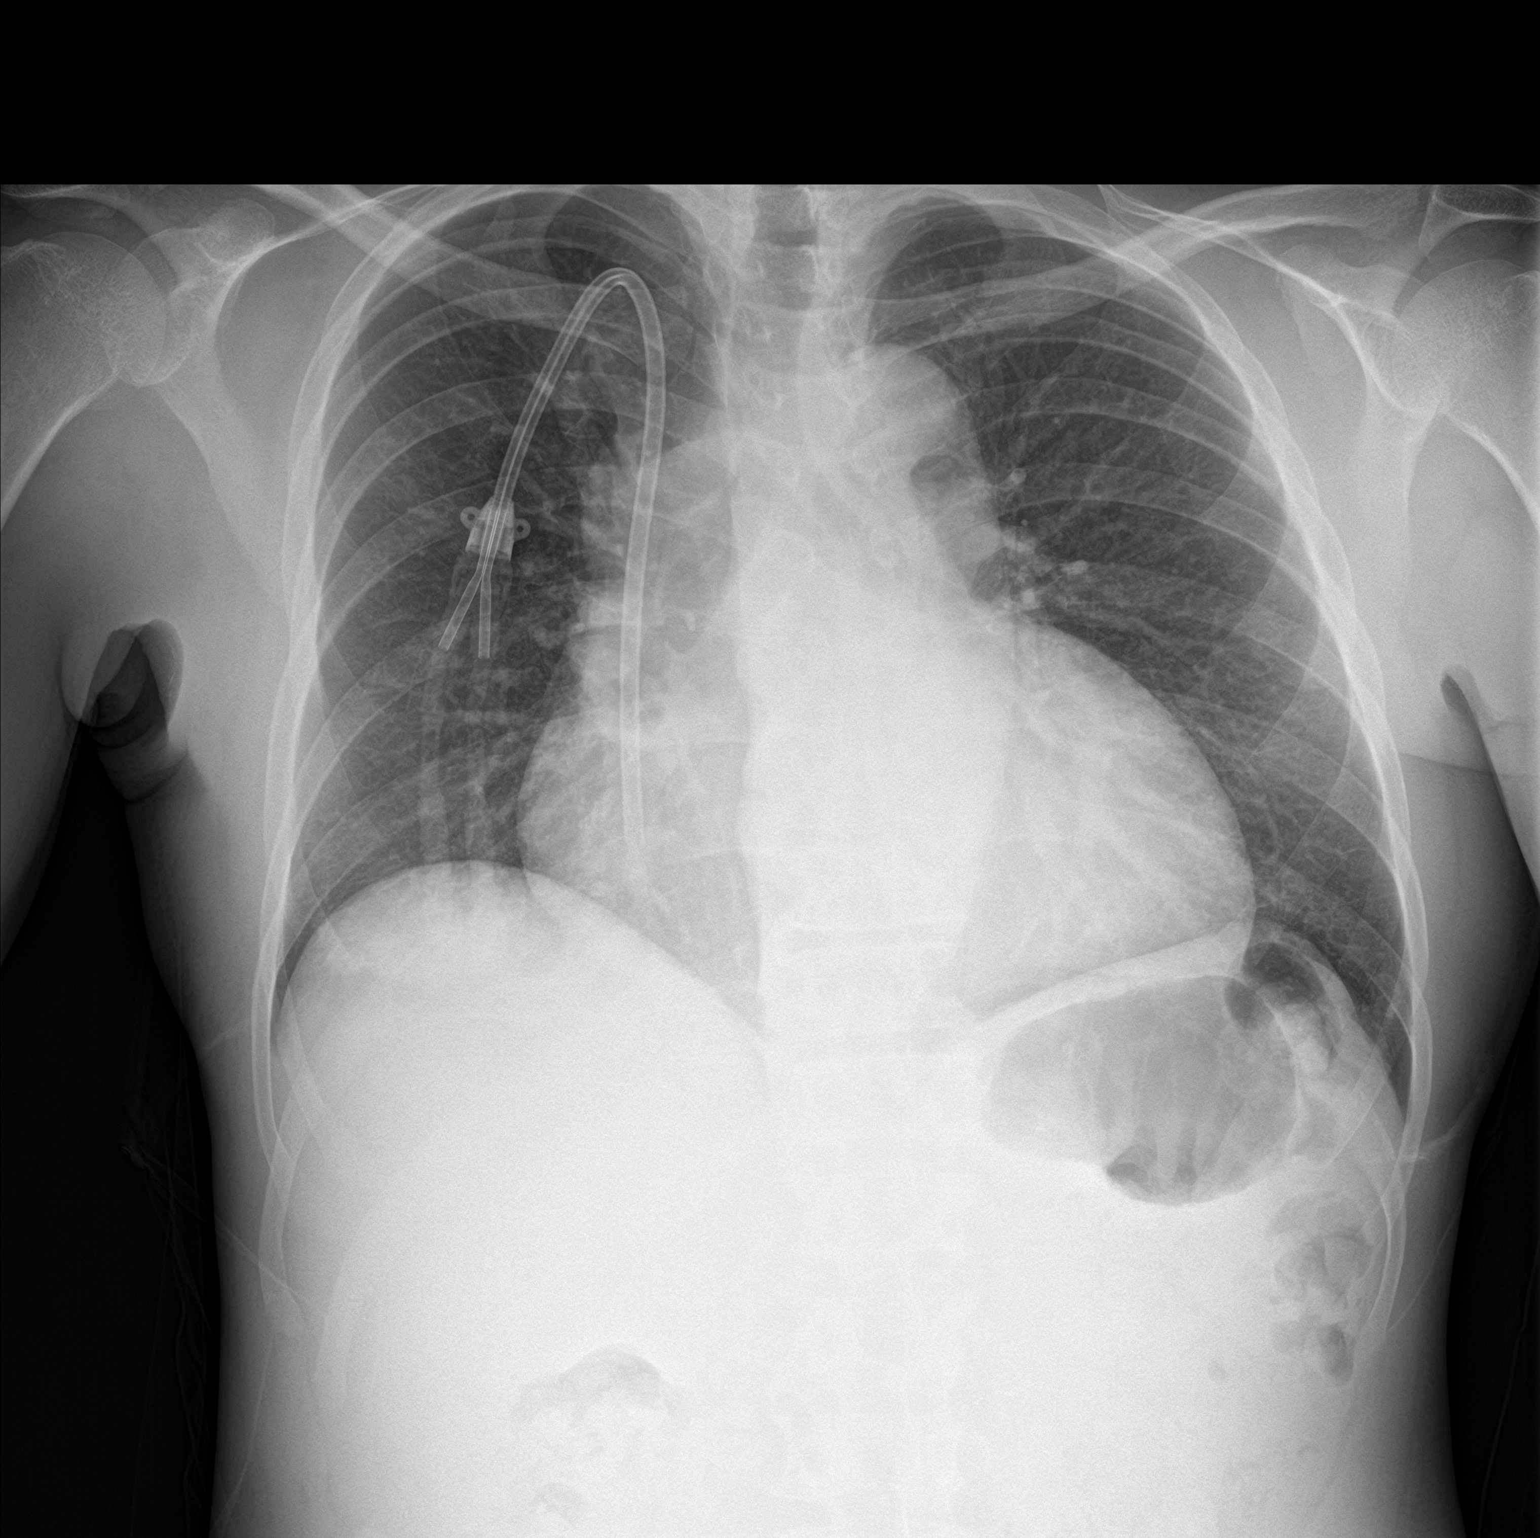

[chest lat]
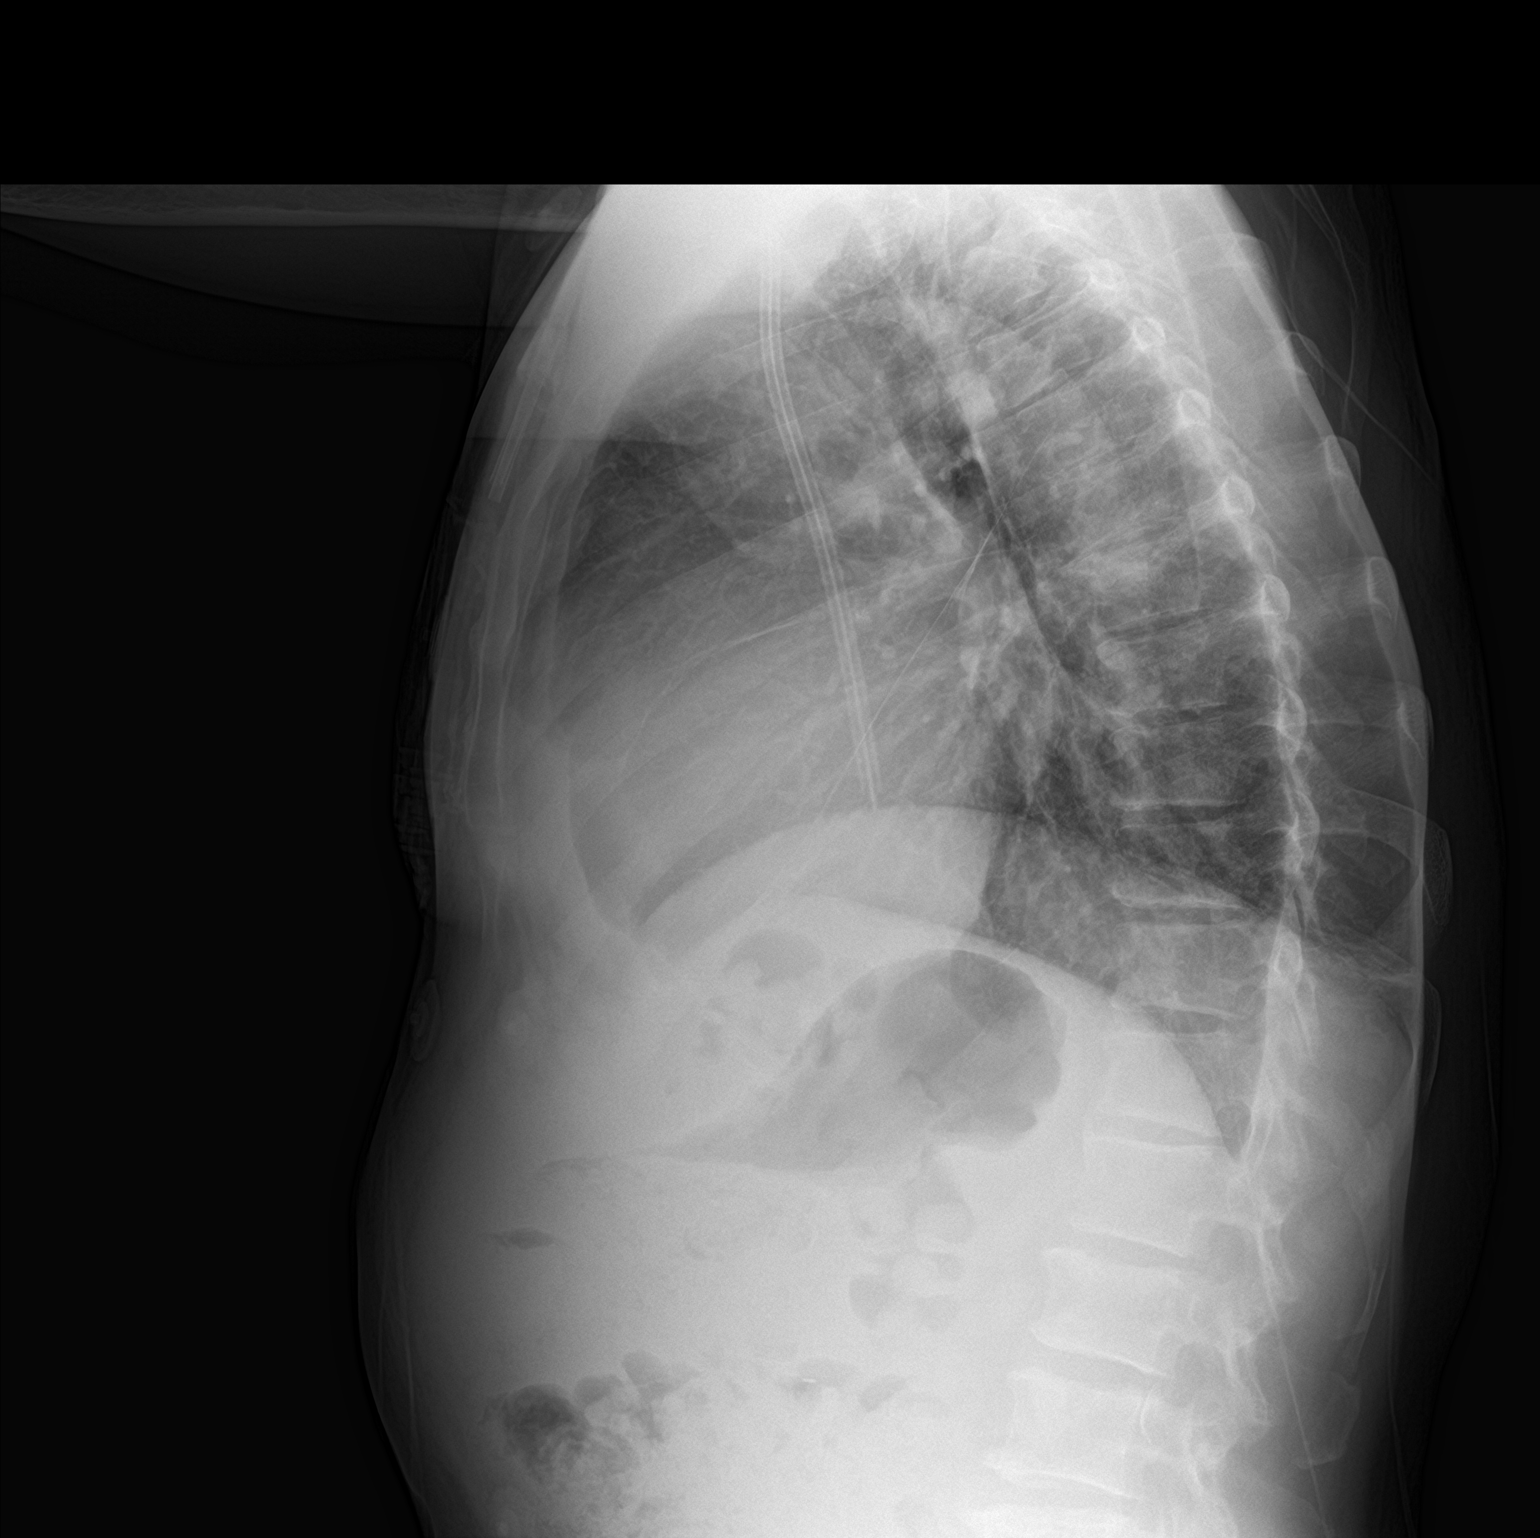

[2 of 2 positions shown; findings below may reference images not displayed]

FINDINGS: The lungs are well-aerated and clear. There is no evidence of focal
opacification, pleural effusion or pneumothorax.

The heart is mildly enlarged. A right-sided dual-lumen catheter is
noted ending at the right atrium. No acute osseous abnormalities are
seen.
IMPRESSION: Mild cardiomegaly.  Lungs remain grossly clear.

## 2019-05-26 IMAGING — DX DG CHEST 2V
2 series · 2 of 2 positions shown · non-contrast
Comparison: Four days ago

CLINICAL DATA: Shortness of breath

EXAM:
CHEST  2 VIEW

[chest pa]
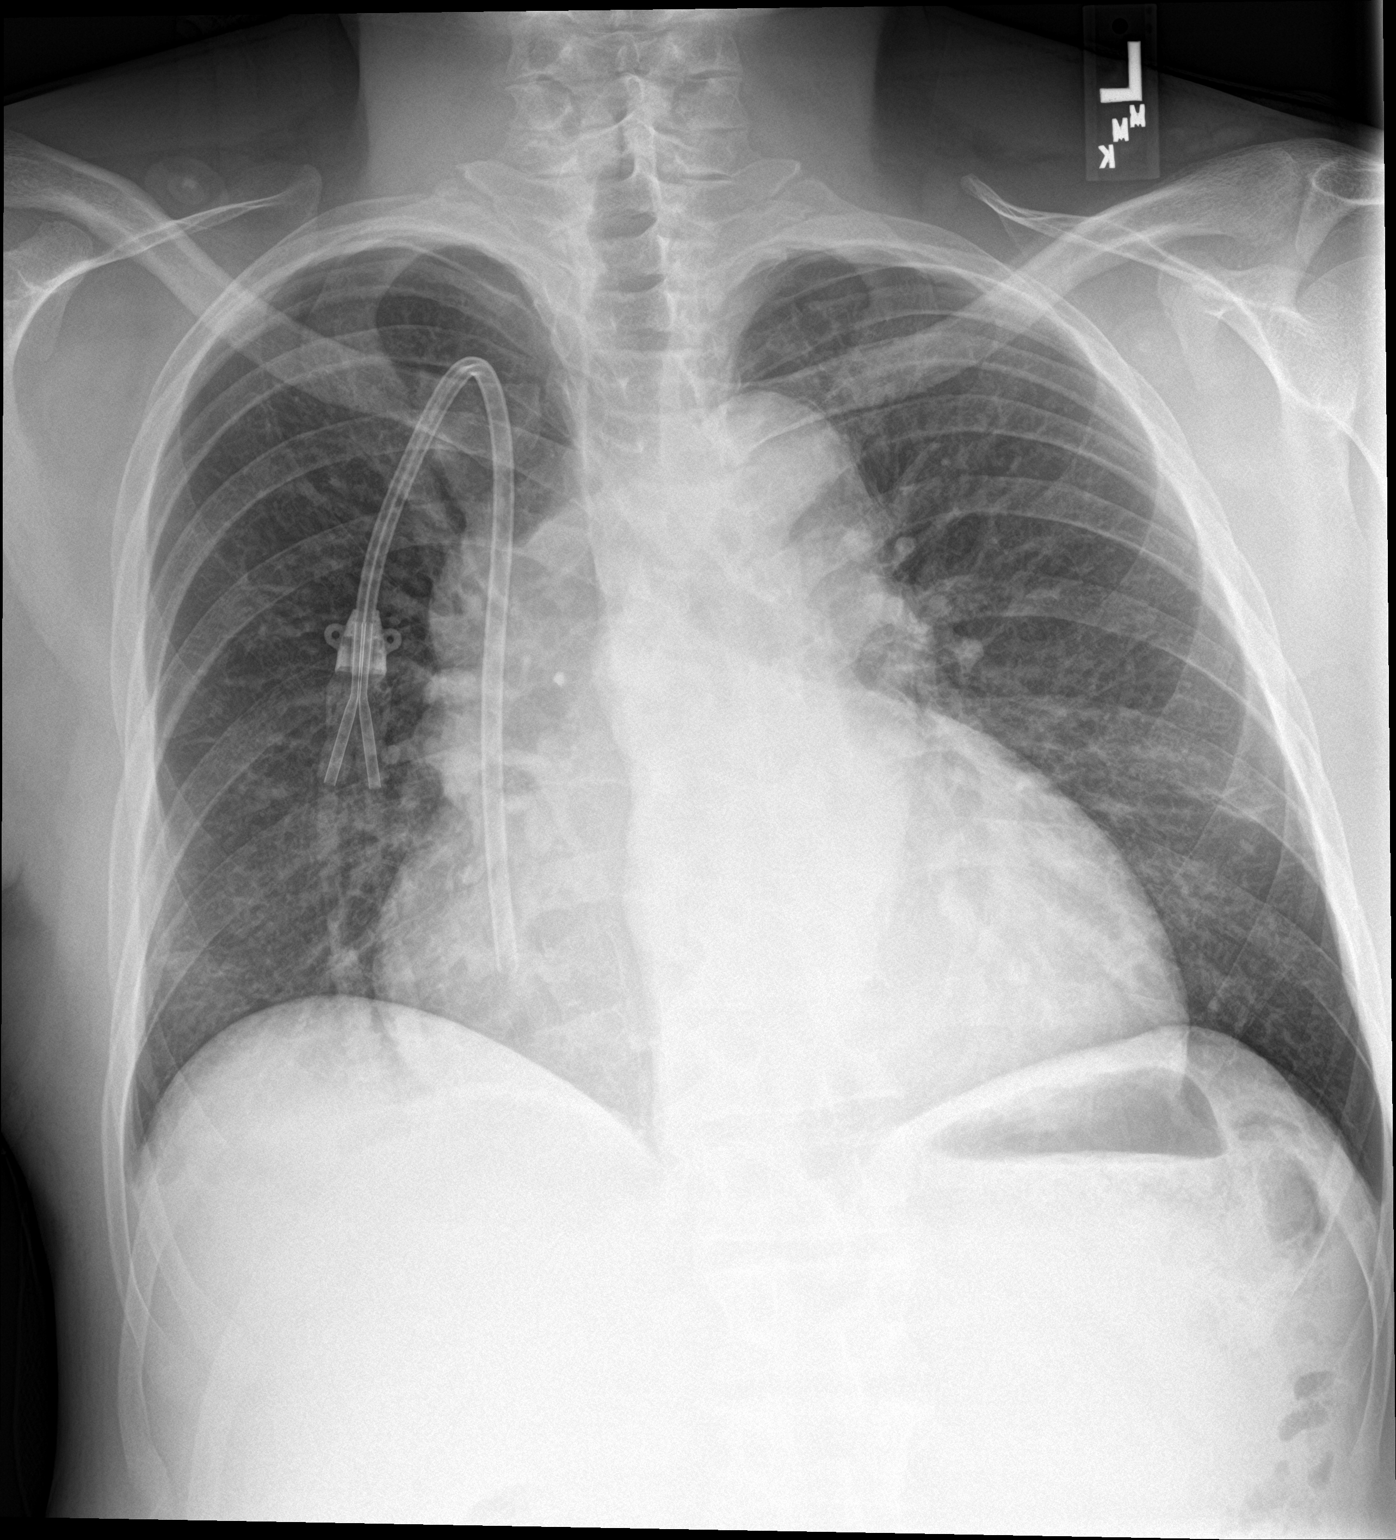

[chest lat]
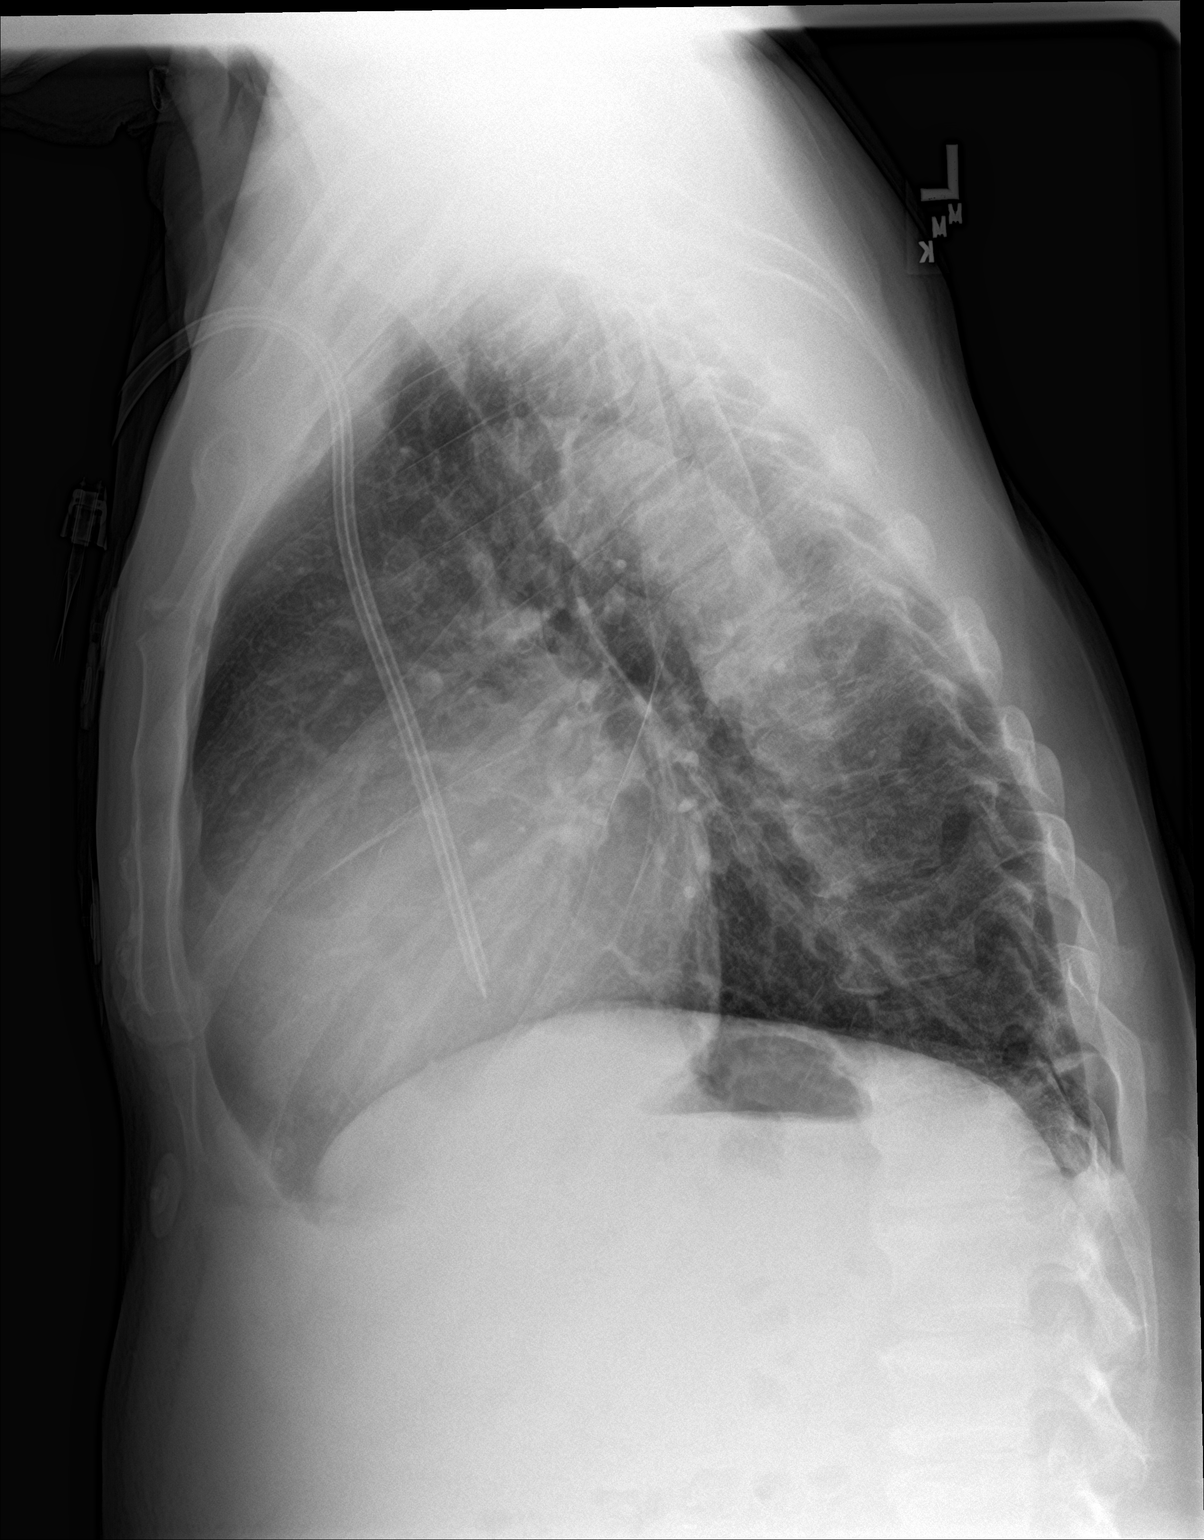

[2 of 2 positions shown; findings below may reference images not displayed]

FINDINGS: Cardiopericardial enlargement that is stable. Aortic tortuosity.
Dialysis catheter on the right with tip at the right atrium. There
is no edema, consolidation, effusion, or pneumothorax. No acute
osseous finding.
IMPRESSION: 1. No acute finding.
2. Cardiomegaly.

## 2019-06-06 ENCOUNTER — Telehealth: Payer: Self-pay

## 2019-06-06 NOTE — Telephone Encounter (Signed)
Spoke with Hinton Dyer at [facility].  She says that Mr. Reynaga has been refusing the Epogen and last received dose of [id number] on 04/03/2019.

## 2019-06-06 NOTE — Telephone Encounter (Signed)
I am ok to see him on Monday for further discussion. Thank you Would you please contact [facility] to see his schedule of most recent Epogen treatments. Thank  You

## 2019-06-06 NOTE — Telephone Encounter (Signed)
Labs drawn at [facility] yesterday with hgb result of 6.1.  Per received labs: They advised him to go to ER for blood transfusion and he told them he would go on Monday.  Patient called the office this morning requesting to see MD on Monday.  He states that he has been getting Epogen during dialysis but he would like to see Dr. Tasia Catchings for Aranesp.    I asked him since his hgb is low at 6.1 is willing/able to go to ER today for blood transfusion.  He states that he would not have time for that because he is going to [place] to get his daughter.  I advised it would be best and more safe for him to go to ER for blood transfusion BEFORE going to [place] for the weekend.  He sated again that he doesn't have time for ER visit.

## 2019-06-06 NOTE — Telephone Encounter (Signed)
LC, please schedule and let him know of appt details and I will contact [facility].

## 2019-06-09 ENCOUNTER — Inpatient Hospital Stay: Payer: Medicare Other | Attending: Oncology | Admitting: Oncology

## 2019-06-09 ENCOUNTER — Other Ambulatory Visit: Payer: Self-pay

## 2019-06-09 ENCOUNTER — Encounter: Payer: Self-pay | Admitting: Oncology

## 2019-06-09 VITALS — BP 160/105 | HR 92 | Temp 98.2°F | Resp 18 | Wt 138.3 lb

## 2019-06-09 DIAGNOSIS — R5383 Other fatigue: Secondary | ICD-10-CM | POA: Insufficient documentation

## 2019-06-09 DIAGNOSIS — N186 End stage renal disease: Secondary | ICD-10-CM

## 2019-06-09 DIAGNOSIS — K219 Gastro-esophageal reflux disease without esophagitis: Secondary | ICD-10-CM | POA: Diagnosis not present

## 2019-06-09 DIAGNOSIS — I5042 Chronic combined systolic (congestive) and diastolic (congestive) heart failure: Secondary | ICD-10-CM | POA: Insufficient documentation

## 2019-06-09 DIAGNOSIS — Z992 Dependence on renal dialysis: Secondary | ICD-10-CM | POA: Diagnosis not present

## 2019-06-09 DIAGNOSIS — Z79899 Other long term (current) drug therapy: Secondary | ICD-10-CM | POA: Insufficient documentation

## 2019-06-09 DIAGNOSIS — Z9119 Patient's noncompliance with other medical treatment and regimen: Secondary | ICD-10-CM | POA: Diagnosis not present

## 2019-06-09 DIAGNOSIS — Z7982 Long term (current) use of aspirin: Secondary | ICD-10-CM | POA: Insufficient documentation

## 2019-06-09 DIAGNOSIS — I252 Old myocardial infarction: Secondary | ICD-10-CM | POA: Diagnosis not present

## 2019-06-09 DIAGNOSIS — Z8673 Personal history of transient ischemic attack (TIA), and cerebral infarction without residual deficits: Secondary | ICD-10-CM | POA: Diagnosis not present

## 2019-06-09 DIAGNOSIS — E785 Hyperlipidemia, unspecified: Secondary | ICD-10-CM | POA: Insufficient documentation

## 2019-06-09 DIAGNOSIS — D631 Anemia in chronic kidney disease: Secondary | ICD-10-CM

## 2019-06-09 DIAGNOSIS — I132 Hypertensive heart and chronic kidney disease with heart failure and with stage 5 chronic kidney disease, or end stage renal disease: Secondary | ICD-10-CM | POA: Diagnosis not present

## 2019-06-09 NOTE — Telephone Encounter (Signed)
Patient is coming in at 2:30 today for MD visit.

## 2019-06-09 NOTE — Progress Notes (Signed)
Patient here to see Dr. Tasia Catchings per patient's request. Pt states that [name] informed him that his hemoglobin was low and they told him to see Dr.Yu. Pt states that he was suppose to start getting aranesp at [facility] but then the pandemic started and he had to be switched to another clinic, where they couldn't administer aranesp so they started him on epogen. He is getting epogen but it was "swelling him up and giving him bloodclots."

## 2019-06-09 NOTE — Progress Notes (Signed)
Hematology/Oncology Follow up note [facility] [phone] [phone]   Patient Care Team: Gwenlyn Saran, MD as PCP - General (Family Medicine) Burnell Blanks, MD as PCP - Cardiology (Cardiology)  REFERRING PROVIDER: Dr.Singh Sheilah Mins REASON FOR VISIT Follow up for treatment of anemia  HISTORY OF PRESENTING ILLNESS:  Angel [name] is a  48 y.o.  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  Patient reports that he has been getting Epogen with dialysis for about a year and a half.  Recently he has developed skin rash on buttock, also field questionable throat itching after he received Epogen injection.  Epogen was hold.  With further questioning, patient reports using new body wash during the same time period.  Patient has anemia of chronic kidney disease and was referred to [facility] for further evaluation. Patient reports feeling fatigue.  Denies any hematochezia or hematemesis, black tarry stool.  His rash improved after topical steroid cream  Patient was recently admitted from 08/20/2020 08/22/2018 due to missing almost 4 weeks of dialysis.  Patient was urgently dialyzed, patient left AGAINST MEDICAL ADVICE Patient recalls history of 7-8 blood transfusions in the past.  He also recalls having received about 7 IV iron treatment along with his dialysis in the past.   INTERVAL HISTORY Angel [name] is a 48 y.o. male who as above history reviewed by me today presents for follow-up visit for management of anemia secondary to chronic kidney disease, iron overload due to multiple blood transfusion. This is an acute visit as patient recently had blood work done at [facility].  Hemoglobin was 6.1.  Patient was advised by [name] to go to emergency room to have blood transfusions and patient declined.  He prefers to follow-up with  me today for further discussion.  Patient supposed to be on hemodialysis and gets Epogen through dialysis at [place]. He admits that recently he has been declining a protein because he failed the protein has caused blood clots in his brain. With further questioning, he reports that he has been blowing out  " dried blood" from his nose and he supposed to potential side effects from Neupogen. Denies any shortness of breath, focal weakness, leg swelling. Fatigue: reports worsening fatigue. Chronic onset, perisistent, no aggravating or improving factors, no associated symptoms.  Denies any fever, chills, nausea or vomiting. Denies any lightheadedness.  Sometimes he feels like his legs give up  Review of Systems  Constitutional: Positive for fatigue. Negative for appetite change, chills, fever and unexpected weight change.  HENT:   Negative for hearing loss and voice change.   Eyes: Negative for eye problems and icterus.  Respiratory: Negative for chest tightness, cough and shortness of breath.   Cardiovascular: Negative for chest pain and leg swelling.  Gastrointestinal: Negative for abdominal distention and abdominal pain.  Endocrine: Negative for hot flashes.  Genitourinary: Negative for difficulty urinating, dysuria and frequency.   Musculoskeletal: Negative for arthralgias.  Skin: Negative for itching and rash.  Neurological: Negative for light-headedness and numbness.  Hematological: Negative for adenopathy. Does not bruise/bleed easily.  Psychiatric/Behavioral: Negative for confusion.   MEDICAL HISTORY:  Past Medical History:  Diagnosis Date  . AAA (abdominal aortic aneurysm) ([place])   . Chronic anemia   . Chronic combined systolic and diastolic CHF (congestive heart failure) ([place])   . End stage renal disease ([place])   .  ESRD on dialysis ([place])   . FSGS (focal segmental glomerulosclerosis)   . GERD (gastroesophageal reflux disease)   . HLD (hyperlipidemia)   . Hypertension   . NSTEMI  (non-ST elevated myocardial infarction) ([place]) 07/2017   pt refused cath  . Pericardial effusion    a. small by echo 06/2017.  Marland Kitchen Renal disorder   . Seizure ([place])   . Stroke (cerebrum) [facility])     SURGICAL HISTORY: Past Surgical History:  Procedure Laterality Date  . ABDOMINAL AORTIC ANEURYSM REPAIR    . KIDNEY SURGERY      SOCIAL HISTORY: Social History   Socioeconomic History  . Marital status: Divorced    Spouse name: Not on file  . Number of children: 5  . Years of education: 16  . Highest education level: Bachelor's degree (e.g., BA, AB, BS)  Occupational History    Comment: unemployed  Social Needs  . Financial resource strain: Somewhat hard  . Food insecurity    Worry: Never true    Inability: Never true  . Transportation needs    Medical: Yes    Non-medical: Yes  Tobacco Use  . Smoking status: Never Smoker  . Smokeless tobacco: Never Used  Substance and Sexual Activity  . Alcohol use: No    Frequency: Never  . Drug use: No  . Sexual activity: Not Currently  Lifestyle  . Physical activity    Days per week: 0 days    Minutes per session: 0 min  . Stress: Not at all  Relationships  . Social connections    Talks on phone: More than three times a week    Gets together: More than three times a week    Attends religious service: More than 4 times per year    Active member of club or organization: No    Attends meetings of clubs or organizations: Never    Relationship status: Divorced  . Intimate partner violence    Fear of current or ex partner: No    Emotionally abused: No    Physically abused: No    Forced sexual activity: No  Other Topics Concern  . Not on file  Social History Narrative  . Not on file    FAMILY HISTORY: Family History  Problem Relation Age of Onset  . Hypertension Mother   . Kidney disease Mother   . Asthma Father   . Hypertension Father   . Kidney disease Father   . Hypertension Sister   . Heart disease Sister   . Kidney  disease Sister   . Heart disease Brother   . Hypertension Brother   . COPD Brother     ALLERGIES:  is allergic to clonidine derivatives; hydralazine hcl; and losartan potassium-hctz.  MEDICATIONS:  Current Outpatient Medications  Medication Sig Dispense Refill  . amLODipine (NORVASC) 10 MG tablet Take 10 mg by mouth daily.    Marland Kitchen aspirin EC 81 MG tablet Take 81 mg daily by mouth.    Marland Kitchen atorvastatin (LIPITOR) 40 MG tablet Take 40 mg at bedtime by mouth.    . calcitRIOL (ROCALTROL) 0.5 MCG capsule Take 0.5 mcg by mouth daily.     . carvedilol (COREG) 25 MG tablet Take 25 mg 2 (two) times daily with a meal by mouth.    . cholecalciferol (VITAMIN D) 1000 units tablet Take 1,000 Units by mouth 2 (two) times daily.    . cloNIDine (CATAPRES) 0.2 MG tablet Take 0.2 mg by mouth 2 (two) times daily.  23  .  furosemide (LASIX) 80 MG tablet Take 80 mg at bedtime by mouth.    . hydrALAZINE (APRESOLINE) 100 MG tablet Take 100 mg by mouth 3 (three) times daily.    . hydrOXYzine (ATARAX/VISTARIL) 10 MG tablet Take 10 mg by mouth daily as needed for itching or anxiety.     . irbesartan (AVAPRO) 150 MG tablet Take 150 mg by mouth daily.    . Lactobacillus Rhamnosus, GG, (CULTURELLE) CAPS Take 1 capsule by mouth daily.    . multivitamin (RENA-VIT) TABS tablet Take 1 tablet by mouth 2 (two) times daily.    Marland Kitchen OLANZapine (ZYPREXA) 10 MG tablet Take 10 mg by mouth daily.    . Omega-3 Fatty Acids (OMEGA III EPA+DHA) 1000 MG CAPS Take 1,000 mg by mouth daily.     . pantoprazole (PROTONIX) 40 MG tablet Take 40 mg by mouth daily.     . sevelamer carbonate (RENVELA) 800 MG tablet Take 1,600 mg by mouth See admin instructions. Take 1,600 mg by mouth three times a day with meals and 1,600 mg with each snack    . tamsulosin (FLOMAX) 0.4 MG CAPS capsule TAKE 1 CAPSULE BY MOUTH EVERYDAY AT BEDTIME    . WHITE PETROLATUM-MINERAL OIL OP Apply 1 application topically 2 (two) times daily as needed (itching).      No current  facility-administered medications for this visit.      PHYSICAL EXAMINATION: ECOG PERFORMANCE STATUS: 1 - Symptomatic but completely ambulatory Vitals:   06/09/19 1438  BP: (!) 160/105  Pulse: 92  Resp: 18  Temp: 98.2 F (36.8 C)   Filed Weights   06/09/19 1438  Weight: 138 lb 4.8 oz (62.7 kg)    Physical Exam Constitutional:      General: He is not in acute distress.    Appearance: He is well-developed. He is ill-appearing.  HENT:     Head: Normocephalic and atraumatic.     Right Ear: External ear normal.     Left Ear: External ear normal.  Eyes:     General: No scleral icterus.    Pupils: Pupils are equal, round, and reactive to light.  Neck:     Musculoskeletal: Normal range of motion and neck supple.  Cardiovascular:     Rate and Rhythm: Normal rate and regular rhythm.     Heart sounds: Normal heart sounds.  Pulmonary:     Effort: Pulmonary effort is normal. No respiratory distress.     Breath sounds: Normal breath sounds. No wheezing or rales.  Chest:     Chest wall: No tenderness.  Abdominal:     General: Bowel sounds are normal. There is no distension.     Palpations: Abdomen is soft. There is no mass.     Tenderness: There is no abdominal tenderness. There is no guarding.  Musculoskeletal: Normal range of motion.        General: No deformity.  Lymphadenopathy:     Cervical: No cervical adenopathy.  Skin:    General: Skin is warm and dry.     Coloration: Skin is pale.     Findings: No erythema or rash.  Neurological:     Mental Status: He is alert and oriented to person, place, and time.     Cranial Nerves: No cranial nerve deficit.     Coordination: Coordination normal.     Deep Tendon Reflexes: Reflexes normal.  Psychiatric:        Behavior: Behavior normal.        Thought Content:  Thought content normal.      LABORATORY DATA:  I have reviewed the data as listed Lab Results  Component Value Date   WBC 7.9 08/21/2018   HGB 6.6 (LL)  08/21/2018   HCT 19.8 (L) 08/21/2018   MCV 83.5 08/21/2018   PLT 127 (L) 08/21/2018   Recent Labs    08/17/18 [id number]  08/20/18 1703 08/20/18 2117 08/20/18 2259 08/21/18 0429 08/21/18 0639  NA 141   < > 137 138 141 140  --   K >7.5*   < > 7.4* 4.0 4.6 4.9 5.1  CL 94*   < > 93* 98 95* 97*  --   CO2 15*   < > 15*  --  21* 23  --   GLUCOSE 99   < > 231* 85 109* 127*  --   BUN 171*   < > 191* 102* 114* 118*  --   CREATININE 38.66*   < > 43.96* >18.00* 27.49* 29.78*  --   CALCIUM 9.7   < > 8.3*  --  9.2 8.7*  --   GFRNONAA 1*   < > 1*  --  2* 1*  --   GFRAA 1*   < > 1*  --  2* 2*  --   PROT 7.7  --   --   --   --   --   --   ALBUMIN 3.9  --   --   --  3.7  --   --   AST 26  --   --   --   --   --   --   ALT 24  --   --   --   --   --   --   ALKPHOS 101  --   --   --   --   --   --   BILITOT 0.8  --   --   --   --   --   --    < > = values in this interval not displayed.   Labs from [place] reviewed by me. 11/23/17.  Hemoglobin 8.5, vitamin B12 1199, folate 12.8, ferritin 1878.  Iron 59, TIBC 240.  WBC 6, MCV 88.6, platelet counts 2 41,000.  TSH 2.97 01/03/18   Hemoglobin 8.9, iron saturation 73, iron 160, TIBC 219, wbc 5.5, platelet 232,000,  01/07/2018 Hemoglobin 9.1 ferritin 1924, Iron saturation 38, TIBC 221 01/14/18  Hemoglobin 8.8, Hct 26.4. 03/06/2018 hemoglobin 7.1, HCT 22.4. 03/20/2018 hemoglobin 7.1 03/26/2018, ferritin 1690, saturation ratio 14.   08/21/2018 showed hemoglobin 6.5, hematocrit 18.7, platelet count 127. 08/17/2018, normal AST and ALT.  Normal albumin. 08/20/2018, ferritin 2105, iron saturation 63.  TIBC 183. 01/16/19 20 hemoglobin 7.8 Iron saturation 23, iron 44, TIBC 192.    ASSESSMENT & PLAN:  1. ESRD (end stage renal disease) on dialysis ([place])   2. Anemia due to end stage renal disease ([place])   3. Iron overload due to repeated red blood cell transfusions   4. Anemia due to chronic kidney disease, on chronic dialysis ([place])   #Anemia, due to chronic kidney  failure. Patient has been noncompliant and declines Epogen via dialysis at [facility]. We had a lengthy discussion and I explained to him that although Epogen as side effects of increasing thrombosis risk, benefit overweighs small thrombosis risk in his case.  Discussed about the severe consequence of being profound anemia. Blowing out dried blood from nose is not a sign of developing brain clots.  Reassurance was given. Patient agrees to return to [name] tomorrow and ask for keep watching to be given. He appears not overtly symptomatic with a hemoglobin of 6.1 and declines blood transfusion. I think if he can become compliant with his dialysis/he protein, anticipate hemoglobin to improve.  Iron overload due to repeated blood cell transfusion.  Discussed with patient about importance of compliance.  His iron overload will eventually gradually improve. 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 Repeat iron panel at the next visit in 4 weeks.    The patient knows to call the clinic with any problems questions or concerns.  Return of visit 4 weeks  Earlie Server, MD, PhD 06/09/2019

## 2019-06-10 ENCOUNTER — Ambulatory Visit: Payer: Medicare Other | Admitting: Oncology

## 2019-06-13 ENCOUNTER — Telehealth: Payer: Self-pay

## 2019-06-13 NOTE — Telephone Encounter (Signed)
Patient called cancer center stating he needed a blood transfusion. I called patient back and he said that he was seen by Dr. Tasia Catchings on Monday and she told him to continue taking Epogen. He went to [name] to get Epogen and was told that his hemoglobin was critical he needed a blood tranfusion. I advised him to go to ER to get transfusion since it cannot be done here. He said he will go Monday because he is not having any symptoms. Again, I reinforced the importance of going to ER, but he still said he would go Monday. Called [name] and spoke to [place] who  verified that patient's hemoglobin was a critical 5.8.

## 2019-06-14 IMAGING — DX DG CHEST 1V PORT
1 series · 1 of 1 positions shown · non-contrast
Comparison: 06/24/2017

CLINICAL DATA: Bilateral breast and lower extremity swelling.

EXAM:
PORTABLE CHEST 1 VIEW

[chest ap]
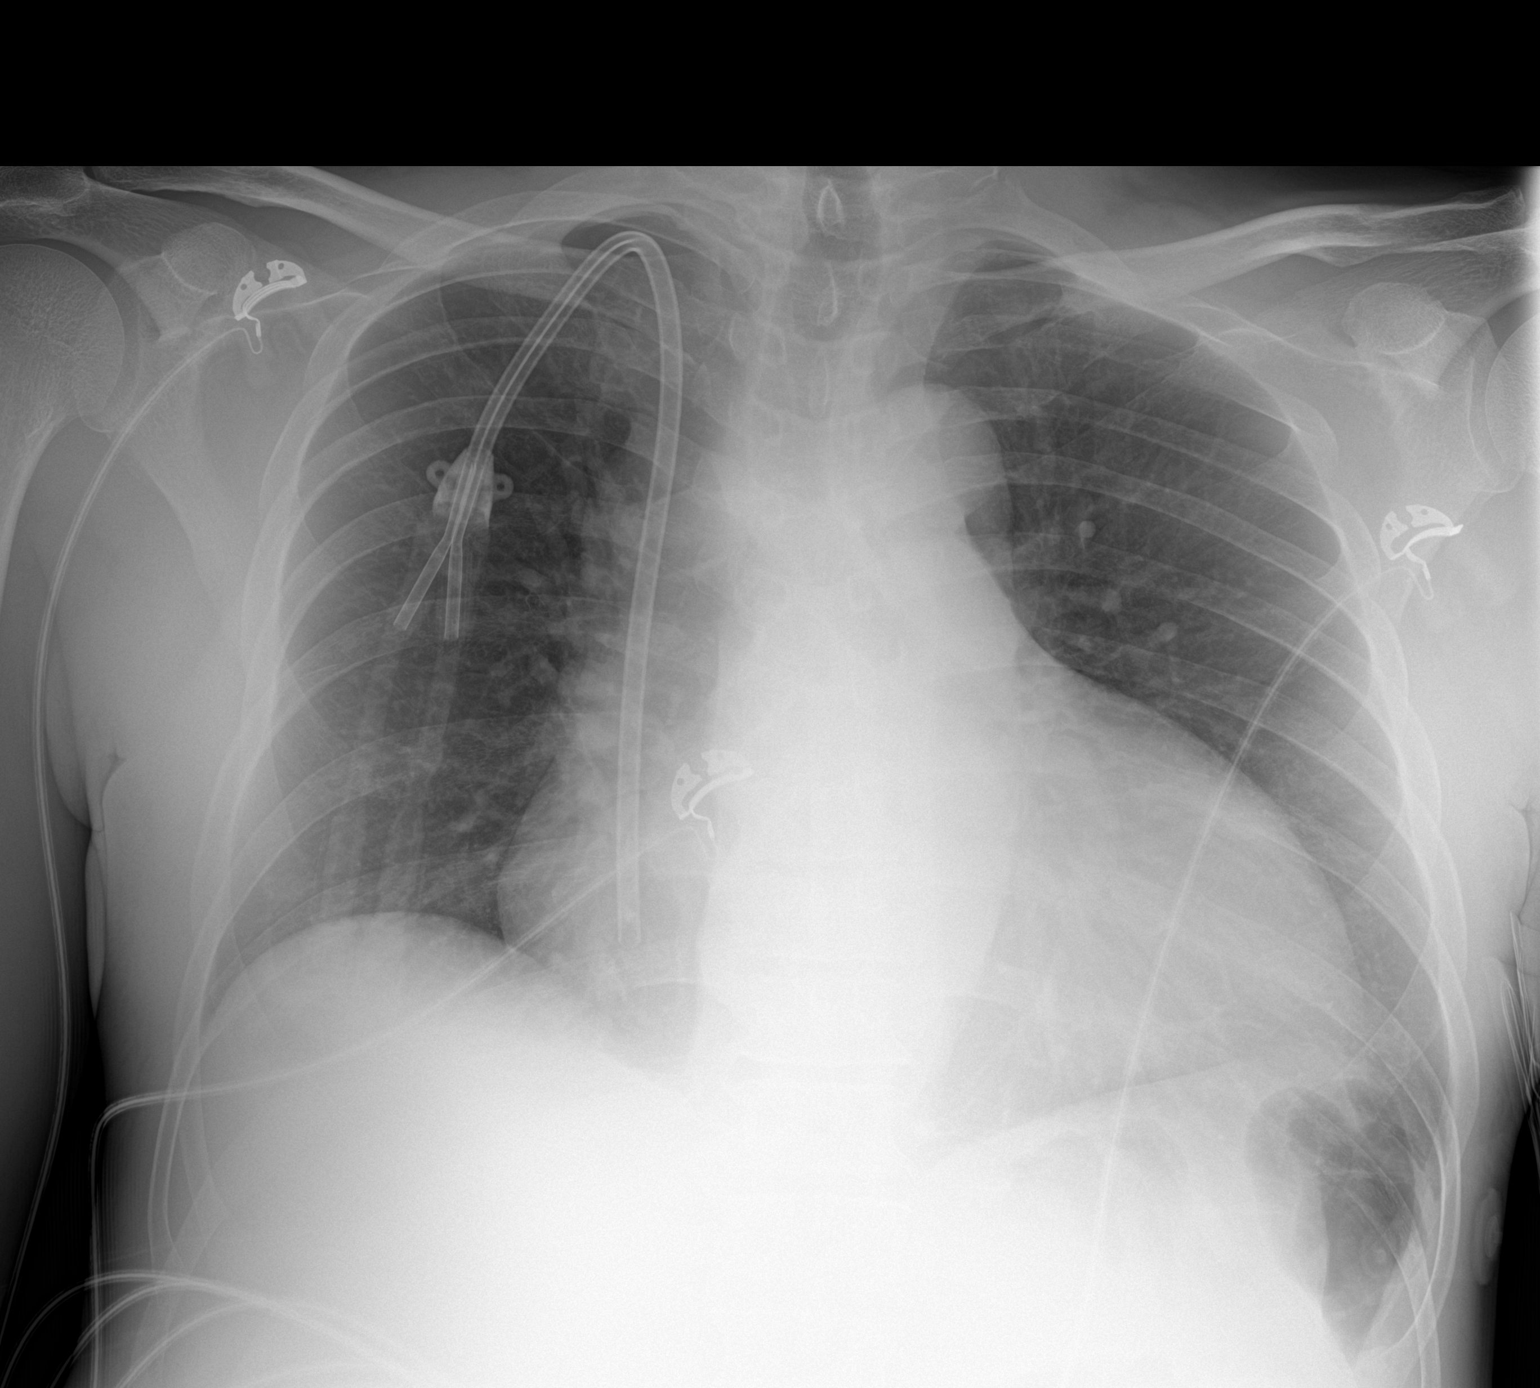

[1 of 1 positions shown; findings below may reference images not displayed]

FINDINGS: Dialysis catheter terminates within the expected location of right
atrium.

Cardiac silhouette is markedly enlarged.  Tortuosity of the aorta.

There is no evidence of focal airspace consolidation, pleural
effusion or pneumothorax.

Osseous structures are without acute abnormality. Soft tissues are
grossly normal.
IMPRESSION: Markedly enlarged cardiac silhouette.

No evidence of pulmonary edema or focal consolidation.

## 2019-06-18 ENCOUNTER — Emergency Department (HOSPITAL_COMMUNITY)
Admission: EM | Admit: 2019-06-18 | Discharge: 2019-06-18 | Disposition: A | Discharge status: Home / Self Care | Payer: Medicare Other | Attending: Emergency Medicine | Admitting: Emergency Medicine

## 2019-06-18 DIAGNOSIS — I5042 Chronic combined systolic (congestive) and diastolic (congestive) heart failure: Secondary | ICD-10-CM | POA: Insufficient documentation

## 2019-06-18 DIAGNOSIS — D649 Anemia, unspecified: Secondary | ICD-10-CM | POA: Diagnosis not present

## 2019-06-18 DIAGNOSIS — Z79899 Other long term (current) drug therapy: Secondary | ICD-10-CM | POA: Diagnosis not present

## 2019-06-18 DIAGNOSIS — N186 End stage renal disease: Secondary | ICD-10-CM | POA: Insufficient documentation

## 2019-06-18 DIAGNOSIS — Z992 Dependence on renal dialysis: Secondary | ICD-10-CM | POA: Insufficient documentation

## 2019-06-18 DIAGNOSIS — I252 Old myocardial infarction: Secondary | ICD-10-CM | POA: Diagnosis not present

## 2019-06-18 DIAGNOSIS — I132 Hypertensive heart and chronic kidney disease with heart failure and with stage 5 chronic kidney disease, or end stage renal disease: Secondary | ICD-10-CM | POA: Diagnosis not present

## 2019-06-18 LAB — COMPREHENSIVE METABOLIC PANEL
ALT: 78 U/L — ABNORMAL HIGH (ref 0–44)
AST: 67 U/L — ABNORMAL HIGH (ref 15–41)
Albumin: 3.7 g/dL (ref 3.5–5.0)
Alkaline Phosphatase: 166 U/L — ABNORMAL HIGH (ref 38–126)
Anion gap: 22 — ABNORMAL HIGH (ref 5–15)
BUN: 111 mg/dL — ABNORMAL HIGH (ref 6–20)
CO2: 18 mmol/L — ABNORMAL LOW (ref 22–32)
Calcium: 9.2 mg/dL (ref 8.9–10.3)
Chloride: 101 mmol/L (ref 98–111)
Creatinine, Ser: 20.72 mg/dL — ABNORMAL HIGH (ref 0.61–1.24)
GFR calc Af Amer: 3 mL/min — ABNORMAL LOW (ref >60)
GFR calc non Af Amer: 2 mL/min — ABNORMAL LOW (ref >60)
Glucose, Bld: 96 mg/dL (ref 70–99)
Potassium: 4.7 mmol/L (ref 3.5–5.1)
Sodium: 141 mmol/L (ref 135–145)
Total Bilirubin: 1.2 mg/dL (ref 0.3–1.2)
Total Protein: 6.8 g/dL (ref 6.5–8.1)

## 2019-06-18 LAB — CBC WITH DIFFERENTIAL/PLATELET
Abs Immature Granulocytes: 0.02 10*3/uL (ref 0.00–0.07)
Basophils Absolute: 0 10*3/uL (ref 0.0–0.1)
Basophils Relative: 0 %
Eosinophils Absolute: 0.1 10*3/uL (ref 0.0–0.5)
Eosinophils Relative: 2 %
HCT: 19.8 % — ABNORMAL LOW (ref 39.0–52.0)
Hemoglobin: 6.8 g/dL — CL (ref 13.0–17.0)
Immature Granulocytes: 0 %
Lymphocytes Relative: 9 %
Lymphs Abs: 0.6 10*3/uL — ABNORMAL LOW (ref 0.7–4.0)
MCH: 29.8 pg (ref 26.0–34.0)
MCHC: 34.3 g/dL (ref 30.0–36.0)
MCV: 86.8 fL (ref 80.0–100.0)
Monocytes Absolute: 0.5 10*3/uL (ref 0.1–1.0)
Monocytes Relative: 7 %
Neutro Abs: 5.5 10*3/uL (ref 1.7–7.7)
Neutrophils Relative %: 82 %
Platelets: 272 10*3/uL (ref 150–400)
RBC: 2.28 MIL/uL — ABNORMAL LOW (ref 4.22–5.81)
RDW: 18.8 % — ABNORMAL HIGH (ref 11.5–15.5)
WBC: 6.7 10*3/uL (ref 4.0–10.5)
nRBC: 0 % (ref 0.0–0.2)

## 2019-06-18 LAB — TYPE AND SCREEN
ABO/RH(D): A POS
Antibody Screen: NEGATIVE

## 2019-06-18 NOTE — ED Notes (Signed)
ESRD, had epogen shot Thursday- had dialysis Thursday-- did not have it Saturday, because center called and told that his Hgb 5.8--- told to come here. Supposed to have dialysis tonight (goes to center on third shift)

## 2019-06-18 NOTE — ED Triage Notes (Signed)
Pt reports recent epogen shot last Thursday. Here to get his blood counts checked make sure he doesn't need a blood transfusion. Pt denies complaints, dizziness, CP, fatigue. VSS.

## 2019-06-18 NOTE — ED Provider Notes (Signed)
Patient placed in Quick Look pathway, seen and evaluated   Chief Complaint: Anemia  HPI:  Went to Dialysis on Saturday 06/14/2019; informed of hemoglobin of 5.8. Had EPO 3-4 days ago. Asymptomatic; informed by RN at dialysis center of need for transfusion.  ROS: Denies fv or chills, headache, lightheadedness, CP, SOB, palpataition, melena, blood in stood or any other concerns.  Physical Exam:   Gen: No distress  Neuro: Awake and Alert  Skin: Warm    Focused Exam: HRR, LCTA, Abd soft nontender  Plan: Labs ordered will need room.  Initiation of care has begun. The patient has been counseled on the process, plan, and necessity for staying for the completion/evaluation, and the remainder of the medical screening examination    Gari Crown 06/18/19 [place], Ankit, MD 06/18/19 [phone]

## 2019-06-18 NOTE — ED Notes (Signed)
Pt would like to go AMA if not getting a transfusion.

## 2019-06-18 NOTE — ED Provider Notes (Addendum)
[facility] EMERGENCY DEPARTMENT Provider Note   CSN: [id number] Arrival date & time: 06/18/19  [id number]     History   Chief Complaint Chief Complaint  Patient presents with  . Abnormal lab    HPI Angel [name] is a 48 y.o. male.     HPI  48 year old male comes in a chief complaint of abnormal lab. He has history of AAA, chronic anemia, chronic CHF, ESRD on hemodialysis.  Patient reports that his dialysis center sent him to the ER because his hemoglobin was too low.  Allegedly his hemoglobin was 5.8.  He reports that he is seeing heme-onc at [facility].  They have recommended that he get EPO before they would transfuse.  Reading the notes from [facility] it seems like patient has iron overload and that they are concerned about patient's compliance with people.  Patient is informing me now that he is getting EPO at his dialysis.  With the hemoglobin he does not have any dizziness, weakness, chest pain, shortness of breath.  Patient reports that he would get transfusion only if his hematology team recommended.  Past Medical History:  Diagnosis Date  . AAA (abdominal aortic aneurysm) ([place])   . Chronic anemia   . Chronic combined systolic and diastolic CHF (congestive heart failure) ([place])   . End stage renal disease ([place])   . ESRD on dialysis ([place])   . FSGS (focal segmental glomerulosclerosis)   . GERD (gastroesophageal reflux disease)   . HLD (hyperlipidemia)   . Hypertension   . NSTEMI (non-ST elevated myocardial infarction) ([place]) 07/2017   pt refused cath  . Pericardial effusion    a. small by echo 06/2017.  Marland Kitchen Renal disorder   . Seizure ([place])   . Stroke (cerebrum) [facility])     Patient Active Problem List   Diagnosis Date Noted  . Acute hypercapnic respiratory failure ([place]) 08/20/2018  . Anemia 08/17/2018  . Acute renal failure (ARF) ([place]) 04/03/2018  . Chronic combined systolic and diastolic CHF (congestive heart failure) ([place]) 08/14/2017  .  Thrombocytopenia ([place]) 07/30/2017  . Dialysis patient, noncompliant ([place])   . HTN (hypertension) 07/13/2017  . Non-ST elevation (NSTEMI) myocardial infarction ([place])   . Elevated troponin   . Hyperkalemia 06/20/2017  . GERD (gastroesophageal reflux disease) 06/20/2017  . Seizure disorder ([place]) 06/20/2017  . Abnormal LFTs 06/20/2017  . Anemia in ESRD (end-stage renal disease) ([place]) 06/20/2017  . HLD (hyperlipidemia)   . Stroke (cerebrum) ([place])   . ESRD (end stage renal disease) on dialysis ([place])   . Acute on chronic diastolic CHF (congestive heart failure) ([place])   . Acute post-operative pain 02/14/2017  . Acute respiratory insufficiency 02/14/2017  . Febrile nonhemolytic transfusion reaction 02/13/2017  . FSGS (focal segmental glomerulosclerosis) 02/06/2017  . History of stroke 09/13/2016  . Palpitations 09/13/2016  . Abdominal aortic aneurysm (AAA) without rupture ([place]) 07/21/2016  . 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 Pre-transplant evaluation for kidney transplant 03/20/2016    Past Surgical History:  Procedure Laterality Date  . ABDOMINAL AORTIC ANEURYSM REPAIR    . KIDNEY SURGERY          Home Medications    Prior to Admission medications   Medication Sig Start Date End Date Taking? Authorizing Provider  amLODipine (NORVASC) 10 MG tablet Take 10 mg by mouth daily.    [provider]  aspirin EC 81 MG tablet Take 81 mg daily by mouth.    [provider]  atorvastatin (LIPITOR)  40 MG tablet Take 40 mg at bedtime by mouth.    [provider]  calcitRIOL (ROCALTROL) 0.5 MCG capsule Take 0.5 mcg by mouth daily.     [provider]  carvedilol (COREG) 25 MG tablet Take 25 mg 2 (two) times daily with a meal by mouth.    [provider]  cholecalciferol (VITAMIN D) 1000 units tablet Take 1,000 Units by mouth 2 (two) times daily.    [provider]  cloNIDine (CATAPRES) 0.2 MG tablet Take 0.2 mg by mouth 2 (two)  times daily. 10/19/17   [provider]  furosemide (LASIX) 80 MG tablet Take 80 mg at bedtime by mouth.    [provider]  hydrALAZINE (APRESOLINE) 100 MG tablet Take 100 mg by mouth 3 (three) times daily.    [provider]  hydrOXYzine (ATARAX/VISTARIL) 10 MG tablet Take 10 mg by mouth daily as needed for itching or anxiety.     [provider]  irbesartan (AVAPRO) 150 MG tablet Take 150 mg by mouth daily.    [provider]  Lactobacillus Rhamnosus, GG, (CULTURELLE) CAPS Take 1 capsule by mouth daily.    [provider]  multivitamin (RENA-VIT) TABS tablet Take 1 tablet by mouth 2 (two) times daily.    [provider]  OLANZapine (ZYPREXA) 10 MG tablet Take 10 mg by mouth daily. 01/02/19   [provider]  Omega-3 Fatty Acids (OMEGA III EPA+DHA) 1000 MG CAPS Take 1,000 mg by mouth daily.     [provider]  pantoprazole (PROTONIX) 40 MG tablet Take 40 mg by mouth daily.  07/03/16   [provider]  sevelamer carbonate (RENVELA) 800 MG tablet Take 1,600 mg by mouth See admin instructions. Take 1,600 mg by mouth three times a day with meals and 1,600 mg with each snack    [provider]  tamsulosin (FLOMAX) 0.4 MG CAPS capsule TAKE 1 CAPSULE BY MOUTH EVERYDAY AT BEDTIME 12/09/18   [provider]  WHITE PETROLATUM-MINERAL OIL OP Apply 1 application topically 2 (two) times daily as needed (itching).  03/12/17   [provider]    Family History Family History  Problem Relation Age of Onset  . Hypertension Mother   . Kidney disease Mother   . Asthma Father   . Hypertension Father   . Kidney disease Father   . Hypertension Sister   . Heart disease Sister   . Kidney disease Sister   . Heart disease Brother   . Hypertension Brother   . COPD Brother     Social History Social History   Tobacco Use  . Smoking status: Never Smoker  . Smokeless tobacco: Never Used  Substance  Use Topics  . Alcohol use: No    Frequency: Never  . Drug use: No     Allergies   Clonidine derivatives, Hydralazine hcl, and Losartan potassium-hctz   Review of Systems Review of Systems  Constitutional: Negative for activity change.  Respiratory: Negative for shortness of breath.   Cardiovascular: Negative for chest pain.  Neurological: Negative for dizziness.  Hematological: Does not bruise/bleed easily.  All other systems reviewed and are negative.    Physical Exam Updated Vital Signs BP (!) 187/128 (BP Location: Right Arm)   Pulse 89   Temp 98.2 F (36.8 C) (Oral)   Resp 20   Ht 5\' 8"  (1.727 m)   Wt 62.6 kg   SpO2 100%   BMI 20.98 kg/m   Physical Exam Vitals  signs and nursing note reviewed.  Constitutional:      Appearance: He is well-developed.  HENT:     Head: Atraumatic.  Neck:     Musculoskeletal: Neck supple.  Cardiovascular:     Rate and Rhythm: Normal rate.  Pulmonary:     Effort: Pulmonary effort is normal.  Skin:    General: Skin is warm.  Neurological:     Mental Status: He is alert and oriented to person, place, and time.      ED Treatments / Results  Labs (all labs ordered are listed, but only abnormal results are displayed) Labs Reviewed  CBC WITH DIFFERENTIAL/PLATELET - Abnormal; Notable for the following components:      Result Value   RBC 2.28 (*)    Hemoglobin 6.8 (*)    HCT 19.8 (*)    RDW 18.8 (*)    Lymphs Abs 0.6 (*)    All other components within normal limits  COMPREHENSIVE METABOLIC PANEL - Abnormal; Notable for the following components:   CO2 18 (*)    BUN 111 (*)    Creatinine, Ser 20.72 (*)    AST 67 (*)    ALT 78 (*)    Alkaline Phosphatase 166 (*)    GFR calc non Af Amer 2 (*)    GFR calc Af Amer 3 (*)    Anion gap 22 (*)    All other components within normal limits  TYPE AND SCREEN    EKG None  Radiology No results found.  Procedures .Critical Care Performed by: Varney Biles, MD Authorized  by: Varney Biles, MD   Critical care provider statement:    Critical care time (minutes):  32   Critical care was necessary to treat or prevent imminent or life-threatening deterioration of the following conditions: Severe anemia.   Critical care was time spent personally by me on the following activities:  Discussions with consultants, evaluation of patient's response to treatment, examination of patient, ordering and performing treatments and interventions, ordering and review of laboratory studies, ordering and review of radiographic studies, pulse oximetry, re-evaluation of patient's condition, obtaining history from patient or surrogate and review of old charts   (including critical care time)  Medications Ordered in ED Medications - No data to display   Initial Impression / Assessment and Plan / ED Course  I have reviewed the triage vital signs and the nursing notes.  Pertinent labs & imaging results that were available during my care of the patient were reviewed by me and considered in my medical decision making (see chart for details).  Clinical Course as of Jun 17 1441  Wed Jun 18, 2019  1442 Results of the ED work-up and the recommendations by oncology discussed with the patient.  He wants to go home and does not want transfusion.  He has been advised to follow up with oncology by calling them if he does not hear from them by tomorrow afternoon. ER return precautions also discussed in case he starts developing symptoms for emergent transfusion.   [AN]    Clinical Course User Index [AN] Varney Biles, MD      48 year old comes with a chief complaint of severe anemia.  Allegedly is hemoglobin is 5.8 at the dialysis unit.  He has a complex history with multiple medical comorbidities and also chronic anemia.  It is also noted that he has noncompliance with Epogen and that he has required blood transfusions in the past.  Patient states that his last transfusion was  now more  than 3 months ago and that he is not having any symptoms because of his anemia.  I reviewed the recent hematology notes.  It appears that the hematology team is indeed concerned about his severe anemia and have discussed with him the risk of severe anemia.  They also note that he has iron overload because of recurrent transfusion.  It seems like patient is now getting compliant with Epogen -he is supposed to get it with his dialysis.  I discussed the case with Dr. Grayland Ormond, oncology at [facility].  He informs me that if patient is not symptomatic then he can be managed as an outpatient.  He will speak with his partner, however he would appreciate if I can send a secure epic message to Dr. Talbert Cage as well, which I have done.  Strict ER return precautions have been discussed, and patient is agreeing with the plan and is comfortable with the workup done and the recommendations from the ER.    Final Clinical Impressions(s) / ED Diagnoses   Final diagnoses:  Severe anemia    ED Discharge Orders    None       Varney Biles, MD 06/18/19 [place], Deja Kaigler, MD 06/18/19 1442

## 2019-06-18 NOTE — Discharge Instructions (Addendum)
We saw the ER for recheck of your hemoglobin. We spoke with the hematology team and they recommend that you see them in the clinic for transfusion.  Please call the number provided to get an appointment sooner than the one you have on December 1.  I have also emailed your hematologist to make sure you are seen sooner.  You are safe to get dialysis today.  You need to get blood transfusion emergently if you start having shortness of breath, severe chest pain, dizziness, near fainting.

## 2019-07-08 ENCOUNTER — Inpatient Hospital Stay: Payer: Medicare Other | Admitting: Oncology

## 2019-07-21 ENCOUNTER — Inpatient Hospital Stay: Payer: Medicare Other | Attending: Oncology | Admitting: Oncology

## 2019-07-22 ENCOUNTER — Ambulatory Visit: Payer: Medicare Other | Admitting: Oncology

## 2019-08-05 ENCOUNTER — Ambulatory Visit: Admission: RE | Admit: 2019-08-05 | Payer: Medicare Other | Source: Ambulatory Visit

## 2019-08-07 ENCOUNTER — Ambulatory Visit: Payer: Medicare Other | Admitting: Oncology

## 2019-09-05 ENCOUNTER — Ambulatory Visit: Payer: Medicare (Managed Care)

## 2019-10-21 ENCOUNTER — Ambulatory Visit: Payer: Self-pay | Admitting: General Surgery

## 2019-10-23 ENCOUNTER — Ambulatory Visit: Payer: Self-pay | Admitting: General Surgery

## 2019-12-03 ENCOUNTER — Telehealth: Payer: Self-pay

## 2019-12-03 NOTE — Telephone Encounter (Signed)
Done...  MD Only appt has been scheduled as requested per MD.. Called pt and made him aware

## 2019-12-03 NOTE — Telephone Encounter (Signed)
Please schedule patient for follow up. MD only. Please notify pt. Thanks

## 2019-12-12 ENCOUNTER — Inpatient Hospital Stay: Payer: Medicare (Managed Care) | Admitting: Oncology

## 2019-12-12 ENCOUNTER — Telehealth: Payer: Self-pay | Admitting: *Deleted

## 2019-12-12 NOTE — Telephone Encounter (Signed)
Called pt @ 10am to see if he had forgotten about his 12/12/19 MD appt sched for 915. Pt stated that he wasn't feeling well and requested to R/S to late nx week. Appt R/S per pt request.

## 2019-12-18 ENCOUNTER — Encounter: Payer: Self-pay | Admitting: *Deleted

## 2019-12-19 ENCOUNTER — Inpatient Hospital Stay: Payer: Medicare (Managed Care) | Admitting: Oncology

## 2019-12-23 ENCOUNTER — Encounter: Payer: Self-pay | Admitting: Oncology

## 2019-12-23 ENCOUNTER — Other Ambulatory Visit: Payer: Self-pay

## 2019-12-23 ENCOUNTER — Inpatient Hospital Stay: Payer: Medicare (Managed Care) | Attending: Oncology | Admitting: Oncology

## 2019-12-23 VITALS — BP 184/139 | HR 105 | Temp 97.5°F | Resp 18 | Wt 135.0 lb

## 2019-12-23 DIAGNOSIS — I132 Hypertensive heart and chronic kidney disease with heart failure and with stage 5 chronic kidney disease, or end stage renal disease: Secondary | ICD-10-CM | POA: Diagnosis present

## 2019-12-23 DIAGNOSIS — E785 Hyperlipidemia, unspecified: Secondary | ICD-10-CM | POA: Insufficient documentation

## 2019-12-23 DIAGNOSIS — Z992 Dependence on renal dialysis: Secondary | ICD-10-CM | POA: Diagnosis not present

## 2019-12-23 DIAGNOSIS — N186 End stage renal disease: Secondary | ICD-10-CM | POA: Diagnosis not present

## 2019-12-23 DIAGNOSIS — D631 Anemia in chronic kidney disease: Secondary | ICD-10-CM | POA: Diagnosis not present

## 2019-12-23 DIAGNOSIS — R5383 Other fatigue: Secondary | ICD-10-CM | POA: Insufficient documentation

## 2019-12-23 DIAGNOSIS — I252 Old myocardial infarction: Secondary | ICD-10-CM | POA: Insufficient documentation

## 2019-12-23 DIAGNOSIS — Z7982 Long term (current) use of aspirin: Secondary | ICD-10-CM | POA: Diagnosis not present

## 2019-12-23 DIAGNOSIS — Z8673 Personal history of transient ischemic attack (TIA), and cerebral infarction without residual deficits: Secondary | ICD-10-CM | POA: Insufficient documentation

## 2019-12-23 DIAGNOSIS — Z79899 Other long term (current) drug therapy: Secondary | ICD-10-CM | POA: Diagnosis not present

## 2019-12-23 DIAGNOSIS — R21 Rash and other nonspecific skin eruption: Secondary | ICD-10-CM | POA: Diagnosis not present

## 2019-12-23 DIAGNOSIS — I5042 Chronic combined systolic (congestive) and diastolic (congestive) heart failure: Secondary | ICD-10-CM | POA: Diagnosis not present

## 2019-12-23 DIAGNOSIS — K219 Gastro-esophageal reflux disease without esophagitis: Secondary | ICD-10-CM | POA: Diagnosis not present

## 2019-12-23 NOTE — Progress Notes (Signed)
Patient here for follow up. Pt reports he has been vomitting every night for approx 3-4 weeks.

## 2019-12-23 NOTE — Progress Notes (Signed)
Hematology/Oncology Follow up note [facility] Telephone:([phone]) [phone] [phone]) [phone]   Patient Care Team: Gwenlyn Saran, MD as PCP - General (Family Medicine) Burnell Blanks, MD as PCP - Cardiology (Cardiology)  REFERRING PROVIDER: Dr.Singh Sheilah Mins REASON FOR VISIT Follow up for treatment of anemia  HISTORY OF PRESENTING ILLNESS:  Gurveer Colucci is a  49 y.o.  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  Patient reports that he has been getting Epogen with dialysis for about a year and a half.  Recently he has developed skin rash on buttock, also field questionable throat itching after he received Epogen injection.  Epogen was hold.  With further questioning, patient reports using new body wash during the same time period.  Patient has anemia of chronic kidney disease and was referred to [facility] for further evaluation. Patient reports feeling fatigue.  Denies any hematochezia or hematemesis, black tarry stool.  His rash improved after topical steroid cream  Patient was recently admitted from 08/20/2020 08/22/2018 due to missing almost 4 weeks of dialysis.  Patient was urgently dialyzed, patient left AGAINST MEDICAL ADVICE Patient recalls history of 7-8 blood transfusions in the past.  He also recalls having received about 7 IV iron treatment along with his dialysis in the past.   INTERVAL HISTORY Michiah Rashaad Wimberly is a 49 y.o. male who as above history reviewed by me today presents for follow-up visit for management of anemia secondary to chronic kidney disease, iron overload due to multiple blood transfusion. Patient no showed to his appointment with me multiple times. He no-show to this morning's appointment and presented to the clinic in the afternoon and hope to be seen. Reports not feeling well.  Intermittent nausea, no  exacerbating or alleviating factors.  Patient feels that Aranesp is causing him to feel this way. Patient reports that he has been "compliant" with hemodialysis and Aranesp injections.  Nephrology records were not available to me.  He admitted to not getting some of treatments due to being sick denies any fever, chills.  He reports feeling fatigued. All his blood work were done at [facility] dialysis center as he does not have IV access for lab work at the [facility] center.  Review of Systems  Constitutional: Positive for fatigue. Negative for appetite change, chills, fever and unexpected weight change.  HENT:   Negative for hearing loss and voice change.   Eyes: Negative for eye problems and icterus.  Respiratory: Negative for chest tightness, cough and shortness of breath.   Cardiovascular: Negative for chest pain and leg swelling.  Gastrointestinal: Positive for nausea. Negative for abdominal distention and abdominal pain.  Endocrine: Negative for hot flashes.  Genitourinary: Negative for difficulty urinating, dysuria and frequency.   Musculoskeletal: Negative for arthralgias.  Skin: Negative for itching and rash.  Neurological: Negative for light-headedness and numbness.  Hematological: Negative for adenopathy. Does not bruise/bleed easily.  Psychiatric/Behavioral: Negative for confusion.   MEDICAL HISTORY:  Past Medical History:  Diagnosis Date  . AAA (abdominal aortic aneurysm) ([place])   . Chronic anemia   . Chronic combined systolic and diastolic CHF (congestive heart failure) ([place])   . End stage renal disease ([place])   . ESRD on dialysis ([place])   . FSGS (focal segmental glomerulosclerosis)   . GERD (gastroesophageal reflux disease)   . HLD (hyperlipidemia)   . Hypertension   .  NSTEMI (non-ST elevated myocardial infarction) ([place]) 07/2017   pt refused cath  . Pericardial effusion    a. small by echo 06/2017.  Marland Kitchen Renal disorder   . Seizure ([place])   . Stroke (cerebrum) [facility])      SURGICAL HISTORY: Past Surgical History:  Procedure Laterality Date  . ABDOMINAL AORTIC ANEURYSM REPAIR    . KIDNEY SURGERY      SOCIAL HISTORY: Social History   Socioeconomic History  . Marital status: Divorced    Spouse name: Not on file  . Number of children: 5  . Years of education: 16  . Highest education level: Bachelor's degree (e.g., BA, AB, BS)  Occupational History    Comment: unemployed  Tobacco Use  . Smoking status: Never Smoker  . Smokeless tobacco: Never Used  Substance and Sexual Activity  . Alcohol use: No  . Drug use: No  . Sexual activity: Not Currently  Other Topics Concern  . Not on file  Social History Narrative  . Not on file   Social Determinants of Health   Financial Resource Strain:   . Difficulty of Paying Living Expenses:   Food Insecurity:   . Worried About Charity fundraiser in the Last Year:   . Arboriculturist in the Last Year:   Transportation Needs:   . Film/video editor (Medical):   Marland Kitchen Lack of Transportation (Non-Medical):   Physical Activity:   . Days of Exercise per Week:   . Minutes of Exercise per Session:   Stress:   . Feeling of Stress :   Social Connections:   . Frequency of Communication with Friends and Family:   . Frequency of Social Gatherings with Friends and Family:   . Attends Religious Services:   . Active Member of Clubs or Organizations:   . Attends Archivist Meetings:   Marland Kitchen Marital Status:   Intimate Partner Violence:   . Fear of Current or Ex-Partner:   . Emotionally Abused:   Marland Kitchen Physically Abused:   . Sexually Abused:     FAMILY HISTORY: Family History  Problem Relation Age of Onset  . Hypertension Mother   . Kidney disease Mother   . Asthma Father   . Hypertension Father   . Kidney disease Father   . Hypertension Sister   . Heart disease Sister   . Kidney disease Sister   . Heart disease Brother   . Hypertension Brother   . COPD Brother     ALLERGIES:  is allergic to  clonidine derivatives; hydralazine hcl; and losartan potassium-hctz.  MEDICATIONS:  Current Outpatient Medications  Medication Sig Dispense Refill  . amLODipine (NORVASC) 10 MG tablet Take 10 mg by mouth daily.    Marland Kitchen aspirin EC 81 MG tablet Take 81 mg daily by mouth.    Marland Kitchen atorvastatin (LIPITOR) 40 MG tablet Take 40 mg at bedtime by mouth.    . calcitRIOL (ROCALTROL) 0.5 MCG capsule Take 0.5 mcg by mouth daily.     . carvedilol (COREG) 25 MG tablet Take 25 mg 2 (two) times daily with a meal by mouth.    . cholecalciferol (VITAMIN D) 1000 units tablet Take 1,000 Units by mouth 2 (two) times daily.    . cloNIDine (CATAPRES) 0.2 MG tablet Take 0.2 mg by mouth 2 (two) times daily.  23  . furosemide (LASIX) 80 MG tablet Take 80 mg at bedtime by mouth.    . hydrALAZINE (APRESOLINE) 100 MG tablet Take 100 mg by  mouth 3 (three) times daily.    . hydrOXYzine (ATARAX/VISTARIL) 10 MG tablet Take 10 mg by mouth daily as needed for itching or anxiety.     . irbesartan (AVAPRO) 150 MG tablet Take 150 mg by mouth daily.    . Lactobacillus Rhamnosus, GG, (CULTURELLE) CAPS Take 1 capsule by mouth daily.    . minoxidil (LONITEN) 2.5 MG tablet Take 2.5 mg by mouth 2 (two) times daily.    . multivitamin (RENA-VIT) TABS tablet Take 1 tablet by mouth 2 (two) times daily.    Marland Kitchen OLANZapine (ZYPREXA) 10 MG tablet Take 10 mg by mouth daily.    . Omega-3 Fatty Acids (OMEGA III EPA+DHA) 1000 MG CAPS Take 1,000 mg by mouth daily.     . pantoprazole (PROTONIX) 40 MG tablet Take 40 mg by mouth daily.     . sevelamer carbonate (RENVELA) 800 MG tablet Take 1,600 mg by mouth See admin instructions. Take 1,600 mg by mouth three times a day with meals and 1,600 mg with each snack    . tamsulosin (FLOMAX) 0.4 MG CAPS capsule TAKE 1 CAPSULE BY MOUTH EVERYDAY AT BEDTIME    . WHITE PETROLATUM-MINERAL OIL OP Apply 1 application topically 2 (two) times daily as needed (itching).      No current facility-administered medications for  this visit.     PHYSICAL EXAMINATION: ECOG PERFORMANCE STATUS: 1 - Symptomatic but completely ambulatory Vitals:   12/23/19 1434  BP: (!) 184/139  Pulse: (!) 105  Resp: 18  Temp: (!) 97.5 F (36.4 C)   Filed Weights   12/23/19 1434  Weight: 135 lb (61.2 kg)    Physical Exam Constitutional:      General: He is not in acute distress.    Appearance: He is well-developed. He is ill-appearing.  HENT:     Head: Normocephalic and atraumatic.     Right Ear: External ear normal.     Left Ear: External ear normal.  Eyes:     General: No scleral icterus.    Pupils: Pupils are equal, round, and reactive to light.  Cardiovascular:     Rate and Rhythm: Normal rate and regular rhythm.     Heart sounds: Normal heart sounds.  Pulmonary:     Effort: Pulmonary effort is normal. No respiratory distress.     Breath sounds: Normal breath sounds. No wheezing or rales.  Chest:     Chest wall: No tenderness.  Abdominal:     General: Bowel sounds are normal. There is no distension.     Palpations: Abdomen is soft. There is no mass.     Tenderness: There is no abdominal tenderness. There is no guarding.  Musculoskeletal:        General: No deformity. Normal range of motion.     Cervical back: Normal range of motion and neck supple.  Lymphadenopathy:     Cervical: No cervical adenopathy.  Skin:    General: Skin is warm and dry.     Coloration: Skin is pale.     Findings: No erythema or rash.  Neurological:     Mental Status: He is alert and oriented to person, place, and time.     Cranial Nerves: No cranial nerve deficit.     Coordination: Coordination normal.     Deep Tendon Reflexes: Reflexes normal.  Psychiatric:        Behavior: Behavior normal.        Thought Content: Thought content normal.      LABORATORY  DATA:  I have reviewed the data as listed Lab Results  Component Value Date   WBC 6.7 06/18/2019   HGB 6.8 (LL) 06/18/2019   HCT 19.8 (L) 06/18/2019   MCV 86.8  06/18/2019   PLT 272 06/18/2019   Recent Labs    06/18/19 0757  NA 141  K 4.7  CL 101  CO2 18*  GLUCOSE 96  BUN 111*  CREATININE 20.72*  CALCIUM 9.2  GFRNONAA 2*  GFRAA 3*  PROT 6.8  ALBUMIN 3.7  AST 67*  ALT 78*  ALKPHOS 166*  BILITOT 1.2   Labs from [facility] reviewed by me. 11/23/17.  Hemoglobin 8.5, vitamin B12 1199, folate 12.8, ferritin 1878.  Iron 59, TIBC 240.  WBC 6, MCV 88.6, platelet counts 2 41,000.  TSH 2.97 01/03/18   Hemoglobin 8.9, iron saturation 73, iron 160, TIBC 219, wbc 5.5, platelet 232,000,  01/07/2018 Hemoglobin 9.1 ferritin 1924, Iron saturation 38, TIBC 221 01/14/18  Hemoglobin 8.8, Hct 26.4. 03/06/2018 hemoglobin 7.1, HCT 22.4. 03/20/2018 hemoglobin 7.1 03/26/2018, ferritin 1690, saturation ratio 14.   08/21/2018 showed hemoglobin 6.5, hematocrit 18.7, platelet count 127. 08/17/2018, normal AST and ALT.  Normal albumin. 08/20/2018, ferritin 2105, iron saturation 63.  TIBC 183. 01/16/19 20 hemoglobin 7.8 Iron saturation 23, iron 44, TIBC 192. 12/02/2019, hemoglobin 6.8, hematocrit 20.4. Iron 43, TIBC 200 06, iron saturation 21, ferritin 1233. 12/11/2019, hemoglobin 6.4, hematocrit 19.2.   ASSESSMENT & PLAN:  1. Anemia due to end stage renal disease ([place])   2. ESRD (end stage renal disease) on dialysis ([place])   3. Iron overload due to repeated red blood cell transfusions   #Anemia, due to chronic kidney failure/ESRD Patient is supposed to be on Aranesp 25 MCG weekly.  Unclear if he has been compliant with his hemodialysis and erythropoietin treatments or not. I have a lengthy discussion with him and recommend patient to improve compliance. I will defer to patient's nephrologist of further titrating his Aranesp dose.  #Iron overload due to repeated blood cell transfusion in the past. Most recent iron panel shows ferritin above 1000, iron saturation 21. 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 I will obtain AFP every 6  months. Uncontrolled hypertension ESRD, blood pressure is managed by nephrology.  Continue follow-up.   The patient knows to call the clinic with any problems questions or concerns.  Return of visit 6 months  Earlie Server, MD, PhD 12/23/2019

## 2020-01-16 ENCOUNTER — Other Ambulatory Visit: Payer: Self-pay

## 2020-01-16 ENCOUNTER — Ambulatory Visit
Admission: RE | Admit: 2020-01-16 | Discharge: 2020-01-16 | Disposition: A | Discharge status: Home / Self Care | Payer: Medicare (Managed Care) | Source: Ambulatory Visit | Attending: Nephrology | Admitting: Nephrology

## 2020-01-16 DIAGNOSIS — D649 Anemia, unspecified: Secondary | ICD-10-CM | POA: Diagnosis not present

## 2020-01-16 LAB — HEMOGLOBIN AND HEMATOCRIT, BLOOD
HCT: 21.3 % — ABNORMAL LOW (ref 39.0–52.0)
Hemoglobin: 7 g/dL — ABNORMAL LOW (ref 13.0–17.0)

## 2020-01-16 LAB — PREPARE RBC (CROSSMATCH)

## 2020-01-16 LAB — ABO/RH: ABO/RH(D): A POS

## 2020-01-16 NOTE — Progress Notes (Signed)
Patient's diastolic blood pressure has averaged in the 120's over multiple readings. Dr Holley Raring was secure messaged and provided information about the high diastolic readings. A request for direction on whether to continue the blood transfusion was also sent. Dr Holley Raring messaged back and stated that he is aware of the high diastolic pressures and directed [place] to continue the blood transfusion.

## 2020-01-18 LAB — TYPE AND SCREEN
ABO/RH(D): A POS
Antibody Screen: NEGATIVE
Unit division: 0

## 2020-01-18 LAB — BPAM RBC
Blood Product Expiration Date: 202107022359
ISSUE DATE / TIME: 202106110944
Unit Type and Rh: 6200

## 2020-06-22 ENCOUNTER — Encounter: Payer: Self-pay | Admitting: Oncology

## 2020-06-22 ENCOUNTER — Inpatient Hospital Stay: Payer: Medicare (Managed Care) | Admitting: Oncology

## 2020-07-20 ENCOUNTER — Encounter: Payer: Self-pay | Admitting: Oncology

## 2020-07-20 ENCOUNTER — Inpatient Hospital Stay: Payer: Medicare (Managed Care) | Attending: Oncology | Admitting: Oncology

## 2020-07-20 VITALS — BP 178/127 | HR 96 | Temp 98.4°F | Resp 16 | Wt 143.2 lb

## 2020-07-20 DIAGNOSIS — Z992 Dependence on renal dialysis: Secondary | ICD-10-CM | POA: Diagnosis not present

## 2020-07-20 DIAGNOSIS — I132 Hypertensive heart and chronic kidney disease with heart failure and with stage 5 chronic kidney disease, or end stage renal disease: Secondary | ICD-10-CM | POA: Diagnosis not present

## 2020-07-20 DIAGNOSIS — K219 Gastro-esophageal reflux disease without esophagitis: Secondary | ICD-10-CM | POA: Insufficient documentation

## 2020-07-20 DIAGNOSIS — Z79899 Other long term (current) drug therapy: Secondary | ICD-10-CM | POA: Diagnosis not present

## 2020-07-20 DIAGNOSIS — N186 End stage renal disease: Secondary | ICD-10-CM | POA: Diagnosis not present

## 2020-07-20 DIAGNOSIS — E785 Hyperlipidemia, unspecified: Secondary | ICD-10-CM | POA: Insufficient documentation

## 2020-07-20 DIAGNOSIS — Z7982 Long term (current) use of aspirin: Secondary | ICD-10-CM | POA: Diagnosis not present

## 2020-07-20 DIAGNOSIS — R21 Rash and other nonspecific skin eruption: Secondary | ICD-10-CM | POA: Diagnosis not present

## 2020-07-20 DIAGNOSIS — I5042 Chronic combined systolic (congestive) and diastolic (congestive) heart failure: Secondary | ICD-10-CM | POA: Diagnosis not present

## 2020-07-20 DIAGNOSIS — I252 Old myocardial infarction: Secondary | ICD-10-CM | POA: Diagnosis not present

## 2020-07-20 DIAGNOSIS — Z8673 Personal history of transient ischemic attack (TIA), and cerebral infarction without residual deficits: Secondary | ICD-10-CM | POA: Insufficient documentation

## 2020-07-20 DIAGNOSIS — R5383 Other fatigue: Secondary | ICD-10-CM | POA: Insufficient documentation

## 2020-07-20 DIAGNOSIS — D631 Anemia in chronic kidney disease: Secondary | ICD-10-CM | POA: Diagnosis not present

## 2020-07-20 NOTE — Progress Notes (Signed)
Hematology/Oncology Follow up note [facility] Telephone:([phone]) [phone] Fax:([phone]) [phone]   Patient Care Team: Gwenlyn Saran, MD as PCP - General (Family Medicine) Burnell Blanks, MD as PCP - Cardiology (Cardiology)  REFERRING PROVIDER: Dr.Singh Sheilah Mins REASON FOR VISIT Follow up for treatment of anemia  HISTORY OF PRESENTING ILLNESS:  Angel [name] is a  49 y.o.  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  Patient reports that he has been getting Epogen with dialysis for about a year and a half.  Recently he has developed skin rash on buttock, also field questionable throat itching after he received Epogen injection.  Epogen was hold.  With further questioning, patient reports using new body wash during the same time period.  Patient has anemia of chronic kidney disease and was referred to [facility] for further evaluation. Patient reports feeling fatigue.  Denies any hematochezia or hematemesis, black tarry stool.  His rash improved after topical steroid cream  Patient was recently admitted from 08/20/2020 08/22/2018 due to missing almost 4 weeks of dialysis.  Patient was urgently dialyzed, patient left AGAINST MEDICAL ADVICE Patient recalls history of 7-8 blood transfusions in the past.  He also recalls having received about 7 IV iron treatment along with his dialysis in the past.   INTERVAL HISTORY Angel [name] is a 49 y.o. male who as above history reviewed by me today presents for follow-up visit for management of anemia secondary to chronic kidney disease, iron overload due to multiple blood transfusion. Patient no showed to his appointment with me recently. Patient has blood work done which was scanned to [employer]. Hemoglobin was 10, iron saturation 19.  Ferritin was not done .  Patient reports that he has been more compliant  with hemodialysis and he gets erythropoietin therapy through his dialysis. He has been also eating healthy and goes to the gym With no new complaints.  Patient has gained weight as well.  Review of Systems  Constitutional: Positive for fatigue. Negative for appetite change, chills, fever and unexpected weight change.  HENT:   Negative for hearing loss and voice change.   Eyes: Negative for eye problems and icterus.  Respiratory: Negative for chest tightness, cough and shortness of breath.   Cardiovascular: Negative for chest pain and leg swelling.  Gastrointestinal: Negative for abdominal distention, abdominal pain and nausea.  Endocrine: Negative for hot flashes.  Genitourinary: Negative for difficulty urinating, dysuria and frequency.   Musculoskeletal: Negative for arthralgias.  Skin: Negative for itching and rash.  Neurological: Negative for light-headedness and numbness.  Hematological: Negative for adenopathy. Does not bruise/bleed easily.  Psychiatric/Behavioral: Negative for confusion.   MEDICAL HISTORY:  Past Medical History:  Diagnosis Date  . AAA (abdominal aortic aneurysm) ([place])   . Chronic anemia   . Chronic combined systolic and diastolic CHF (congestive heart failure) ([place])   . End stage renal disease ([place])   . ESRD on dialysis ([place])   . FSGS (focal segmental glomerulosclerosis)   . GERD (gastroesophageal reflux disease)   . HLD (hyperlipidemia)   . Hypertension   . NSTEMI (non-ST elevated myocardial infarction) ([place]) 07/2017   pt refused cath  . Pericardial effusion    a. small by echo 06/2017.  Marland Kitchen Renal disorder   . Seizure ([place])   . Stroke (cerebrum) [facility])     SURGICAL HISTORY: Past Surgical History:  Procedure Laterality Date  . ABDOMINAL AORTIC ANEURYSM REPAIR    . KIDNEY SURGERY      SOCIAL HISTORY: Social History   Socioeconomic History  . Marital status: Divorced    Spouse name: Not on file  . Number of children: 5  . Years of education: 16   . Highest education level: Bachelor's degree (e.g., BA, AB, BS)  Occupational History    Comment: unemployed  Tobacco Use  . Smoking status: Never Smoker  . Smokeless tobacco: Never Used  Vaping Use  . Vaping Use: Never used  Substance and Sexual Activity  . Alcohol use: No  . Drug use: No  . Sexual activity: Not Currently  Other Topics Concern  . Not on file  Social History Narrative  . Not on file   Social Determinants of Health   Financial Resource Strain: Not on file  Food Insecurity: Not on file  Transportation Needs: Not on file  Physical Activity: Not on file  Stress: Not on file  Social Connections: Not on file  Intimate Partner Violence: Not on file    FAMILY HISTORY: Family History  Problem Relation Age of Onset  . Hypertension Mother   . Kidney disease Mother   . Asthma Father   . Hypertension Father   . Kidney disease Father   . Hypertension Sister   . Heart disease Sister   . Kidney disease Sister   . Heart disease Brother   . Hypertension Brother   . COPD Brother     ALLERGIES:  is allergic to clonidine derivatives, hydralazine hcl, and losartan potassium-hctz.  MEDICATIONS:  Current Outpatient Medications  Medication Sig Dispense Refill  . amLODipine (NORVASC) 10 MG tablet Take 10 mg by mouth daily.    Marland Kitchen atorvastatin (LIPITOR) 40 MG tablet Take 40 mg at bedtime by mouth.    . calcitRIOL (ROCALTROL) 0.5 MCG capsule Take 0.5 mcg by mouth daily.     . carvedilol (COREG) 25 MG tablet Take 25 mg 2 (two) times daily with a meal by mouth.    . cholecalciferol (VITAMIN D) 1000 units tablet Take 1,000 Units by mouth 2 (two) times daily.    . cloNIDine (CATAPRES) 0.2 MG tablet Take 0.2 mg by mouth 2 (two) times daily.  23  . furosemide (LASIX) 80 MG tablet Take 80 mg at bedtime by mouth.    . hydrALAZINE (APRESOLINE) 100 MG tablet Take 100 mg by mouth 3 (three) times daily.    . hydrOXYzine (ATARAX/VISTARIL) 10 MG tablet Take 10 mg by mouth daily as  needed for itching or anxiety.     . irbesartan (AVAPRO) 150 MG tablet Take 150 mg by mouth daily.    . Lactobacillus Rhamnosus, GG, (CULTURELLE) CAPS Take 1 capsule by mouth daily.    . minoxidil (LONITEN) 2.5 MG tablet Take 2.5 mg by mouth 2 (two) times daily.    . multivitamin (RENA-VIT) TABS tablet Take 1 tablet by mouth 2 (two) times daily.    Marland Kitchen OLANZapine (ZYPREXA) 10 MG tablet Take 10 mg by mouth daily.    . Omega-3 Fatty Acids (OMEGA III EPA+DHA) 1000 MG CAPS Take 1,000 mg by mouth daily.     . pantoprazole (PROTONIX) 40 MG tablet Take 40 mg by mouth daily.     . sevelamer carbonate (RENVELA) 800 MG tablet Take 1,600 mg by mouth See admin instructions. Take 1,600 mg by mouth three times a day with meals and 1,600 mg with each snack    .  tamsulosin (FLOMAX) 0.4 MG CAPS capsule TAKE 1 CAPSULE BY MOUTH EVERYDAY AT BEDTIME    . WHITE PETROLATUM-MINERAL OIL OP Apply 1 application topically 2 (two) times daily as needed (itching).     Marland Kitchen aspirin EC 81 MG tablet Take 81 mg daily by mouth. (Patient not taking: Reported on 07/20/2020)     No current facility-administered medications for this visit.     PHYSICAL EXAMINATION: ECOG PERFORMANCE STATUS: 1 - Symptomatic but completely ambulatory Vitals:   07/20/20 1417 07/20/20 1423  BP: (!) 160/123 (!) 178/127  Pulse: (!) 102 96  Resp: 16   Temp: 98.4 F (36.9 C)    Filed Weights   07/20/20 1417  Weight: 143 lb 3.2 oz (65 kg)    Physical Exam Constitutional:      General: He is not in acute distress.    Appearance: He is well-developed.  HENT:     Head: Normocephalic and atraumatic.     Right Ear: External ear normal.     Left Ear: External ear normal.  Eyes:     General: No scleral icterus.    Pupils: Pupils are equal, round, and reactive to light.  Cardiovascular:     Rate and Rhythm: Normal rate and regular rhythm.     Heart sounds: Normal heart sounds.  Pulmonary:     Effort: Pulmonary effort is normal. No respiratory  distress.     Breath sounds: Normal breath sounds. No wheezing or rales.  Chest:     Chest wall: No tenderness.  Abdominal:     General: Bowel sounds are normal. There is no distension.     Palpations: Abdomen is soft. There is no mass.     Tenderness: There is no abdominal tenderness. There is no guarding.  Musculoskeletal:        General: No deformity. Normal range of motion.     Cervical back: Normal range of motion and neck supple.  Lymphadenopathy:     Cervical: No cervical adenopathy.  Skin:    General: Skin is warm and dry.     Coloration: Skin is not pale.     Findings: No erythema or rash.  Neurological:     Mental Status: He is alert and oriented to person, place, and time.     Cranial Nerves: No cranial nerve deficit.     Coordination: Coordination normal.     Deep Tendon Reflexes: Reflexes normal.  Psychiatric:        Mood and Affect: Mood normal.      LABORATORY DATA:  I have reviewed the data as listed Lab Results  Component Value Date   WBC 6.7 06/18/2019   HGB 7.0 (L) 01/16/2020   HCT 21.3 (L) 01/16/2020   MCV 86.8 06/18/2019   PLT 272 06/18/2019   No results for input(s): NA, K, CL, CO2, GLUCOSE, BUN, CREATININE, CALCIUM, GFRNONAA, GFRAA, PROT, ALBUMIN, AST, ALT, ALKPHOS, BILITOT, BILIDIR, IBILI in the last 8760 hours. Labs from [place] reviewed by me. 11/23/17.  Hemoglobin 8.5, vitamin B12 1199, folate 12.8, ferritin 1878.  Iron 59, TIBC 240.  WBC 6, MCV 88.6, platelet counts 2 41,000.  TSH 2.97 01/03/18   Hemoglobin 8.9, iron saturation 73, iron 160, TIBC 219, wbc 5.5, platelet 232,000,  01/07/2018 Hemoglobin 9.1 ferritin 1924, Iron saturation 38, TIBC 221 01/14/18  Hemoglobin 8.8, Hct 26.4. 03/06/2018 hemoglobin 7.1, HCT 22.4. 03/20/2018 hemoglobin 7.1 03/26/2018, ferritin 1690, saturation ratio 14.   08/21/2018 showed hemoglobin 6.5, hematocrit 18.7, platelet count 127. 08/17/2018, normal  AST and ALT.  Normal albumin. 08/20/2018, ferritin 2105, iron  saturation 63.  TIBC 183. 01/16/19 20 hemoglobin 7.8 Iron saturation 23, iron 44, TIBC 192. 12/02/2019, hemoglobin 6.8, hematocrit 20.4. Iron 43, TIBC 200 06, iron saturation 21, ferritin 1233. 12/11/2019, hemoglobin 6.4, hematocrit 19.2. 07/13/2020, hemoglobin 10.  ASSESSMENT & PLAN:  1. Anemia due to end stage renal disease ([place])   2. Iron overload due to repeated red blood cell transfusions   #Anemia, due to chronic kidney failure/ESRD Labs reviewed and discussed with patient. He has been doing better.  Hemoglobin is 10. Continue follow-up with nephrologist.  He gets erythropoietin treatments through dialysis  #History of iron overload due to multiple blood transfusion Recent iron saturation has been normalized to 19%. Discussed with patient that it is okay now if his nephrologist recommend IV iron treatment.  I recommend checking his iron levels 3-4 times per year   Since her erythropoietin treatment is now managed by nephrologist and previous iron overload has resolved.  I will discharge patient to continue follow-up with his nephrologist.  He agrees with the plan.  No need for follow-up at this point.  Earlie Server, MD, PhD 07/20/2020

## 2020-07-20 NOTE — Progress Notes (Signed)
Patient denies new problems/concerns today.  His bp is elevated in the office today and states he took his medications last night but the documented readings from today is what readings he gets at home.

## 2020-08-15 ENCOUNTER — Inpatient Hospital Stay (HOSPITAL_COMMUNITY)
Admission: EM | Admit: 2020-08-15 | Discharge: 2020-08-18 | DRG: 640 | Disposition: A | Discharge status: Home / Self Care | Payer: Medicare (Managed Care) | Attending: Internal Medicine | Admitting: Internal Medicine

## 2020-08-15 ENCOUNTER — Other Ambulatory Visit: Payer: Self-pay

## 2020-08-15 ENCOUNTER — Emergency Department (HOSPITAL_COMMUNITY): Payer: Medicare (Managed Care)

## 2020-08-15 DIAGNOSIS — K7689 Other specified diseases of liver: Secondary | ICD-10-CM | POA: Diagnosis present

## 2020-08-15 DIAGNOSIS — I2510 Atherosclerotic heart disease of native coronary artery without angina pectoris: Secondary | ICD-10-CM | POA: Diagnosis present

## 2020-08-15 DIAGNOSIS — J1282 Pneumonia due to coronavirus disease 2019: Secondary | ICD-10-CM | POA: Diagnosis present

## 2020-08-15 DIAGNOSIS — E875 Hyperkalemia: Secondary | ICD-10-CM | POA: Diagnosis not present

## 2020-08-15 DIAGNOSIS — J9601 Acute respiratory failure with hypoxia: Secondary | ICD-10-CM | POA: Diagnosis present

## 2020-08-15 DIAGNOSIS — R569 Unspecified convulsions: Secondary | ICD-10-CM | POA: Diagnosis present

## 2020-08-15 DIAGNOSIS — E785 Hyperlipidemia, unspecified: Secondary | ICD-10-CM | POA: Diagnosis present

## 2020-08-15 DIAGNOSIS — Z7982 Long term (current) use of aspirin: Secondary | ICD-10-CM

## 2020-08-15 DIAGNOSIS — I714 Abdominal aortic aneurysm, without rupture: Secondary | ICD-10-CM | POA: Diagnosis present

## 2020-08-15 DIAGNOSIS — G9341 Metabolic encephalopathy: Secondary | ICD-10-CM | POA: Diagnosis present

## 2020-08-15 DIAGNOSIS — Z8673 Personal history of transient ischemic attack (TIA), and cerebral infarction without residual deficits: Secondary | ICD-10-CM

## 2020-08-15 DIAGNOSIS — Z91158 Patient's noncompliance with renal dialysis for other reason: Secondary | ICD-10-CM

## 2020-08-15 DIAGNOSIS — R000 Tachycardia, unspecified: Secondary | ICD-10-CM | POA: Diagnosis present

## 2020-08-15 DIAGNOSIS — Z9115 Patient's noncompliance with renal dialysis: Secondary | ICD-10-CM

## 2020-08-15 DIAGNOSIS — I313 Pericardial effusion (noninflammatory): Secondary | ICD-10-CM | POA: Diagnosis present

## 2020-08-15 DIAGNOSIS — Z8249 Family history of ischemic heart disease and other diseases of the circulatory system: Secondary | ICD-10-CM

## 2020-08-15 DIAGNOSIS — E8770 Fluid overload, unspecified: Principal | ICD-10-CM | POA: Diagnosis present

## 2020-08-15 DIAGNOSIS — R7989 Other specified abnormal findings of blood chemistry: Secondary | ICD-10-CM | POA: Diagnosis present

## 2020-08-15 DIAGNOSIS — Z992 Dependence on renal dialysis: Secondary | ICD-10-CM

## 2020-08-15 DIAGNOSIS — Z79899 Other long term (current) drug therapy: Secondary | ICD-10-CM

## 2020-08-15 DIAGNOSIS — N186 End stage renal disease: Secondary | ICD-10-CM

## 2020-08-15 DIAGNOSIS — J9811 Atelectasis: Secondary | ICD-10-CM | POA: Diagnosis present

## 2020-08-15 DIAGNOSIS — U071 COVID-19: Secondary | ICD-10-CM | POA: Diagnosis not present

## 2020-08-15 DIAGNOSIS — G9340 Encephalopathy, unspecified: Secondary | ICD-10-CM

## 2020-08-15 DIAGNOSIS — Z885 Allergy status to narcotic agent status: Secondary | ICD-10-CM

## 2020-08-15 DIAGNOSIS — N19 Unspecified kidney failure: Secondary | ICD-10-CM | POA: Diagnosis present

## 2020-08-15 DIAGNOSIS — Z825 Family history of asthma and other chronic lower respiratory diseases: Secondary | ICD-10-CM

## 2020-08-15 DIAGNOSIS — G9349 Other encephalopathy: Secondary | ICD-10-CM | POA: Diagnosis present

## 2020-08-15 DIAGNOSIS — I252 Old myocardial infarction: Secondary | ICD-10-CM

## 2020-08-15 DIAGNOSIS — R278 Other lack of coordination: Secondary | ICD-10-CM | POA: Diagnosis present

## 2020-08-15 DIAGNOSIS — K219 Gastro-esophageal reflux disease without esophagitis: Secondary | ICD-10-CM | POA: Diagnosis present

## 2020-08-15 DIAGNOSIS — D631 Anemia in chronic kidney disease: Secondary | ICD-10-CM | POA: Diagnosis present

## 2020-08-15 DIAGNOSIS — I132 Hypertensive heart and chronic kidney disease with heart failure and with stage 5 chronic kidney disease, or end stage renal disease: Secondary | ICD-10-CM | POA: Diagnosis present

## 2020-08-15 DIAGNOSIS — I5042 Chronic combined systolic (congestive) and diastolic (congestive) heart failure: Secondary | ICD-10-CM | POA: Diagnosis present

## 2020-08-15 DIAGNOSIS — R944 Abnormal results of kidney function studies: Secondary | ICD-10-CM | POA: Diagnosis present

## 2020-08-15 DIAGNOSIS — I493 Ventricular premature depolarization: Secondary | ICD-10-CM | POA: Diagnosis present

## 2020-08-15 DIAGNOSIS — Z841 Family history of disorders of kidney and ureter: Secondary | ICD-10-CM

## 2020-08-15 DIAGNOSIS — I10 Essential (primary) hypertension: Secondary | ICD-10-CM | POA: Diagnosis present

## 2020-08-15 DIAGNOSIS — Z888 Allergy status to other drugs, medicaments and biological substances status: Secondary | ICD-10-CM

## 2020-08-15 LAB — CBC WITH DIFFERENTIAL/PLATELET
Abs Immature Granulocytes: 0.03 10*3/uL (ref 0.00–0.07)
Basophils Absolute: 0 10*3/uL (ref 0.0–0.1)
Basophils Relative: 0 %
Eosinophils Absolute: 0 10*3/uL (ref 0.0–0.5)
Eosinophils Relative: 0 %
HCT: 24.6 % — ABNORMAL LOW (ref 39.0–52.0)
Hemoglobin: 8.4 g/dL — ABNORMAL LOW (ref 13.0–17.0)
Immature Granulocytes: 1 %
Lymphocytes Relative: 7 %
Lymphs Abs: 0.4 10*3/uL — ABNORMAL LOW (ref 0.7–4.0)
MCH: 27.3 pg (ref 26.0–34.0)
MCHC: 34.1 g/dL (ref 30.0–36.0)
MCV: 79.9 fL — ABNORMAL LOW (ref 80.0–100.0)
Monocytes Absolute: 0.2 10*3/uL (ref 0.1–1.0)
Monocytes Relative: 4 %
Neutro Abs: 5.3 10*3/uL (ref 1.7–7.7)
Neutrophils Relative %: 88 %
Platelets: 145 10*3/uL — ABNORMAL LOW (ref 150–400)
RBC: 3.08 MIL/uL — ABNORMAL LOW (ref 4.22–5.81)
RDW: 16.1 % — ABNORMAL HIGH (ref 11.5–15.5)
WBC: 6.1 10*3/uL (ref 4.0–10.5)
nRBC: 0 % (ref 0.0–0.2)

## 2020-08-15 LAB — COMPREHENSIVE METABOLIC PANEL
ALT: 14 U/L (ref 0–44)
AST: 19 U/L (ref 15–41)
Albumin: 2.9 g/dL — ABNORMAL LOW (ref 3.5–5.0)
Alkaline Phosphatase: 81 U/L (ref 38–126)
Anion gap: 23 — ABNORMAL HIGH (ref 5–15)
BUN: 130 mg/dL — ABNORMAL HIGH (ref 6–20)
CO2: 18 mmol/L — ABNORMAL LOW (ref 22–32)
Calcium: 8.9 mg/dL (ref 8.9–10.3)
Chloride: 97 mmol/L — ABNORMAL LOW (ref 98–111)
Creatinine, Ser: 34.09 mg/dL — ABNORMAL HIGH (ref 0.61–1.24)
GFR, Estimated: 1 mL/min — ABNORMAL LOW (ref >60)
Glucose, Bld: 118 mg/dL — ABNORMAL HIGH (ref 70–99)
Potassium: 5.7 mmol/L — ABNORMAL HIGH (ref 3.5–5.1)
Sodium: 138 mmol/L (ref 135–145)
Total Bilirubin: 1.1 mg/dL (ref 0.3–1.2)
Total Protein: 6.6 g/dL (ref 6.5–8.1)

## 2020-08-15 LAB — PROTIME-INR
INR: 1.2 (ref 0.8–1.2)
Prothrombin Time: 14.4 seconds (ref 11.4–15.2)

## 2020-08-15 LAB — I-STAT VENOUS BLOOD GAS, ED
Acid-base deficit: 3 mmol/L — ABNORMAL HIGH (ref 0.0–2.0)
Bicarbonate: 20.4 mmol/L (ref 20.0–28.0)
Calcium, Ion: 1.03 mmol/L — ABNORMAL LOW (ref 1.15–1.40)
HCT: 26 % — ABNORMAL LOW (ref 39.0–52.0)
Hemoglobin: 8.8 g/dL — ABNORMAL LOW (ref 13.0–17.0)
O2 Saturation: 98 %
Potassium: 5.7 mmol/L — ABNORMAL HIGH (ref 3.5–5.1)
Sodium: 136 mmol/L (ref 135–145)
TCO2: 21 mmol/L — ABNORMAL LOW (ref 22–32)
pCO2, Ven: 29.6 mmHg — ABNORMAL LOW (ref 44.0–60.0)
pH, Ven: 7.447 — ABNORMAL HIGH (ref 7.250–7.430)
pO2, Ven: 95 mmHg — ABNORMAL HIGH (ref 32.0–45.0)

## 2020-08-15 LAB — PROCALCITONIN: Procalcitonin: 5.61 ng/mL

## 2020-08-15 LAB — RESP PANEL BY RT-PCR (FLU A&B, COVID) ARPGX2
Influenza A by PCR: NEGATIVE
Influenza B by PCR: NEGATIVE
SARS Coronavirus 2 by RT PCR: POSITIVE — AB

## 2020-08-15 LAB — LACTIC ACID, PLASMA
Lactic Acid, Venous: 1.3 mmol/L (ref 0.5–1.9)
Lactic Acid, Venous: 1 mmol/L (ref 0.5–1.9)

## 2020-08-15 LAB — POC SARS CORONAVIRUS 2 AG -  ED: SARS Coronavirus 2 Ag: POSITIVE — AB

## 2020-08-15 LAB — D-DIMER, QUANTITATIVE: D-Dimer, Quant: 7.04 ug/mL-FEU — ABNORMAL HIGH (ref 0.00–0.50)

## 2020-08-15 LAB — FIBRINOGEN: Fibrinogen: 452 mg/dL (ref 210–475)

## 2020-08-15 LAB — LACTATE DEHYDROGENASE: LDH: 331 U/L — ABNORMAL HIGH (ref 98–192)

## 2020-08-15 LAB — TRIGLYCERIDES: Triglycerides: 410 mg/dL — ABNORMAL HIGH (ref <150)

## 2020-08-15 LAB — APTT: aPTT: 40 seconds — ABNORMAL HIGH (ref 24–36)

## 2020-08-15 MED ORDER — LABETALOL HCL 5 MG/ML IV SOLN
5.0000 mg | INTRAVENOUS | Status: DC | PRN
  Administered 2020-08-15 – 2020-08-16 (×4): 10 mg via INTRAVENOUS
  Administered 2020-08-16: 5 mg via INTRAVENOUS
  Administered 2020-08-17: 10 mg via INTRAVENOUS
  Filled 2020-08-15 (×5): qty 4

## 2020-08-15 MED ORDER — LACTATED RINGERS IV SOLN: INTRAVENOUS | Status: DC

## 2020-08-15 MED ORDER — SODIUM CHLORIDE 0.9 % IV SOLN
2.0000 g | Freq: Once | INTRAVENOUS | Status: AC
  Administered 2020-08-15: 2 g via INTRAVENOUS
  Filled 2020-08-15: qty 2

## 2020-08-15 MED ORDER — METRONIDAZOLE 500 MG PO TABS
500.0000 mg | ORAL_TABLET | Freq: Three times a day (TID) | ORAL | Status: DC
  Administered 2020-08-15 – 2020-08-17 (×4): 500 mg via ORAL
  Filled 2020-08-15 (×5): qty 1

## 2020-08-15 MED ORDER — SODIUM CHLORIDE 0.9 % IV SOLN
200.0000 mg | Freq: Once | INTRAVENOUS | Status: AC
  Administered 2020-08-16: 200 mg via INTRAVENOUS
  Filled 2020-08-15: qty 40

## 2020-08-15 MED ORDER — SODIUM ZIRCONIUM CYCLOSILICATE 10 G PO PACK
10.0000 g | PACK | Freq: Once | ORAL | Status: AC
  Administered 2020-08-15: 10 g via ORAL
  Filled 2020-08-15: qty 1

## 2020-08-15 MED ORDER — VANCOMYCIN HCL IN DEXTROSE 1-5 GM/200ML-% IV SOLN
1000.0000 mg | Freq: Once | INTRAVENOUS | Status: DC
  Filled 2020-08-15: qty 200

## 2020-08-15 MED ORDER — DEXAMETHASONE SODIUM PHOSPHATE 10 MG/ML IJ SOLN
6.0000 mg | INTRAMUSCULAR | Status: DC
  Administered 2020-08-15: 6 mg via INTRAVENOUS
  Filled 2020-08-15: qty 1

## 2020-08-15 MED ORDER — CHLORHEXIDINE GLUCONATE CLOTH 2 % EX PADS
6.0000 | MEDICATED_PAD | Freq: Every day | CUTANEOUS | Status: DC
  Administered 2020-08-17: 6 via TOPICAL

## 2020-08-15 MED ORDER — ACETAMINOPHEN 325 MG PO TABS
650.0000 mg | ORAL_TABLET | Freq: Once | ORAL | Status: AC
  Administered 2020-08-15: 650 mg via ORAL

## 2020-08-15 MED ORDER — VANCOMYCIN HCL 750 MG/150ML IV SOLN
750.0000 mg | INTRAVENOUS | Status: DC
  Filled 2020-08-15: qty 150

## 2020-08-15 MED ORDER — SODIUM CHLORIDE 0.9 % IV SOLN
1.0000 g | INTRAVENOUS | Status: DC
  Administered 2020-08-16: 1 g via INTRAVENOUS
  Filled 2020-08-15 (×4): qty 1

## 2020-08-15 MED ORDER — VANCOMYCIN HCL 1500 MG/300ML IV SOLN
1500.0000 mg | Freq: Once | INTRAVENOUS | Status: AC
  Administered 2020-08-15: 2000 mg via INTRAVENOUS
  Filled 2020-08-15: qty 300

## 2020-08-15 MED ORDER — SODIUM BICARBONATE 8.4 % IV SOLN
50.0000 meq | Freq: Once | INTRAVENOUS | Status: AC
  Administered 2020-08-15: 50 meq via INTRAVENOUS
  Filled 2020-08-15: qty 50

## 2020-08-15 MED ORDER — SODIUM CHLORIDE 0.9 % IV SOLN
100.0000 mg | Freq: Every day | INTRAVENOUS | Status: DC
  Filled 2020-08-15: qty 20

## 2020-08-15 NOTE — ED Notes (Signed)
Pt is continuously pulling his leads and O2 monitoring equipment off. Pt has called out multiple times stating he is leaving AMA

## 2020-08-15 NOTE — Progress Notes (Signed)
Code Sepsis initiated @ 1922 PM. Elink following.

## 2020-08-15 NOTE — ED Notes (Signed)
Jana Half, mother, [phone] would like an update when available

## 2020-08-15 NOTE — ED Provider Notes (Signed)
[facility] EMERGENCY DEPARTMENT Provider Note   CSN: [id number] Arrival date & time: 08/15/20  1855     History Chief Complaint  Patient presents with  . Fever    Angel [name] is a 50 y.o. male history includes ESRD, AAA, GERD, hypertension, hyperlipidemia, CAD/NSTEMI, pericardial effusion, seizure, CVA.  Patient arrives via EMS today for fever cough lethargy, unclear onset of symptoms.  Patient noted to be hypoxic with SPO2 mid 70s on room air improved to 100% on 15 L nonrebreather.  Patient missed dialysis yesterday.  On examination patient is ill-appearing lethargic follows basic commands but is confused.  He is repetitive in speech, reports "they gave me too much" he cannot further elaborate on what he is talking about.  He denies any pain.  He cannot recall how long he has been feeling poorly for.  Level 5 caveat altered mental status.  HPI     Past Medical History:  Diagnosis Date  . AAA (abdominal aortic aneurysm) ([place])   . Chronic anemia   . Chronic combined systolic and diastolic CHF (congestive heart failure) ([place])   . End stage renal disease ([place])   . ESRD on dialysis ([place])   . FSGS (focal segmental glomerulosclerosis)   . GERD (gastroesophageal reflux disease)   . HLD (hyperlipidemia)   . Hypertension   . NSTEMI (non-ST elevated myocardial infarction) ([place]) 07/2017   pt refused cath  . Pericardial effusion    a. small by echo 06/2017.  Marland Kitchen Renal disorder   . Seizure ([place])   . Stroke (cerebrum) [facility])     Patient Active Problem List   Diagnosis Date Noted  . Acute hypercapnic respiratory failure ([place]) 08/20/2018  . Anemia 08/17/2018  . Acute renal failure (ARF) ([place]) 04/03/2018  . Chronic combined systolic and diastolic CHF (congestive heart failure) ([place]) 08/14/2017  . Thrombocytopenia ([place]) 07/30/2017  . Dialysis patient, noncompliant ([place])   . HTN (hypertension) 07/13/2017  . Non-ST elevation (NSTEMI) myocardial infarction  ([place])   . Elevated troponin   . Hyperkalemia 06/20/2017  . GERD (gastroesophageal reflux disease) 06/20/2017  . Seizure disorder ([place]) 06/20/2017  . Abnormal LFTs 06/20/2017  . Anemia in ESRD (end-stage renal disease) ([place]) 06/20/2017  . HLD (hyperlipidemia)   . Stroke (cerebrum) ([place])   . ESRD (end stage renal disease) on dialysis ([place])   . Acute on chronic diastolic CHF (congestive heart failure) ([place])   . Acute post-operative pain 02/14/2017  . Acute respiratory insufficiency 02/14/2017  . Febrile nonhemolytic transfusion reaction 02/13/2017  . FSGS (focal segmental glomerulosclerosis) 02/06/2017  . History of stroke 09/13/2016  . Palpitations 09/13/2016  . Abdominal aortic aneurysm (AAA) without rupture ([place]) 07/21/2016  . 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 Pre-transplant evaluation for kidney transplant 03/20/2016    Past Surgical History:  Procedure Laterality Date  . ABDOMINAL AORTIC ANEURYSM REPAIR    . KIDNEY SURGERY         Family History  Problem Relation Age of Onset  . Hypertension Mother   . Kidney disease Mother   . Asthma Father   . Hypertension Father   . Kidney disease Father   . Hypertension Sister   . Heart disease Sister   . Kidney disease Sister   . Heart disease Brother   . Hypertension Brother   . COPD Brother     Social History   Tobacco Use  . Smoking status: Never Smoker  . Smokeless tobacco: Never Used  Vaping Use  .  Vaping Use: Never used  Substance Use Topics  . Alcohol use: No  . Drug use: No    Home Medications Prior to Admission medications   Medication Sig Start Date End Date Taking? Authorizing Provider  atorvastatin (LIPITOR) 80 MG tablet Take 80 mg by mouth at bedtime.   Yes [provider]  cloNIDine (CATAPRES) 0.2 MG tablet Take 0.2 mg by mouth 2 (two) times daily. 10/19/17  Yes [provider]  Lactobacillus Rhamnosus, GG, (CULTURELLE) CAPS Take 1 capsule by mouth daily.   Yes [provider]  amLODipine (NORVASC) 10 MG tablet Take 10 mg by mouth daily.    [provider]  aspirin EC 81 MG tablet Take 81 mg daily by mouth. Patient not taking: Reported on 07/20/2020    [provider]  calcitRIOL (ROCALTROL) 0.5 MCG capsule Take 0.5 mcg by mouth daily.     [provider]  carvedilol (COREG) 25 MG tablet Take 25 mg 2 (two) times daily with a meal by mouth.    [provider]  cholecalciferol (VITAMIN D) 1000 units tablet Take 1,000 Units by mouth 2 (two) times daily.    [provider]  furosemide (LASIX) 80 MG tablet Take 80 mg at bedtime by mouth.    [provider]  hydrOXYzine (ATARAX/VISTARIL) 10 MG tablet Take 10 mg by mouth daily as needed for itching or anxiety.     [provider]  minoxidil (LONITEN) 2.5 MG tablet Take 2.5 mg by mouth 2 (two) times daily. 12/02/19   [provider]  multivitamin (RENA-VIT) TABS tablet Take 1 tablet by mouth 2 (two) times daily.    [provider]  OLANZapine (ZYPREXA) 10 MG tablet Take 10 mg by mouth daily. 01/02/19   [provider]  Omega-3 Fatty Acids (OMEGA III EPA+DHA) 1000 MG CAPS Take 1,000 mg by mouth daily.     [provider]  pantoprazole (PROTONIX) 40 MG tablet Take 40 mg by mouth daily.  07/03/16   [provider]  sevelamer carbonate (RENVELA) 800 MG tablet Take 1,600 mg by mouth See admin instructions. Take 1,600 mg by mouth three times a day with meals and 1,600 mg with each snack    [provider]  tamsulosin (FLOMAX) 0.4 MG CAPS capsule Take 0.4 mg by mouth at bedtime. 12/09/18   [provider]  WHITE PETROLATUM-MINERAL OIL OP Apply 1 application topically 2 (two) times daily as needed (itching).  03/12/17   [provider]    Allergies    Clonidine derivatives, Epoetin (alfa), Heparin, Hydralazine hcl, Irbesartan, and Losartan potassium-hctz  Review of Systems   Review of  Systems  Unable to perform ROS: Mental status change    Physical Exam Updated Vital Signs BP (!) 175/117   Pulse (!) 115   Temp (!) 102 F (38.9 C) (Axillary)   Resp (!) 29   Ht 5\' 5"  (1.651 m)   Wt 61.2 kg   SpO2 97%   BMI 22.47 kg/m   Physical Exam Constitutional:      General: He is not in acute distress.    Appearance: Normal appearance. He is well-developed. He is not ill-appearing or diaphoretic.  HENT:     Head: Normocephalic and atraumatic.  Eyes:     General: Vision grossly intact. Gaze aligned appropriately.     Pupils: Pupils are equal, round, and reactive to light.  Neck:     Trachea: Trachea and phonation normal.  Cardiovascular:  Rate and Rhythm: Tachycardia present.  Pulmonary:     Effort: Pulmonary effort is normal. Tachypnea present. No respiratory distress.     Breath sounds: Normal air entry. Rales present.  Abdominal:     General: There is no distension.     Palpations: Abdomen is soft.     Tenderness: There is no abdominal tenderness. There is no guarding or rebound.  Musculoskeletal:        General: Normal range of motion.     Cervical back: Normal range of motion.     Right lower leg: No edema.     Left lower leg: No edema.  Skin:    General: Skin is warm and dry.  Neurological:     Mental Status: He is alert.     GCS: GCS eye subscore is 4. GCS verbal subscore is 4. GCS motor subscore is 6.     Comments: Speech is clear, he is repetitive in speech and confused to time and event, he follows basic commands.  Equal strength with movements of the bilateral extremities.  No major cranial nerve deficits or facial droop.  Psychiatric:        Behavior: Behavior normal.    ED Results / Procedures / Treatments   Labs (all labs ordered are listed, but only abnormal results are displayed) Labs Reviewed  RESP PANEL BY RT-PCR (FLU A&B, COVID) ARPGX2 - Abnormal; Notable for the following components:      Result Value   SARS Coronavirus 2 by RT PCR  POSITIVE (*)    All other components within normal limits  COMPREHENSIVE METABOLIC PANEL - Abnormal; Notable for the following components:   Potassium 5.7 (*)    Chloride 97 (*)    CO2 18 (*)    Glucose, Bld 118 (*)    BUN 130 (*)    Creatinine, Ser 34.09 (*)    Albumin 2.9 (*)    GFR, Estimated 1 (*)    Anion gap 23 (*)    All other components within normal limits  CBC WITH DIFFERENTIAL/PLATELET - Abnormal; Notable for the following components:   RBC 3.08 (*)    Hemoglobin 8.4 (*)    HCT 24.6 (*)    MCV 79.9 (*)    RDW 16.1 (*)    Platelets 145 (*)    Lymphs Abs 0.4 (*)    All other components within normal limits  APTT - Abnormal; Notable for the following components:   aPTT 40 (*)    All other components within normal limits  I-STAT VENOUS BLOOD GAS, ED - Abnormal; Notable for the following components:   pH, Ven 7.447 (*)    pCO2, Ven 29.6 (*)    pO2, Ven 95.0 (*)    TCO2 21 (*)    Acid-base deficit 3.0 (*)    Potassium 5.7 (*)    Calcium, Ion 1.03 (*)    HCT 26.0 (*)    Hemoglobin 8.8 (*)    All other components within normal limits  POC SARS CORONAVIRUS 2 AG -  ED - Abnormal; Notable for the following components:   SARS Coronavirus 2 Ag POSITIVE (*)    All other components within normal limits  CULTURE, BLOOD (ROUTINE X 2)  CULTURE, BLOOD (ROUTINE X 2)  URINE CULTURE  LACTIC ACID, PLASMA  PROTIME-INR  LACTIC ACID, PLASMA  URINALYSIS, ROUTINE W REFLEX MICROSCOPIC  D-DIMER, QUANTITATIVE (NOT AT [facility])  PROCALCITONIN  LACTATE DEHYDROGENASE  FERRITIN  TRIGLYCERIDES  FIBRINOGEN  C-REACTIVE PROTEIN  RENAL  FUNCTION PANEL  I-STAT CHEM 8, ED    EKG EKG Interpretation  Date/Time:  Sunday August 15 2020 19:03:23 EST Ventricular Rate:  127 PR Interval:  112 QRS Duration: 86 QT Interval:  318 QTC Calculation: 462 R Axis:   0 Text Interpretation: Sinus tachycardia Left ventricular hypertrophy with repolarization abnormality ( Sokolow-Lyon , Cornell product )  Abnormal ECG When cmapred to prior, faster rate with t wave inversions. No STEMI Confirmed by Antony Blackbird [phone]) on 08/15/2020 7:44:25 PM   Radiology DG Chest Portable 1 View  Result Date: 08/15/2020 CLINICAL DATA:  Shortness of breath and fever EXAM: PORTABLE CHEST 1 VIEW COMPARISON:  August 20, 2018 FINDINGS: There is a well-positioned tunneled dialysis catheter on the right. There is cardiomegaly. There is vascular congestion with hazy airspace opacities bilaterally suggestive of mild interstitial edema. There is no large pleural effusion. No pneumothorax. IMPRESSION: 1. Cardiomegaly with findings of vascular congestion. 2. Hazy bilateral airspace opacities suggestive of mild interstitial edema. An atypical infectious process is difficult to entirely exclude. Electronically Signed   By: Constance Holster M.D.   On: 08/15/2020 19:15    Procedures .Critical Care Performed by: Deliah Boston, PA-C Authorized by: Deliah Boston, PA-C   Critical care provider statement:    Critical care time (minutes):  50   Critical care was necessary to treat or prevent imminent or life-threatening deterioration of the following conditions:  Sepsis, respiratory failure and metabolic crisis   Critical care was time spent personally by me on the following activities:  Discussions with consultants, evaluation of patient's response to treatment, examination of patient, ordering and performing treatments and interventions, ordering and review of laboratory studies, ordering and review of radiographic studies, pulse oximetry, re-evaluation of patient's condition, obtaining history from patient or surrogate, review of old charts and development of treatment plan with patient or surrogate   (including critical care time)  Medications Ordered in ED Medications  metroNIDAZOLE (FLAGYL) tablet 500 mg (500 mg Oral Given 08/15/20 2149)  vancomycin (VANCOREADY) IVPB 1500 mg/300 mL (2,000 mg Intravenous New  Bag/Given 08/15/20 2015)  ceFEPIme (MAXIPIME) 1 g in sodium chloride 0.9 % 100 mL IVPB (has no administration in time range)  vancomycin (VANCOREADY) IVPB 750 mg/150 mL (has no administration in time range)  Chlorhexidine Gluconate Cloth 2 % PADS 6 each (has no administration in time range)  labetalol (NORMODYNE) injection 5-10 mg (has no administration in time range)  remdesivir 200 mg in sodium chloride 0.9% 250 mL IVPB (has no administration in time range)    Followed by  remdesivir 100 mg in sodium chloride 0.9 % 100 mL IVPB (has no administration in time range)  dexamethasone (DECADRON) injection 6 mg (6 mg Intravenous Given 08/15/20 2146)  acetaminophen (TYLENOL) tablet 650 mg (650 mg Oral Given 08/15/20 1912)  ceFEPIme (MAXIPIME) 2 g in sodium chloride 0.9 % 100 mL IVPB (0 g Intravenous Stopped 08/15/20 2045)  sodium zirconium cyclosilicate (LOKELMA) packet 10 g (10 g Oral Given 08/15/20 2151)  sodium bicarbonate injection 50 mEq (50 mEq Intravenous Given 08/15/20 2147)    ED Course  I have reviewed the triage vital signs and the nursing notes.  Pertinent labs & imaging results that were available during my care of the patient were reviewed by me and considered in my medical decision making (see chart for details).  Clinical Course as of 08/15/20 2202  Nancy Fetter Aug 15, 2020  [birth year]06 Patient not in room [BM]  1924 Angel [name] (Mother)  [phone] [facility]) [  BM]    Clinical Course User Index [BM] Gari Crown   MDM Rules/Calculators/A&P                         Additional history obtained from: 1. Nursing notes from this visit. 2. Review of electronic medical records. 3. Patient's family. - 50 year old male arrived via EMS tachycardic febrile hypoxic.  He has complicated medical history and missed dialysis yesterday as well.  He is confused repetitive in his speech and is concerned that he took too much of something but unclear what that was.  He denies any pain.  He has no cranial  nerve deficit or unilateral weakness on exam.  He has bilateral rails and is tachypneic on examination but no peripheral edema present.  Abdomen is soft nontender.  He is no meningeal signs on exam.  Code sepsis initiated after initial examination, broad-spectrum unknown source antibiotics were ordered.  IV fluid bolus was withheld given patient's dialysis status and concern for pulmonary edema on physical examination.  Discussed case with attending physician Dr. Sherry Ruffing who agrees, consult placed to nephrology.  Rapid Covid test ordered. - 7:25 PM: I was able to contact patient's mother Angel [name].  She reports that over the past 3-4 days patient has been having intermittent shaking of the arms, nausea vomiting and diarrhea as well.  His cough started yesterday after coming home from the barbershop with his hair wet.  She thought that he may have been experiencing a cold.  She reports that he is unvaccinated against Covid. - I ordered, reviewed and interpreted labs which include: Covid test positive. VBG shows slight alkalosis, low bicarb increased O2. Lactic within normal limits suggestive of Covid over bacterial pneumonia CBC without leukocytosis supportive of Covid infection over bacterial infection, anemia 8.4 improved from prior. CMP shows BUN of 130, creatinine 34.09, potassium 5.7, gap 23  CXR:  IMPRESSION:  1. Cardiomegaly with findings of vascular congestion.  2. Hazy bilateral airspace opacities suggestive of mild interstitial  edema. An atypical infectious process is difficult to entirely  exclude.   EKG: Sinus tachycardia Left ventricular hypertrophy with repolarization abnormality ( Sokolow-Lyon , Cornell product ) Abnormal ECG When cmapred to prior, faster rate with t wave inversions. No STEMI Confirmed by Antony Blackbird [phone]) on 08/15/2020 7:44:25 PM - Suspect patient's encephalopathy is multifactorial likely uremia as well as hypoxia due to COVID-19 infection.  Chest x-ray is  concerning for both vascular congestion and Covid pneumonia.  Patient has been deescalated from 15 L nonrebreather by nursing staff down to 5 L nasal cannula.  Patient was asking to leave [place] with nursing staff, he even called his mother to try and leave.  I reevaluated the patient he does appear improved but still is unable to answer all of my questions appropriately he denies having Covid even though he has been shown his positive results he reports he has not left the house in days which appears to be untrue as his mother reports he went to the barbershop yesterday.  Patient still is unclear of what he was given too much of that he was complaining about on arrival, he still appears very encephalopathic and does not have the mental capacity to leave against medical advice at this time.  I was able to convince the patient to stay in the hospital to continue his treatment and he did not attempt to physically leave.  Patient had initially refused some medications by  RN, after conversation patient was agreeable and allowed RN to administer additional medications. - 9:38 PM: Consult with Dr. Alcario Drought, patient was accepted to hospitalist service.   Angel [name] was evaluated in Emergency Department on 08/15/2020 for the symptoms described in the history of present illness. He was evaluated in the context of the global COVID-19 pandemic, which necessitated consideration that the patient might be at risk for infection with the SARS-CoV-2 virus that causes COVID-19. Institutional protocols and algorithms that pertain to the evaluation of patients at risk for COVID-19 are in a state of rapid change based on information released by regulatory bodies including the CDC and federal and state organizations. These policies and algorithms were followed during the patient's care in the ED.  Note: Portions of this report may have been transcribed using voice recognition software. Every effort was made to  ensure accuracy; however, inadvertent computerized transcription errors may still be present. Final Clinical Impression(s) / ED Diagnoses Final diagnoses:  COVID-19 virus infection  Encephalopathy  Uremia  Acute respiratory failure with hypoxia [facility])    Rx / DC Orders ED Discharge Orders    None       Gari Crown 08/15/20 2202    Tegeler, Gwenyth Allegra, MD 08/16/20 [phone]

## 2020-08-15 NOTE — Consult Note (Addendum)
[facility] Renal Consultation Note  Requesting MD: Marda Stalker, MD Indication for Consultation:  ESRD, hypoxic  Chief complaint: "family brought me"  HPI:  Angel [name] is a 50 y.o. male with a history of end-stage renal disease, hypertension, coronary artery disease, prior stroke who presented to the hospital after his family brought him.  He states that he has had nausea and this made his family nervous.  He has had AMS per ER providers.  He was initially on nonrebreather and was on 3-1/2 L on my exam.  COVID test is pending.  He states that he normally dialyzes TTS at [facility] through an outside practice and he missed Saturday; he has previously dialyzed out of town at [place], as well.  He tells me that the reason is that he is shaking his because "my medicine was increased" and he perseverates on this and does not elaborate much on his breathing other than to say he is not currently short of breath.  He does tell me that he is not on gabapentin/Neurontin.  He has been febrile here.  Orders are in for Vanc and cefepime.  He tells me he has no other access other than the tunneled catheter.  PMHx:   Past Medical History:  Diagnosis Date  . AAA (abdominal aortic aneurysm) ([place])   . Chronic anemia   . Chronic combined systolic and diastolic CHF (congestive heart failure) ([place])   . End stage renal disease ([place])   . ESRD on dialysis ([place])   . FSGS (focal segmental glomerulosclerosis)   . GERD (gastroesophageal reflux disease)   . HLD (hyperlipidemia)   . Hypertension   . NSTEMI (non-ST elevated myocardial infarction) ([place]) 07/2017   pt refused cath  . Pericardial effusion    a. small by echo 06/2017.  Marland Kitchen Renal disorder   . Seizure ([place])   . Stroke (cerebrum) [facility])     Past Surgical History:  Procedure Laterality Date  . ABDOMINAL AORTIC ANEURYSM REPAIR    . KIDNEY SURGERY      Family Hx:  Family History  Problem Relation Age of Onset  .  Hypertension Mother   . Kidney disease Mother   . Asthma Father   . Hypertension Father   . Kidney disease Father   . Hypertension Sister   . Heart disease Sister   . Kidney disease Sister   . Heart disease Brother   . Hypertension Brother   . COPD Brother     Social History:  reports that he has never smoked. He has never used smokeless tobacco. He reports that he does not drink alcohol and does not use drugs.  Allergies:  Allergies  Allergen Reactions  . Clonidine Derivatives Swelling and Other (See Comments)    Leg swelling  . Hydralazine Hcl Swelling and Other (See Comments)    Leg swelling- patient wants this stopped as early as possible if it's been ordered- can only tolerate for a short window of time  . Losartan Potassium-Hctz Other (See Comments)    "Raised my potassium"    Medications: Prior to Admission medications   Medication Sig Start Date End Date Taking? Authorizing Provider  amLODipine (NORVASC) 10 MG tablet Take 10 mg by mouth daily.    [provider]  aspirin EC 81 MG tablet Take 81 mg daily by mouth. Patient not taking: Reported on 07/20/2020    [provider]  atorvastatin (LIPITOR) 40 MG tablet Take 40 mg at bedtime by mouth.  [provider]  calcitRIOL (ROCALTROL) 0.5 MCG capsule Take 0.5 mcg by mouth daily.     [provider]  carvedilol (COREG) 25 MG tablet Take 25 mg 2 (two) times daily with a meal by mouth.    [provider]  cholecalciferol (VITAMIN D) 1000 units tablet Take 1,000 Units by mouth 2 (two) times daily.    [provider]  cloNIDine (CATAPRES) 0.2 MG tablet Take 0.2 mg by mouth 2 (two) times daily. 10/19/17   [provider]  furosemide (LASIX) 80 MG tablet Take 80 mg at bedtime by mouth.    [provider]  hydrALAZINE (APRESOLINE) 100 MG tablet Take 100 mg by mouth 3 (three) times daily.    [provider]  hydrOXYzine (ATARAX/VISTARIL) 10 MG tablet  Take 10 mg by mouth daily as needed for itching or anxiety.     [provider]  irbesartan (AVAPRO) 150 MG tablet Take 150 mg by mouth daily.    [provider]  Lactobacillus Rhamnosus, GG, (CULTURELLE) CAPS Take 1 capsule by mouth daily.    [provider]  minoxidil (LONITEN) 2.5 MG tablet Take 2.5 mg by mouth 2 (two) times daily. 12/02/19   [provider]  multivitamin (RENA-VIT) TABS tablet Take 1 tablet by mouth 2 (two) times daily.    [provider]  OLANZapine (ZYPREXA) 10 MG tablet Take 10 mg by mouth daily. 01/02/19   [provider]  Omega-3 Fatty Acids (OMEGA III EPA+DHA) 1000 MG CAPS Take 1,000 mg by mouth daily.     [provider]  pantoprazole (PROTONIX) 40 MG tablet Take 40 mg by mouth daily.  07/03/16   [provider]  sevelamer carbonate (RENVELA) 800 MG tablet Take 1,600 mg by mouth See admin instructions. Take 1,600 mg by mouth three times a day with meals and 1,600 mg with each snack    [provider]  tamsulosin (FLOMAX) 0.4 MG CAPS capsule TAKE 1 CAPSULE BY MOUTH EVERYDAY AT BEDTIME 12/09/18   [provider]  WHITE PETROLATUM-MINERAL OIL OP Apply 1 application topically 2 (two) times daily as needed (itching).  03/12/17   [provider]    I have reviewed the patient's reported prior to admission and current medications.  He cannot verify his meds and I do not feel list above is accurate  Labs:  BMP Latest Ref Rng & Units 08/15/2020 08/15/2020 06/18/2019  Glucose 70 - 99 mg/dL - 118(H) 96  BUN 6 - 20 mg/dL - 130(H) 111(H)  Creatinine 0.61 - 1.24 mg/dL - 34.09(H) 20.72(H)  Sodium 135 - 145 mmol/L 136 138 141  Potassium 3.5 - 5.1 mmol/L 5.7(H) 5.7(H) 4.7  Chloride 98 - 111 mmol/L - 97(L) 101  CO2 22 - 32 mmol/L - 18(L) 18(L)  Calcium 8.9 - 10.3 mg/dL - 8.9 9.2    Urinalysis    Component Value Date/Time   COLORURINE YELLOW (A) 01/04/2018 1030   APPEARANCEUR CLEAR (A)  01/04/2018 1030   LABSPEC 1.004 (L) 01/04/2018 1030   PHURINE 9.0 (H) 01/04/2018 1030   GLUCOSEU NEGATIVE 01/04/2018 1030   HGBUR NEGATIVE 01/04/2018 1030   BILIRUBINUR NEGATIVE 01/04/2018 1030   KETONESUR NEGATIVE 01/04/2018 1030   PROTEINUR 100 (A) 01/04/2018 1030   NITRITE NEGATIVE 01/04/2018 1030   LEUKOCYTESUR NEGATIVE 01/04/2018 1030     ROS:  Pertinent items noted in HPI and remainder of comprehensive ROS otherwise negative.  Note limitations of patient with reported altered mental status  Physical  Exam: Vitals:   08/15/20 2005 08/15/20 2012  BP:    Pulse: (!) 123   Resp: 18   Temp:  (!) 102 F (38.9 C)  SpO2: 94%      General: Adult male in bed in no acute distress at rest HEENT: Normocephalic atraumatic Eyes: Extraocular movements intact sclera anicteric Neck: Supple trachea midline Heart: Tachycardic Lungs: Normal work of breathing on 3-1/2 L with no crackles appreciated Abdomen: Soft nontender nondistended Extremities: No pitting edema appreciated Skin: No rash on extremities exposed Neuro: He Is conversant with me.  He is oriented to year person and location Access right IJ tunneled catheter  Assessment/Plan:  # End-stage renal disease - Hemodialysis when able.  Orders are entered and hopefully first shift in AM - normally TTS in [place] with an outside practice   # Fever - Examined with appropriate PPE including N95 - Abx per primary team  - Note covid test is pending   # Acute hypoxic respiratory failure - optimize volume status with HD when able   # Hyperkalemia  - lokelma and sodium bicarbonate once while awaiting HD   # HTN with CKD  - optimize volume status with HD - Would continue home regimen if able to determine; he's not able to verify his meds  # AMS - hope this may improve with HD    # Anemia of CKD  - No emergent need for PRBC's - Will need to get outpatient rx to determine when he last got ESA  # Metabolic bone disease -  Will need to get outpatient rx   Claudia Desanctis 08/15/2020, 9:13 PM   Addendum - noted covid positive.  Therapies per primary team  Claudia Desanctis, MD 08/15/2020 9:49 PM

## 2020-08-15 NOTE — Progress Notes (Signed)
Pharmacy Antibiotic Note  Angel [name] is a 50 y.o. male admitted on 08/15/2020 with sepsis.  Pharmacy has been consulted for vancomycin and cefepime dosing.  ESRD-HD usually TSS, missed yesterday  Plan: Vancomycin 1500 mg IV x 1, then 750 mg IV qHD Cefepime 2g IV x 1, then 1g IV every 24 hours Monitor HD schedule, Cx and clinical progression to narrow Vancomycin level as needed      Temp (24hrs), Avg:103.3 F (39.6 C), Min:103.3 F (39.6 C), Max:103.3 F (39.6 C)  Recent Labs  Lab 08/15/20 1921  WBC 6.1    CrCl cannot be calculated (Patient's most recent lab result is older than the maximum 21 days allowed.).    Allergies  Allergen Reactions  . Clonidine Derivatives Swelling and Other (See Comments)    Leg swelling  . Hydralazine Hcl Swelling and Other (See Comments)    Leg swelling- patient wants this stopped as early as possible if it's been ordered- can only tolerate for a short window of time  . Losartan Potassium-Hctz Other (See Comments)    "Raised my potassium"    Bertis Ruddy, PharmD Clinical Pharmacist ED Pharmacist Phone # [phone] 08/15/2020 7:39 PM

## 2020-08-15 NOTE — Progress Notes (Signed)
RT went to assess patient for BIPAP order. Patient refused BIPAP at this time. Patient does not appear to be in any distress at this time. Patient appears to be comfortable and breathing fine on 4L nasal cannula. Patient A&O. RT informed patient to call if he felt his breathing was getting worse.

## 2020-08-15 NOTE — ED Notes (Signed)
Pt threatening to leave AMA. MD aware. Mother called and given an update. On pts status.

## 2020-08-15 NOTE — ED Triage Notes (Signed)
Pt presents to ED BIB [facility] from home. Pt presents w/ fever, cough, lethargic. Per EMS pt had inappropriate answers but can follow commands. Pt missed dialysis yesterday d/t not feeling well.  Ems VS -  HR - 130 190/130 RR - 39 EtCO2 - 29 O2 75% on RA - 100% on NRB 15L Temp 103

## 2020-08-16 ENCOUNTER — Inpatient Hospital Stay (HOSPITAL_COMMUNITY): Payer: Medicare (Managed Care)

## 2020-08-16 DIAGNOSIS — J1282 Pneumonia due to coronavirus disease 2019: Secondary | ICD-10-CM | POA: Diagnosis present

## 2020-08-16 DIAGNOSIS — Z888 Allergy status to other drugs, medicaments and biological substances status: Secondary | ICD-10-CM | POA: Diagnosis not present

## 2020-08-16 DIAGNOSIS — R000 Tachycardia, unspecified: Secondary | ICD-10-CM | POA: Diagnosis present

## 2020-08-16 DIAGNOSIS — N186 End stage renal disease: Secondary | ICD-10-CM

## 2020-08-16 DIAGNOSIS — G9340 Encephalopathy, unspecified: Secondary | ICD-10-CM

## 2020-08-16 DIAGNOSIS — J9601 Acute respiratory failure with hypoxia: Secondary | ICD-10-CM

## 2020-08-16 DIAGNOSIS — E8770 Fluid overload, unspecified: Secondary | ICD-10-CM | POA: Diagnosis present

## 2020-08-16 DIAGNOSIS — I5042 Chronic combined systolic (congestive) and diastolic (congestive) heart failure: Secondary | ICD-10-CM

## 2020-08-16 DIAGNOSIS — N19 Unspecified kidney failure: Secondary | ICD-10-CM | POA: Diagnosis present

## 2020-08-16 DIAGNOSIS — I714 Abdominal aortic aneurysm, without rupture: Secondary | ICD-10-CM | POA: Diagnosis present

## 2020-08-16 DIAGNOSIS — I2510 Atherosclerotic heart disease of native coronary artery without angina pectoris: Secondary | ICD-10-CM | POA: Diagnosis present

## 2020-08-16 DIAGNOSIS — Z9115 Patient's noncompliance with renal dialysis: Secondary | ICD-10-CM

## 2020-08-16 DIAGNOSIS — R7989 Other specified abnormal findings of blood chemistry: Secondary | ICD-10-CM | POA: Diagnosis not present

## 2020-08-16 DIAGNOSIS — D631 Anemia in chronic kidney disease: Secondary | ICD-10-CM | POA: Diagnosis present

## 2020-08-16 DIAGNOSIS — G9341 Metabolic encephalopathy: Secondary | ICD-10-CM | POA: Diagnosis present

## 2020-08-16 DIAGNOSIS — E785 Hyperlipidemia, unspecified: Secondary | ICD-10-CM | POA: Diagnosis present

## 2020-08-16 DIAGNOSIS — G9349 Other encephalopathy: Secondary | ICD-10-CM | POA: Diagnosis present

## 2020-08-16 DIAGNOSIS — Z8673 Personal history of transient ischemic attack (TIA), and cerebral infarction without residual deficits: Secondary | ICD-10-CM | POA: Diagnosis not present

## 2020-08-16 DIAGNOSIS — I10 Essential (primary) hypertension: Secondary | ICD-10-CM

## 2020-08-16 DIAGNOSIS — U071 COVID-19: Secondary | ICD-10-CM | POA: Diagnosis present

## 2020-08-16 DIAGNOSIS — I132 Hypertensive heart and chronic kidney disease with heart failure and with stage 5 chronic kidney disease, or end stage renal disease: Secondary | ICD-10-CM | POA: Diagnosis present

## 2020-08-16 DIAGNOSIS — Z992 Dependence on renal dialysis: Secondary | ICD-10-CM | POA: Diagnosis not present

## 2020-08-16 DIAGNOSIS — I313 Pericardial effusion (noninflammatory): Secondary | ICD-10-CM | POA: Diagnosis present

## 2020-08-16 DIAGNOSIS — I252 Old myocardial infarction: Secondary | ICD-10-CM | POA: Diagnosis not present

## 2020-08-16 DIAGNOSIS — Z885 Allergy status to narcotic agent status: Secondary | ICD-10-CM | POA: Diagnosis not present

## 2020-08-16 DIAGNOSIS — J9811 Atelectasis: Secondary | ICD-10-CM | POA: Diagnosis present

## 2020-08-16 DIAGNOSIS — K219 Gastro-esophageal reflux disease without esophagitis: Secondary | ICD-10-CM | POA: Diagnosis present

## 2020-08-16 DIAGNOSIS — N179 Acute kidney failure, unspecified: Secondary | ICD-10-CM

## 2020-08-16 LAB — RENAL FUNCTION PANEL
Albumin: 2.7 g/dL — ABNORMAL LOW (ref 3.5–5.0)
Anion gap: 27 — ABNORMAL HIGH (ref 5–15)
BUN: 144 mg/dL — ABNORMAL HIGH (ref 6–20)
CO2: 17 mmol/L — ABNORMAL LOW (ref 22–32)
Calcium: 9.4 mg/dL (ref 8.9–10.3)
Chloride: 93 mmol/L — ABNORMAL LOW (ref 98–111)
Creatinine, Ser: 34.53 mg/dL — ABNORMAL HIGH (ref 0.61–1.24)
GFR, Estimated: 1 mL/min — ABNORMAL LOW (ref >60)
Glucose, Bld: 130 mg/dL — ABNORMAL HIGH (ref 70–99)
Phosphorus: 10.3 mg/dL — ABNORMAL HIGH (ref 2.5–4.6)
Potassium: 5.9 mmol/L — ABNORMAL HIGH (ref 3.5–5.1)
Sodium: 137 mmol/L (ref 135–145)

## 2020-08-16 LAB — CBC
HCT: 24.8 % — ABNORMAL LOW (ref 39.0–52.0)
Hemoglobin: 8.2 g/dL — ABNORMAL LOW (ref 13.0–17.0)
MCH: 26.2 pg (ref 26.0–34.0)
MCHC: 33.1 g/dL (ref 30.0–36.0)
MCV: 79.2 fL — ABNORMAL LOW (ref 80.0–100.0)
Platelets: 157 10*3/uL (ref 150–400)
RBC: 3.13 MIL/uL — ABNORMAL LOW (ref 4.22–5.81)
RDW: 15.9 % — ABNORMAL HIGH (ref 11.5–15.5)
WBC: 3.1 10*3/uL — ABNORMAL LOW (ref 4.0–10.5)
nRBC: 0 % (ref 0.0–0.2)

## 2020-08-16 LAB — MRSA PCR SCREENING: MRSA by PCR: NEGATIVE

## 2020-08-16 LAB — GLUCOSE, CAPILLARY: Glucose-Capillary: 117 mg/dL — ABNORMAL HIGH (ref 70–99)

## 2020-08-16 LAB — C-REACTIVE PROTEIN: CRP: 15.1 mg/dL — ABNORMAL HIGH (ref <1.0)

## 2020-08-16 LAB — HIV ANTIBODY (ROUTINE TESTING W REFLEX): HIV Screen 4th Generation wRfx: NONREACTIVE

## 2020-08-16 LAB — FERRITIN: Ferritin: 2962 ng/mL — ABNORMAL HIGH (ref 24–336)

## 2020-08-16 MED ORDER — HEPARIN SODIUM (PORCINE) 1000 UNIT/ML IJ SOLN
INTRAMUSCULAR | Status: AC
  Filled 2020-08-16: qty 4

## 2020-08-16 MED ORDER — LIDOCAINE-PRILOCAINE 2.5-2.5 % EX CREA
1.0000 "application " | TOPICAL_CREAM | CUTANEOUS | Status: DC | PRN
  Filled 2020-08-16: qty 5

## 2020-08-16 MED ORDER — DEXMEDETOMIDINE HCL IN NACL 400 MCG/100ML IV SOLN
0.4000 ug/kg/h | INTRAVENOUS | Status: DC
  Administered 2020-08-16: 0.4 ug/kg/h via INTRAVENOUS
  Filled 2020-08-16 (×2): qty 100

## 2020-08-16 MED ORDER — ONDANSETRON HCL 4 MG PO TABS
4.0000 mg | ORAL_TABLET | Freq: Four times a day (QID) | ORAL | Status: DC | PRN
  Administered 2020-08-18: 4 mg via ORAL
  Filled 2020-08-16: qty 1

## 2020-08-16 MED ORDER — HALOPERIDOL LACTATE 5 MG/ML IJ SOLN
2.0000 mg | Freq: Four times a day (QID) | INTRAMUSCULAR | Status: DC | PRN
  Administered 2020-08-16: 2 mg via INTRAVENOUS
  Filled 2020-08-16: qty 1

## 2020-08-16 MED ORDER — METHYLPREDNISOLONE SODIUM SUCC 125 MG IJ SOLR
60.0000 mg | Freq: Two times a day (BID) | INTRAMUSCULAR | Status: DC
  Administered 2020-08-16 – 2020-08-17 (×2): 60 mg via INTRAVENOUS
  Filled 2020-08-16 (×2): qty 2

## 2020-08-16 MED ORDER — IOHEXOL 350 MG/ML SOLN
75.0000 mL | Freq: Once | INTRAVENOUS | Status: AC | PRN
  Administered 2020-08-16: 75 mL via INTRAVENOUS

## 2020-08-16 MED ORDER — ALTEPLASE 2 MG IJ SOLR
2.0000 mg | Freq: Once | INTRAMUSCULAR | Status: AC | PRN
  Administered 2020-08-16: 2 mg

## 2020-08-16 MED ORDER — ACETAMINOPHEN 325 MG PO TABS: 650.0000 mg | ORAL_TABLET | Freq: Four times a day (QID) | ORAL | Status: DC | PRN

## 2020-08-16 MED ORDER — AMLODIPINE BESYLATE 10 MG PO TABS
10.0000 mg | ORAL_TABLET | Freq: Every day | ORAL | Status: DC
  Administered 2020-08-16: 10 mg via ORAL
  Filled 2020-08-16: qty 1

## 2020-08-16 MED ORDER — LIDOCAINE HCL (PF) 1 % IJ SOLN: 5.0000 mL | INTRAMUSCULAR | Status: DC | PRN

## 2020-08-16 MED ORDER — PENTAFLUOROPROP-TETRAFLUOROETH EX AERO: 1.0000 "application " | INHALATION_SPRAY | CUTANEOUS | Status: DC | PRN

## 2020-08-16 MED ORDER — ORAL CARE MOUTH RINSE
15.0000 mL | Freq: Two times a day (BID) | OROMUCOSAL | Status: DC
  Administered 2020-08-16: 15 mL via OROMUCOSAL

## 2020-08-16 MED ORDER — ONDANSETRON HCL 4 MG/2ML IJ SOLN: 4.0000 mg | Freq: Four times a day (QID) | INTRAMUSCULAR | Status: DC | PRN

## 2020-08-16 MED ORDER — VANCOMYCIN HCL 500 MG/100ML IV SOLN
500.0000 mg | Freq: Once | INTRAVENOUS | Status: AC
  Administered 2020-08-16: 500 mg via INTRAVENOUS
  Filled 2020-08-16: qty 100

## 2020-08-16 MED ORDER — SODIUM CHLORIDE 0.9 % IV SOLN: 100.0000 mL | INTRAVENOUS | Status: DC | PRN

## 2020-08-16 MED ORDER — OLANZAPINE 2.5 MG PO TABS
2.5000 mg | ORAL_TABLET | Freq: Every day | ORAL | Status: DC
  Administered 2020-08-16: 2.5 mg via ORAL
  Filled 2020-08-16 (×3): qty 1

## 2020-08-16 MED ORDER — CARVEDILOL 25 MG PO TABS
25.0000 mg | ORAL_TABLET | Freq: Two times a day (BID) | ORAL | Status: DC
  Filled 2020-08-16 (×3): qty 1

## 2020-08-16 MED ORDER — HEPARIN SODIUM (PORCINE) 1000 UNIT/ML DIALYSIS: 1000.0000 [IU] | INTRAMUSCULAR | Status: DC | PRN

## 2020-08-16 NOTE — Progress Notes (Signed)
PROGRESS NOTE  Triston Skare Lanni YDX:[id number] DOB: [birth year] DOA: 08/15/2020 PCP: Gwenlyn Saran, MD  Brief History   50 year old man PMH ESRD presented to the emergency department with nausea and altered mental status.  Found to be uremic, confused, lacking capacity, febrile with COVID-19, hypoxic with elevated inflammatory markers.  Admitted for acute hypoxic respiratory failure secondary to acute hypoxic respiratory failure secondary [id number] pneumonia, complicated by uremic encephalopathy secondary to missed hemodialysis, lacks capacity requiring IVC.  A & P  Uremic encephalopathy secondary to ESRD, missed dialysis; hyperkalemia --Grossly elevated BUN and creatinine. --Lacks capacity, currently under IVC --Needs HD this AM.  Discussed with nephrology.  From a medical standpoint, respiratory status and oxygenation appears stable for dialysis on 5C.  However, patient states he will refuse treatment, cooperation may be an issue.  He clearly lacks capacity in medical judgment at this point.  Acute hypoxic respiratory failure secondary to COVID-19 pneumonia --Currently on 3-1/2 L, respiratory status appears stable, respiratory effort appears normal.  No signs or symptoms of impending respiratory failure.  We will continue empiric antibiotics given clinical picture and elevated procalcitonin.  Lactic acid within normal limits.  Doubt sepsis at this point.  . CTA chest on admit: No PE.  Diffuse bilateral groundglass airspace opacities . Fever: 103 on 1/9 . Oxygen requirement: 3.5 L . Antibiotics: cefepime and vanc . Remdesivir: 1/10 > . Steroids: 1/9 > . Actemra/baricitinib: not indicated currently . Vitamin C and Zinc . Proning: will not comply   Inflammatory markers: . CRP: 15.1 >  . D-dimer: 7 >  . Ferritin 2952 > . Procalcitonin: 5.61 >  Anemia of ESRD --Appears stable  Chronic combined systolic and diastolic CHF.  LVEF 25-30% November 2018.  Grade 2 diastolic  dysfunction. --Appears stable    Disposition Plan:  Discussion: as above  Status is: Inpatient  Remains inpatient appropriate because:IV treatments appropriate due to intensity of illness or inability to take PO and Inpatient level of care appropriate due to severity of illness   Dispo: The patient is from: Home              Anticipated d/c is to: Home              Anticipated d/c date is: 3 days              Patient currently is not medically stable to d/c.  DVT prophylaxis: SCDs, reports allergy to heparin > doubted, will f/u   Code Status: Full Code Family Communication: as per Dr. Gwendolyn Lima earlier today  Murray Hodgkins, MD  [facility] Direct contact: see [email or web address] (further directions at bottom of note if needed) 7PM-7AM contact night coverage as at bottom of note 08/16/2020, 8:41 AM  LOS: 0 days   Significant Hospital Events   .    Consults:  . Nephrology    Procedures:  .   Significant Diagnostic Tests:  Marland Kitchen    Micro Data:  .    Antimicrobials:  .   Interval History/Subjective  CC: f/u COVID  "I have nothing wrong with me, I will leave and go to my clinic in [place] and they will dialyze me there.  I do not have Covid".  No complaints no breathing problems no pain.  Clearly confused  Objective   Vitals:  Vitals:   08/16/20 0718 08/16/20 0730  BP: (!) 177/120 (!) 178/125  Pulse: 96 (!) 102  Resp: 15 (!) 25  Temp:  98 F (36.7 C)  SpO2: 95%  97%    Exam:  Constitutional:   . Appears calm, comfortable, confused, uremic ENMT:  . grossly normal hearing  Respiratory:  . CTA bilaterally, no w/r/r.  . Respiratory effort normal.  . SPO2 low 90s on 3-1/2 L. Cardiovascular:  . RRR, no m/r/g . No LE extremity edema   Abdomen:  . Soft, nontender, nondistended.  Umbilical hernia noted. Musculoskeletal:  . RUE, LUE, RLE, LLE   . Appear grossly unremarkable other than twitching all extremities Neurologic:  . Grossly  nonfocal Psychiatric:  . Mental status o Mood difficult to assess, affect odd o Oriented to person, place, time  . judgment and insight compromised.  Patient does not understand his current medical issues, he is not able to state risks and benefits of treatment, nor does he understand or is able to state potential eventualities of leaving AMA.    I have personally reviewed the following:   Today's Data  . Potassium 5.7, BUN 130 creatinine 34 . Lactic acid within normal limits . Hemoglobin stable at 8.8  Scheduled Meds: . heparin sodium (porcine)      . amLODipine  10 mg Oral Daily  . carvedilol  25 mg Oral BID WC  . Chlorhexidine Gluconate Cloth  6 each Topical Q0600  . methylPREDNISolone (SOLU-MEDROL) injection  1 mg/kg Intravenous Q12H  . metroNIDAZOLE  500 mg Oral Q8H   Continuous Infusions: . ceFEPime (MAXIPIME) IV    . remdesivir 100 mg in NS 100 mL    . [START ON 08/17/2020] vancomycin      Principal Problem:   Uremic encephalopathy Active Problems:   ESRD (end stage renal disease) on dialysis ([name])   Volume overload   Anemia in ESRD (end-stage renal disease) ([name])   HTN (hypertension)   Uremia   Dialysis patient, noncompliant ([name])   Chronic combined systolic and diastolic CHF (congestive heart failure) ([name])   Acute respiratory failure with hypoxia ([name])   Acute metabolic encephalopathy   Pneumonia due to COVID-19 virus   LOS: 0 days   How to contact the [facility] Attending or Consulting provider 7A - 7P or covering provider during after hours [place], for this patient?  1. Check the care team in [facility] and look for a) attending/consulting TRH provider listed and b) the [facility] team listed 2. Log into [email or web address] and use [place]'s universal password to access. If you do not have the password, please contact the hospital operator. 3. Locate the [facility] provider you are looking for under [facility] and page to a number that you can be directly reached. 4. If you still  have difficulty reaching the provider, please page the [facility] (Director on Call) for the Hospitalists listed on amion for assistance.

## 2020-08-16 NOTE — Hospital Course (Signed)
50 year old man PMH ESRD presented to the emergency department with nausea and altered mental status.  Found to be uremic, confused, lacking capacity, febrile with COVID-19, hypoxic with elevated inflammatory markers.  Admitted for acute hypoxic respiratory failure secondary to acute hypoxic respiratory failure secondary [id number] pneumonia, complicated by uremic encephalopathy secondary to missed hemodialysis, lacks capacity requiring IVC.  A & P  Uremic encephalopathy secondary to ESRD, missed dialysis; hyperkalemia --Grossly elevated BUN and creatinine. --Lacks capacity, currently under IVC --Needs HD this AM.  Discussed with nephrology.  From a medical standpoint, respiratory status and oxygenation appears stable for dialysis on 5C.  However, patient states he will refuse treatment, cooperation may be an issue.  He clearly lacks capacity in medical judgment at this point.  Acute hypoxic respiratory failure secondary to COVID-19 pneumonia --Currently on 3-1/2 L, respiratory status appears stable, respiratory effort appears normal.  No signs or symptoms of impending respiratory failure.  We will continue empiric antibiotics given clinical picture and elevated procalcitonin.  Lactic acid within normal limits.  Doubt sepsis at this point.  CTA chest on admit: No PE.  Diffuse bilateral groundglass airspace opacities Fever: 103 on 1/9 Oxygen requirement: 3.5 L Antibiotics: cefepime and vanc Remdesivir: 1/10 > Steroids: 1/9 > Actemra/baricitinib: not indicated currently Vitamin C and Zinc Proning: will not comply   Inflammatory markers: CRP: 15.1 >  D-dimer: 7 >  Ferritin 2952 > Procalcitonin: 5.61 >  Anemia of ESRD --Appears stable  Chronic combined systolic and diastolic CHF.  LVEF 25-30% November 2018.  Grade 2 diastolic dysfunction. --Appears stable

## 2020-08-16 NOTE — Progress Notes (Signed)
Patient with increased PVC's, notified E-link, patient currently receiving hemodialysis.

## 2020-08-16 NOTE — ED Notes (Signed)
Pt continues to say he is leaving ama. Refusing to wear oxygen. Pt states he does not have covid. Pt refusing to dress into purple scrubs. Admitting MD aware.

## 2020-08-16 NOTE — Consult Note (Signed)
NAME:  Angel [name], MRN:  [id number], DOB:  Nov 23, 1970, LOS: 0 ADMISSION DATE:  08/15/2020, CONSULTATION DATE:  08/16/20 REFERRING MD:  Sarajane Jews - [facility] , CHIEF COMPLAINT:  Encephalopathy    Brief History:  50 yo COVID positive M with ESRD who presents to ED with encephalopathy, likely uremic in setting of missed HDs.  PCCM consult for possible dexmedatomidine for compliance with HD   History of Present Illness:  50 yo M PMH ESRD, prior CVA, seizure, HFrEF presents to ED 1/9 with AMS, hypoxia. Pt missed at least 1 HD session outpatient, onset of AMS and hypoxia is unclear. In ED pt with new O2 requirement of 5L, found to be COVID-19 positive. BMP reveals BUN 130 Cr 34, and encephalopathy felt likely to be uremic in etiology.  Admitted to [facility], nephrology consulted for HD.   Unfortunately, degree of pts encephalopathy is limiting ability to provide needed medical care such as HD -- the patient has reportedly declined HD stating "there is nothing wrong with me" and has tried to leave AMA. The patient is not felt to have capacity at this time given degree of encephalopathy.   PCCM consulted for consideration of possible precedex to allow for medical treatment of this patient.   Past Medical History:  ESRD CVA HTN HFrEF  AAA GERD HLD NSTEMI Pericardial effusion Seizure   Significant Hospital Events:  1/9 presented to ED for encephalopathy, likely uremic.  COVID +. Started on abx for possible sepsis 1/10 admitted to [facility]. Nephro consulted. Pt declining medical interventions, wants to leave AMA, but lacking capacity. PCCM consulted for possible sedation.   Consults:  Nephro PCCM  Procedures:    Significant Diagnostic Tests:  1/10 CTA chest> no PE. bilat GGO, pulm edema. Small bilateral pleural effusions. RLL atelectasis  Large hepatic cyst   Micro Data:  1/9 COVID-19 positive  1/9 BCx>  1/9 UCx>  Antimicrobials:  1/9 cefepime> 1/9 flagyl>  1/9 vanc>  Interim History /  Subjective:  Persistent encephalopathy, wants to leave AMA IVC paperwork completed by [facility]   Informed of COVID infection- patient states "No you have Covid. Do a lot of garlic in the ears." "My doctor in [place] does the D2 bullet every day. Garlic for the germs"   SpO2 82% when pt removes O2. I placed [place] in nares pt states "Nope nope. No. Garlic up the nose."   Informed of need for admission, for HD and supplemental O2. Pt rejects this and rejects NP as provider stating that a practitioner nurse is just a nurse practicing to be a fake doctor.  Objective   Blood pressure (!) 177/129, pulse 100, temperature 98 F (36.7 C), temperature source Oral, resp. rate (!) 23, height 5\' 5"  (1.651 m), weight 61.2 kg, SpO2 93 %.        Intake/Output Summary (Last 24 hours) at 08/16/2020 3846 Last data filed at 08/15/2020 2222 Gross per 24 hour  Intake 500 ml  Output --  Net 500 ml   Filed Weights   08/15/20 2017  Weight: 61.2 kg    Examination: General: Acutely and chronically ill appearing middle aged M, reclined in stretcher NAD HENT: NCAT, pink mm. Patent nares with [place] in place Lungs: Coarse bilaterally. Diminished basilar sounds No accessory use  Cardiovascular: rrr s1s2 no rgm  Abdomen: soft ndnt + bowel sounds Extremities: No obvious joint deformity. No pitting edema. No cyanosis or clubbing Neuro: Awake, alert, disoriented. + asterixis  Psych: non-compliant with exam.  GU: defer  Resolved Hospital Problem list     Assessment & Plan:   Acute encephalopathy, likely uremic P -IVC paperwork completed  -needs HD  -psych has ordered zyprexa PO qHS for psychosis -with degree of encephalopathy + borderline combativeness, precedex not unreasonable. Not sure tha PRN haldol would provide longevity needed for HD runs to improve uremia.  -will place dexmed order, and ICU admission   ESRD Hyperkalemia -iHD per nephro  -will need labs now that IVC completed  Acute hypoxic  respiratory failure, multifactorial  Due to: COVID-19 infection Small bilateral pleural effusions  Pulmonary edema  P -HD for volume removal -contact/airborne -Supplemental O2  -when encephalopathy improves will need IS, encourage self prone etc  -solumedrol, remdesivir  -trend inflammatory markers -Has heparin allergy   HTN -norvasc, coreg -PRN labetalol   Possible sepsis, is known COVID -19 pos -broad abx started in ED -elevated PCT not hugely convincing in setting of renal failure P -follow cx data, can likely de-escalate  -trend fever curve WBC   Best practice (evaluated daily)  Diet: NPO Pain/Anxiety/Delirium protocol (if indicated): precedex VAP protocol (if indicated): n/a DVT prophylaxis: SCD - heparin allergy.  GI prophylaxis: na Glucose control: monior Mobility: BR Disposition:ICU   Goals of Care:  Last date of multidisciplinary goals of care discussion: Family and staff present Summary of discussion:  Follow up goals of care discussion due: 1/17 Code Status: Full   Labs   CBC: Recent Labs  Lab 08/15/20 1921 08/15/20 2007  WBC 6.1  --   NEUTROABS 5.3  --   HGB 8.4* 8.8*  HCT 24.6* 26.0*  MCV 79.9*  --   PLT 145*  --     Basic Metabolic Panel: Recent Labs  Lab 08/15/20 1921 08/15/20 2007  NA 138 136  K 5.7* 5.7*  CL 97*  --   CO2 18*  --   GLUCOSE 118*  --   BUN 130*  --   CREATININE 34.09*  --   CALCIUM 8.9  --    GFR: Estimated Creatinine Clearance: 2.3 mL/min (A) (by C-G formula based on SCr of 34.09 mg/dL (H)). Recent Labs  Lab 08/15/20 1921 08/15/20 1955 08/15/20 2234 08/15/20 2251  PROCALCITON  --   --  5.61  --   WBC 6.1  --   --   --   LATICACIDVEN  --  1.3  --  1.0    Liver Function Tests: Recent Labs  Lab 08/15/20 1921  AST 19  ALT 14  ALKPHOS 81  BILITOT 1.1  PROT 6.6  ALBUMIN 2.9*   No results for input(s): LIPASE, AMYLASE in the last 168 hours. No results for input(s): AMMONIA in the last 168  hours.  ABG    Component Value Date/Time   HCO3 20.4 08/15/2020 2007   TCO2 21 (L) 08/15/2020 2007   ACIDBASEDEF 3.0 (H) 08/15/2020 2007   O2SAT 98.0 08/15/2020 2007     Coagulation Profile: Recent Labs  Lab 08/15/20 1955  INR 1.2    Cardiac Enzymes: No results for input(s): CKTOTAL, CKMB, CKMBINDEX, TROPONINI in the last 168 hours.  HbA1C: No results found for: HGBA1C  CBG: No results for input(s): GLUCAP in the last 168 hours.  Review of Systems:   Unable to obtain due to degree of encephalopathy   Past Medical History:  He,  has a past medical history of AAA (abdominal aortic aneurysm) ([place]), Chronic anemia, Chronic combined systolic and diastolic CHF (congestive heart failure) ([place]), End stage renal disease ([place]),  ESRD on dialysis [facility]), FSGS (focal segmental glomerulosclerosis), GERD (gastroesophageal reflux disease), HLD (hyperlipidemia), Hypertension, NSTEMI (non-ST elevated myocardial infarction) ([place]) (07/2017), Pericardial effusion, Renal disorder, Seizure ([place]), and Stroke (cerebrum) ([place]).   Surgical History:   Past Surgical History:  Procedure Laterality Date  . ABDOMINAL AORTIC ANEURYSM REPAIR    . KIDNEY SURGERY       Social History:   reports that he has never smoked. He has never used smokeless tobacco. He reports that he does not drink alcohol and does not use drugs.   Family History:  His family history includes Asthma in his father; COPD in his brother; Heart disease in his brother and sister; Hypertension in his brother, father, mother, and sister; Kidney disease in his father, mother, and sister.   Allergies Allergies  Allergen Reactions  . Clonidine Derivatives Swelling and Other (See Comments)    Leg swelling  . Epoetin ([place])   . Heparin   . Hydralazine Hcl Swelling and Other (See Comments)    Leg swelling- patient wants this stopped as early as possible if it's been ordered- can only tolerate for a short window of time  . Irbesartan  Other (See Comments)    Cannot tolerate this in higher doses- causes fluid retention  . Losartan Potassium-Hctz Other (See Comments)    "Raised my potassium"     Home Medications  Prior to Admission medications   Medication Sig Start Date End Date Taking? Authorizing Provider  amLODipine (NORVASC) 10 MG tablet Take 10 mg by mouth daily.   Yes [provider]  aspirin EC 81 MG tablet Take 81 mg by mouth daily.   Yes [provider]  atorvastatin (LIPITOR) 80 MG tablet Take 80 mg by mouth at bedtime.   Yes [provider]  calcitRIOL (ROCALTROL) 0.5 MCG capsule Take 0.5 mcg by mouth daily.    Yes [provider]  carvedilol (COREG) 25 MG tablet Take 25 mg 2 (two) times daily with a meal by mouth.   Yes [provider]  cloNIDine (CATAPRES) 0.2 MG tablet Take 0.2 mg by mouth at bedtime as needed (sleep). Pt states he takes as needed for sleep,this med makes him sleepy 10/19/17  Yes [provider]  furosemide (LASIX) 80 MG tablet Take 80 mg at bedtime by mouth.   Yes [provider]  hydrOXYzine (ATARAX/VISTARIL) 10 MG tablet Take 10 mg by mouth daily as needed for itching or anxiety.    Yes [provider]  Lactobacillus Rhamnosus, GG, (CULTURELLE) CAPS Take 1 capsule by mouth daily.   Yes [provider]  minoxidil (LONITEN) 2.5 MG tablet Take 2.5 mg by mouth 2 (two) times daily. 12/02/19  Yes [provider]  multivitamin (RENA-VIT) TABS tablet Take 1 tablet by mouth 2 (two) times daily.   Yes [provider]  OLANZapine (ZYPREXA) 10 MG tablet Take 10 mg by mouth at bedtime. 01/02/19  Yes [provider]  Omega-3 Fatty Acids (OMEGA III EPA+DHA) 1000 MG CAPS Take 1,000 mg by mouth daily.    Yes [provider]  pantoprazole (PROTONIX) 40 MG tablet Take 40 mg by mouth daily.  07/03/16  Yes [provider]  sevelamer carbonate (RENVELA) 800 MG tablet Take 1,600 mg by mouth  See admin instructions. Take 1,600 mg by mouth three times a day with meals and 1,600 mg with each snack   Yes [provider]  tamsulosin (FLOMAX) 0.4 MG CAPS capsule Take 0.4 mg by mouth at bedtime.  12/09/18  Yes [provider]  Vitamin D, Ergocalciferol, (DRISDOL) 1.25 MG (50000 UNIT) CAPS capsule Take 50,000 Units by mouth every Friday.   Yes [provider]  WHITE PETROLATUM-MINERAL OIL OP Apply 1 application topically 2 (two) times daily as needed (itching).  03/12/17  Yes [provider]     Critical care time: 40 min     CRITICAL CARE Performed by: Cristal Generous   Total critical care time: 40 minutes  Critical care time was exclusive of separately billable procedures and treating other patients. Critical care was necessary to treat or prevent imminent or life-threatening deterioration.  Critical care was time spent personally by me on the following activities: development of treatment plan with patient and/or surrogate as well as nursing, discussions with consultants, evaluation of patient's response to treatment, examination of patient, obtaining history from patient or surrogate, ordering and performing treatments and interventions, ordering and review of laboratory studies, ordering and review of radiographic studies, pulse oximetry and re-evaluation of patient's condition.   Eliseo Gum MSN, AGACNP-BC [place] [phone] If no answer, [phone] 08/16/2020, 9:24 AM

## 2020-08-16 NOTE — ED Notes (Signed)
SDU Breakfast Ordered 

## 2020-08-16 NOTE — ED Notes (Signed)
Patient sitting up on the side of the bed , states he is going home continues to pull leads off, again educated him on the importance of staying in bed and not trying to get up. I told him he would be go to dialysis today however he states his is going to his normal dialysis unit. Will continue to monitor and redirect.

## 2020-08-16 NOTE — Progress Notes (Signed)
eLink Physician-Brief Progress Note Patient Name: Angel [name] DOB: May 09, 1971 MRN: [id number]   Date of Service  08/16/2020  HPI/Events of Note  Patient having PVC's and runs of SVT.   eICU Interventions  Plan" 1. BMP and Mg++ level STAT.     Intervention Category Major Interventions: Arrhythmia - evaluation and management  Aliza Moret [place] 08/16/2020, 11:45 PM

## 2020-08-16 NOTE — H&P (Addendum)
History and Physical    Holden Maniscalco Lenhardt MWN:[id number] DOB: 03-23-1971 DOA: 08/15/2020  PCP: Gwenlyn Saran, MD  Patient coming from: Home  I have personally briefly reviewed patient's old medical records in [place]  Chief Complaint: Fever  HPI: Champ Keetch is a 50 y.o. male with medical history significant of ESRD, CAD, CVA, seizure, HFrEF with EF 25-30%, anemia of ESRD, HTN.  Pt presents to the ED today for AMS, lethargy, fever, and hypoxia.  Onset of symptoms is unclear.  Pt apparently missed dialysis yesterday.     ED Course: In the ED pt with new 5L O2 requirement.  COVID positive.  Tm 103.3.  BUN 130, Creat 34.  CRP 15.1.  Procalcitonin 5.6.  HGB 8.8 is actually up from where he has been running.  WBC nl.  Given cefepime + Vanc for possible sepsis.  CXR shows pulm edema / atypical infectious process.  D.Dimer 7.0.  Patient lethargic, is oriented, but keeps insisting "theres nothing wrong with me" and trying to leave AMA.  He is confused.  Dr. Wyvonnia Dusky, myself, Nuala Alpha PA-C have all evaluated patient and dont feel he has decision making capacity at this time.  (Neither does his own mother for that matter.)   Review of Systems: As per HPI, otherwise all review of systems negative.  Past Medical History:  Diagnosis Date  . AAA (abdominal aortic aneurysm) ([place])   . Chronic anemia   . Chronic combined systolic and diastolic CHF (congestive heart failure) ([place])   . End stage renal disease ([place])   . ESRD on dialysis ([place])   . FSGS (focal segmental glomerulosclerosis)   . GERD (gastroesophageal reflux disease)   . HLD (hyperlipidemia)   . Hypertension   . NSTEMI (non-ST elevated myocardial infarction) ([place]) 07/2017   pt refused cath  . Pericardial effusion    a. small by echo 06/2017.  Marland Kitchen Renal disorder   . Seizure ([place])   . Stroke (cerebrum) [facility])     Past Surgical History:  Procedure Laterality Date  . ABDOMINAL AORTIC ANEURYSM  REPAIR    . KIDNEY SURGERY       reports that he has never smoked. He has never used smokeless tobacco. He reports that he does not drink alcohol and does not use drugs.  Allergies  Allergen Reactions  . Clonidine Derivatives Swelling and Other (See Comments)    Leg swelling  . Epoetin ([place])   . Heparin   . Hydralazine Hcl Swelling and Other (See Comments)    Leg swelling- patient wants this stopped as early as possible if it's been ordered- can only tolerate for a short window of time  . Irbesartan Other (See Comments)    Cannot tolerate this in higher doses- causes fluid retention  . Losartan Potassium-Hctz Other (See Comments)    "Raised my potassium"    Family History  Problem Relation Age of Onset  . Hypertension Mother   . Kidney disease Mother   . Asthma Father   . Hypertension Father   . Kidney disease Father   . Hypertension Sister   . Heart disease Sister   . Kidney disease Sister   . Heart disease Brother   . Hypertension Brother   . COPD Brother      Prior to Admission medications   Medication Sig Start Date End Date Taking? Authorizing Provider  amLODipine (NORVASC) 10 MG tablet Take 10 mg by mouth daily.   Yes [provider]  aspirin  EC 81 MG tablet Take 81 mg by mouth daily.   Yes [provider]  atorvastatin (LIPITOR) 80 MG tablet Take 80 mg by mouth at bedtime.   Yes [provider]  calcitRIOL (ROCALTROL) 0.5 MCG capsule Take 0.5 mcg by mouth daily.    Yes [provider]  carvedilol (COREG) 25 MG tablet Take 25 mg 2 (two) times daily with a meal by mouth.   Yes [provider]  cloNIDine (CATAPRES) 0.2 MG tablet Take 0.2 mg by mouth at bedtime as needed (sleep). Pt states he takes as needed for sleep,this med makes him sleepy 10/19/17  Yes [provider]  furosemide (LASIX) 80 MG tablet Take 80 mg at bedtime by mouth.   Yes [provider]  hydrOXYzine (ATARAX/VISTARIL) 10 MG tablet Take  10 mg by mouth daily as needed for itching or anxiety.    Yes [provider]  Lactobacillus Rhamnosus, GG, (CULTURELLE) CAPS Take 1 capsule by mouth daily.   Yes [provider]  minoxidil (LONITEN) 2.5 MG tablet Take 2.5 mg by mouth 2 (two) times daily. 12/02/19  Yes [provider]  multivitamin (RENA-VIT) TABS tablet Take 1 tablet by mouth 2 (two) times daily.   Yes [provider]  OLANZapine (ZYPREXA) 10 MG tablet Take 10 mg by mouth at bedtime. 01/02/19  Yes [provider]  Omega-3 Fatty Acids (OMEGA III EPA+DHA) 1000 MG CAPS Take 1,000 mg by mouth daily.    Yes [provider]  pantoprazole (PROTONIX) 40 MG tablet Take 40 mg by mouth daily.  07/03/16  Yes [provider]  sevelamer carbonate (RENVELA) 800 MG tablet Take 1,600 mg by mouth See admin instructions. Take 1,600 mg by mouth three times a day with meals and 1,600 mg with each snack   Yes [provider]  tamsulosin (FLOMAX) 0.4 MG CAPS capsule Take 0.4 mg by mouth at bedtime. 12/09/18  Yes [provider]  Vitamin D, Ergocalciferol, (DRISDOL) 1.25 MG (50000 UNIT) CAPS capsule Take 50,000 Units by mouth every Friday.   Yes [provider]  WHITE PETROLATUM-MINERAL OIL OP Apply 1 application topically 2 (two) times daily as needed (itching).  03/12/17  Yes [provider]    Physical Exam: Vitals:   08/15/20 2356 08/16/20 0000 08/16/20 0132 08/16/20 0215  BP:   (!) 170/122 (!) 167/118  Pulse:  98 100 96  Resp:  (!) 7 20 (!) 22  Temp: 99.2 F (37.3 C)     TempSrc: Axillary     SpO2:  91% 93% 94%  Weight:      Height:        Constitutional: Ill appearing, respiratory distress present. Eyes: PERRL, lids and conjunctivae normal ENMT: Mucous membranes are moist. Posterior pharynx clear of any exudate or lesions.Normal dentition.  Neck: normal, supple, no masses, no thyromegaly Respiratory: Increased WOB, not able to talk in complete  sentences Cardiovascular: Regular rate and rhythm, no murmurs / rubs / gallops. No extremity edema. 2+ pedal pulses. No carotid bruits.  Abdomen: no tenderness, no masses palpated. No hepatosplenomegaly. Bowel sounds positive.  Musculoskeletal: no clubbing / cyanosis. No joint deformity upper and lower extremities. Good ROM, no contractures. Normal muscle tone.  Skin: some uremic frost noted. Neurologic: CN 2-12 grossly intact. Sensation intact, DTR normal. Strength 5/5 in all 4.  Psychiatric: Poor judgment and insight.  Oriented x3.   Labs on Admission: I have personally reviewed following labs and imaging studies  CBC: Recent Labs  Lab 08/15/20 1921 08/15/20 2007  WBC 6.1  --   NEUTROABS 5.3  --   HGB 8.4* 8.8*  HCT 24.6* 26.0*  MCV 79.9*  --   PLT 145*  --    Basic Metabolic Panel: Recent Labs  Lab 08/15/20 1921 08/15/20 2007  NA 138 136  K 5.7* 5.7*  CL 97*  --   CO2 18*  --   GLUCOSE 118*  --   BUN 130*  --   CREATININE 34.09*  --   CALCIUM 8.9  --    GFR: Estimated Creatinine Clearance: 2.3 mL/min (A) (by C-G formula based on SCr of 34.09 mg/dL (H)). Liver Function Tests: Recent Labs  Lab 08/15/20 1921  AST 19  ALT 14  ALKPHOS 81  BILITOT 1.1  PROT 6.6  ALBUMIN 2.9*   No results for input(s): LIPASE, AMYLASE in the last 168 hours. No results for input(s): AMMONIA in the last 168 hours. Coagulation Profile: Recent Labs  Lab 08/15/20 1955  INR 1.2   Cardiac Enzymes: No results for input(s): CKTOTAL, CKMB, CKMBINDEX, TROPONINI in the last 168 hours. BNP (last 3 results) No results for input(s): PROBNP in the last 8760 hours. HbA1C: No results for input(s): HGBA1C in the last 72 hours. CBG: No results for input(s): GLUCAP in the last 168 hours. Lipid Profile: Recent Labs    08/15/20 2234  TRIG 410*   Thyroid Function Tests: No results for input(s): TSH, T4TOTAL, FREET4, T3FREE, THYROIDAB in the last 72 hours. Anemia Panel: Recent Labs     08/15/20 2234  FERRITIN 2,962*   Urine analysis:    Component Value Date/Time   COLORURINE YELLOW (A) 01/04/2018 1030   APPEARANCEUR CLEAR (A) 01/04/2018 1030   LABSPEC 1.004 (L) 01/04/2018 1030   PHURINE 9.0 (H) 01/04/2018 1030   GLUCOSEU NEGATIVE 01/04/2018 1030   HGBUR NEGATIVE 01/04/2018 1030   [place] 01/04/2018 [place] 01/04/2018 1030   PROTEINUR 100 (A) 01/04/2018 1030   NITRITE NEGATIVE 01/04/2018 1030   LEUKOCYTESUR NEGATIVE 01/04/2018 1030    Radiological Exams on Admission: DG Chest Portable 1 View  Result Date: 08/15/2020 CLINICAL DATA:  Shortness of breath and fever EXAM: PORTABLE CHEST 1 VIEW COMPARISON:  August 20, 2018 FINDINGS: There is a well-positioned tunneled dialysis catheter on the right. There is cardiomegaly. There is vascular congestion with hazy airspace opacities bilaterally suggestive of mild interstitial edema. There is no large pleural effusion. No pneumothorax. IMPRESSION: 1. Cardiomegaly with findings of vascular congestion. 2. Hazy bilateral airspace opacities suggestive of mild interstitial edema. An atypical infectious process is difficult to entirely exclude. Electronically Signed   By: Constance Holster M.D.   On: 08/15/2020 19:15    EKG: Independently reviewed.  Assessment/Plan Principal Problem:   Acute respiratory failure with hypoxia ([name]) Active Problems:   ESRD (end stage renal disease) on dialysis ([name])   Volume overload   Anemia in ESRD (end-stage renal disease) ([name])   HTN (hypertension)   Uremia   Dialysis patient, noncompliant ([place])   Acute metabolic encephalopathy   COVID-19 virus infection    1. Acute respiratory failure with hypoxia - 1. Multifactorial due to COVID-19 and volume overload from missed dialysis session(s). 2. May or may not also have superimposed bacterial infection (procalcitonin elevated), and/or pulmonary emboli (D.Dimer of 7) 1. CTA chest PE pending for further evaluation  (cleared IV contrast dye with Dr. Royce Macadamia). 2. Empiric cefepime / vanc for the moment 2. COVID-19 1. Covid pathway 2. remdesivir  per pharm 3. Decadron 4. Daily labs 3. Volume overload / uremia / ESRD - 1. Dialysis in AM 2. Got temp measures for K 5.7 3. See Dr. Royce Macadamia note 4. Acute metabolic encephalopathy - 1. Due to some combination of above factors 2. Pt with repetitive speech 3. States "theres nothing wrong with me", doesn't seem to grasp medical situation. 4. Trying to leave AMA but neither I, Dr. Wyvonnia Dusky, nor Athena Masse PA-C feel that he has decision making capacity at the moment due to his altered mental status.  Neither does patient's own mother for that matter. 27. Mother agrees that we should keep patient and treat him medically. 6. Get psych consult in AM to confirm that patient doesn't have capacity at this time. 5. Anemia in ESRD - 1. Epo per nephro 6. HTN - 1. Re-ordering amlodipine and coreg 2. Doesn't sound like his med rec here in the ED is otherwise accurate according to my discussion with Dr. Royce Macadamia.  DVT prophylaxis: SCDs for the moment - pt reports rxn to Heparin, not clear if real or not though. Code Status: Full Code - assumed a the moment, pt not felt to have decision making capacity. Family Communication: Spoke with mother on phone.  She agrees he is too sick to be thinking clearly and doesn't have decision making capacity, agrees with [place] admitting to hospital. Disposition Plan: Home when dialyzed and O2 requirement improved from covid standpoint Consults called: Dr. Royce Macadamia Admission status: Admit to inpatient  Severity of Illness: The appropriate patient status for this patient is INPATIENT. Inpatient status is judged to be reasonable and necessary in order to provide the required intensity of service to ensure the patient's safety. The patient's presenting symptoms, physical exam findings, and initial radiographic and laboratory data in the context of their  chronic comorbidities is felt to place them at high risk for further clinical deterioration. Furthermore, it is not anticipated that the patient will be medically stable for discharge from the hospital within 2 midnights of admission. The following factors support the patient status of inpatient.   IP status due to: 1) new O2 requirement from COVID and fluid overload 2) need for emergent dialysis due to fluid overload and uremia 3) AMS from above   * I certify that at the point of admission it is my clinical judgment that the patient will require inpatient hospital care spanning beyond 2 midnights from the point of admission due to high intensity of service, high risk for further deterioration and high frequency of surveillance required.*    Daneli Butkiewicz M. DO [facility]  How to contact the [facility] Attending or Consulting provider [place] or covering provider during after hours [place], for this patient?  1. Check the care team in [facility] and look for a) attending/consulting [facility] provider listed and b) the [facility] team listed 2. Log into [email or web address]  Amion Physician Scheduling and messaging for groups and whole hospitals  On call and physician scheduling software for group practices, residents, hospitalists and other medical providers for call, clinic, rotation and shift schedules. OnCall Enterprise is a hospital-wide system for scheduling doctors and paging doctors on call. EasyPlot is for scientific plotting and data analysis.  [email or web address]  and use [place]'s universal password to access. If you do not have the password, please contact the hospital operator.  3. Locate the [facility] provider you are looking for under [facility] and page to a number that you can be directly reached. 4. If you still  have difficulty reaching the provider, please page the [facility] (Director on Call) for the Hospitalists listed on amion for assistance.  08/16/2020, 2:22 AM

## 2020-08-16 NOTE — ED Notes (Signed)
Pt transported to CT ?

## 2020-08-16 NOTE — ED Notes (Signed)
Pt refusing lab draw.

## 2020-08-16 NOTE — ED Notes (Addendum)
Pt refusing to wear cardiac monitoring leads and also requesting to leave AMA. Pt can answer all orientation questions appropriately however is confused when having a full conversation. Pt reported he's not normally on oxygen, noted to be on 5L. Oxygen turned off, sats quickly dropped to 75% on room air; oxygen increased back to 5L with improvement of sats to 92%. Convinced pt to at least wear pulse ox while waiting admitting MD.

## 2020-08-16 NOTE — ED Notes (Addendum)
Patient continues to take leads, b/p cuff and sat monitor off states he is leaving AMA , admitting MD aware. Patient is very shakey and he states wants to leave for dialysis  Patient was informed he would be getting dialysis here. Multiple attempts at educating patient as to the reason he needs to stay for treatment.

## 2020-08-16 NOTE — Consult Note (Signed)
Angel [name] is a 50 yo COVID positive M with ESRD who presents to ED with encephalopathy, likely uremic in setting of missed HDs.  PCCM consult for possible dexmedatomidine for compliance with HD.   Psych consult placed for uremic encephalopathy, lacks capacity. Unable to complete psych evaluation as this patient requires critical care for management of his ongoing medical conditions. This Probation officer did speak with ordering provider Dr. Sarajane Jews, in order to assist with IVC completion. He reports patient was threatening to leave AMA, however is not capable of making the decision. Further more he has tested positive for COVID and is a danger to self and others, as he will not likely quarantine and isolate as directed. -WIll initiate IVC. -Will start Zyprexa 2.5mg  po qhs for psychosis. We expect this to resolved once he gets dialyzed and treated for COVID-19.    Psych to sign off at this time, re consult once patient is medically stable and if psych consult is warranted.

## 2020-08-16 NOTE — ED Notes (Signed)
Pt IVC papers faxed

## 2020-08-16 NOTE — ED Notes (Signed)
Dr. Gardner at bedside 

## 2020-08-16 NOTE — ED Notes (Signed)
Lunch Tray Ordered 1115. 

## 2020-08-16 NOTE — ED Notes (Signed)
Patient is very weak trying to get off bed , MD aware, continues to pull all leads off.

## 2020-08-16 NOTE — ED Notes (Signed)
Per Alcario Drought, MD. Pt lack decision making capacities. Pts mother is agreeable with pt staying in the hospital to receive care.  Pt is continuing to take monitoring devices off. He has been educated several times on the importance of the devices.

## 2020-08-16 NOTE — Progress Notes (Signed)
[facility] Progress Note  Subjective: pt seen in ED.  Refuses to have dialysis here, I asked him several different times in different ways and he was adamant that he wants to go to his outpatient dialysis place for dialysis. He appears confused.   Vitals:   08/16/20 0815 08/16/20 0830 08/16/20 1032 08/16/20 1200  BP: (!) 175/130 (!) 177/129 (!) 168/135 (!) 173/134  Pulse: 100  (!) 109   Resp: (!) 25 (!) 23 (!) 23 18  Temp:   98.1 F (36.7 C)   TempSrc:   Oral   SpO2: 93%  97%   Weight:      Height:        Exam:   alert, awake, shaking/ trembling, possibly myoclonic jerking  of the hands ands arms mostly  no jvd  Chest cta bilat to base  Cor reg no RG  Abd soft ntnd no ascites   Ext no LE edema   Alert, nonfocal, moves all ext, +myoclonic jerking   R IJ TDC     OP HD: [name]  - get records   CXR - hazy bilateral airspace opacities suggestive of mild interstitial edema. An atypical infectious process is difficult to entirely exclude   CTA chest - diffuse bilateral ground-glass airspace opacities, which may be secondary to developing pulmonary edema or an atypical infectious Process. Small bilateral pleural effusions.     Assessment/ Plan: # End-stage renal disease on HD - Hemodialysis when able.  Orders are entered but pt is refusing to do HD here.  They are in process of IVC procedure now.  He seems confused.  May have to be sedated in order for dialysis to be done, have d/w pmd.   # AMS / uremia - very high numbers - hope this will improve with HD, if we can get him dialyzed     # COVID + - Abx per primary team  - getting steroids and remdesivir per pmd  # Acute hypoxic respiratory failure - on 3-4 L [place], patchy ground glass areas on CTA chest - optimize volume status with HD when able   # Hyperkalemia  - lokelma and sodium bicarbonate once while awaiting HD   # HTN with CKD  - optimize volume status with HD - Would continue  home regimen   # Anemia of CKD  - No emergent need for PRBC's - Will need to get outpatient rx to determine when he last got ESA  # Metabolic bone disease - Will need to get outpatient rx   Rob Agustus Mane 08/16/2020, 12:35 PM   Recent Labs  Lab 08/15/20 1921 08/15/20 2007  K 5.7* 5.7*  BUN 130*  --   CREATININE 34.09*  --   CALCIUM 8.9  --   HGB 8.4* 8.8*   Inpatient medications: . amLODipine  10 mg Oral Daily  . carvedilol  25 mg Oral BID WC  . Chlorhexidine Gluconate Cloth  6 each Topical Q0600  . heparin sodium (porcine)      . methylPREDNISolone (SOLU-MEDROL) injection  60 mg Intravenous Q12H  . metroNIDAZOLE  500 mg Oral Q8H  . OLANZapine  2.5 mg Oral QHS   . ceFEPime (MAXIPIME) IV    . dexmedetomidine (PRECEDEX) IV infusion    . [START ON 08/17/2020] vancomycin     acetaminophen, haloperidol lactate, labetalol, ondansetron **OR** ondansetron (ZOFRAN) IV

## 2020-08-16 NOTE — ED Notes (Signed)
Communications called @ 1058-[facility] serve only.

## 2020-08-16 NOTE — ED Notes (Signed)
Pt refused vitals 

## 2020-08-17 ENCOUNTER — Inpatient Hospital Stay (HOSPITAL_COMMUNITY): Payer: Medicare (Managed Care)

## 2020-08-17 DIAGNOSIS — G9349 Other encephalopathy: Secondary | ICD-10-CM

## 2020-08-17 DIAGNOSIS — N19 Unspecified kidney failure: Secondary | ICD-10-CM | POA: Diagnosis not present

## 2020-08-17 DIAGNOSIS — R7989 Other specified abnormal findings of blood chemistry: Secondary | ICD-10-CM | POA: Diagnosis not present

## 2020-08-17 LAB — BASIC METABOLIC PANEL
Anion gap: 19 — ABNORMAL HIGH (ref 5–15)
BUN: 44 mg/dL — ABNORMAL HIGH (ref 6–20)
CO2: 22 mmol/L (ref 22–32)
Calcium: 9.4 mg/dL (ref 8.9–10.3)
Chloride: 94 mmol/L — ABNORMAL LOW (ref 98–111)
Creatinine, Ser: 11.32 mg/dL — ABNORMAL HIGH (ref 0.61–1.24)
GFR, Estimated: 5 mL/min — ABNORMAL LOW (ref >60)
Glucose, Bld: 145 mg/dL — ABNORMAL HIGH (ref 70–99)
Potassium: 3.1 mmol/L — ABNORMAL LOW (ref 3.5–5.1)
Sodium: 135 mmol/L (ref 135–145)

## 2020-08-17 LAB — CBC WITH DIFFERENTIAL/PLATELET
Abs Immature Granulocytes: 0.03 10*3/uL (ref 0.00–0.07)
Basophils Absolute: 0 10*3/uL (ref 0.0–0.1)
Basophils Relative: 0 %
Eosinophils Absolute: 0 10*3/uL (ref 0.0–0.5)
Eosinophils Relative: 0 %
HCT: 26.3 % — ABNORMAL LOW (ref 39.0–52.0)
Hemoglobin: 8.8 g/dL — ABNORMAL LOW (ref 13.0–17.0)
Immature Granulocytes: 1 %
Lymphocytes Relative: 4 %
Lymphs Abs: 0.2 10*3/uL — ABNORMAL LOW (ref 0.7–4.0)
MCH: 26.2 pg (ref 26.0–34.0)
MCHC: 33.5 g/dL (ref 30.0–36.0)
MCV: 78.3 fL — ABNORMAL LOW (ref 80.0–100.0)
Monocytes Absolute: 0.1 10*3/uL (ref 0.1–1.0)
Monocytes Relative: 3 %
Neutro Abs: 3.7 10*3/uL (ref 1.7–7.7)
Neutrophils Relative %: 92 %
Platelets: 199 10*3/uL (ref 150–400)
RBC: 3.36 MIL/uL — ABNORMAL LOW (ref 4.22–5.81)
RDW: 15.8 % — ABNORMAL HIGH (ref 11.5–15.5)
WBC: 4 10*3/uL (ref 4.0–10.5)
nRBC: 0 % (ref 0.0–0.2)

## 2020-08-17 LAB — COMPREHENSIVE METABOLIC PANEL
ALT: 16 U/L (ref 0–44)
AST: 22 U/L (ref 15–41)
Albumin: 2.7 g/dL — ABNORMAL LOW (ref 3.5–5.0)
Alkaline Phosphatase: 82 U/L (ref 38–126)
Anion gap: 20 — ABNORMAL HIGH (ref 5–15)
BUN: 64 mg/dL — ABNORMAL HIGH (ref 6–20)
CO2: 22 mmol/L (ref 22–32)
Calcium: 9.5 mg/dL (ref 8.9–10.3)
Chloride: 95 mmol/L — ABNORMAL LOW (ref 98–111)
Creatinine, Ser: 18.93 mg/dL — ABNORMAL HIGH (ref 0.61–1.24)
GFR, Estimated: 3 mL/min — ABNORMAL LOW (ref >60)
Glucose, Bld: 162 mg/dL — ABNORMAL HIGH (ref 70–99)
Potassium: 5.2 mmol/L — ABNORMAL HIGH (ref 3.5–5.1)
Sodium: 137 mmol/L (ref 135–145)
Total Bilirubin: 1.1 mg/dL (ref 0.3–1.2)
Total Protein: 6.8 g/dL (ref 6.5–8.1)

## 2020-08-17 LAB — HEPATITIS B CORE ANTIBODY, TOTAL: Hep B Core Total Ab: NONREACTIVE

## 2020-08-17 LAB — HEPATITIS B SURFACE ANTIGEN: Hepatitis B Surface Ag: NONREACTIVE

## 2020-08-17 LAB — MAGNESIUM: Magnesium: 2.3 mg/dL (ref 1.7–2.4)

## 2020-08-17 LAB — GLUCOSE, CAPILLARY: Glucose-Capillary: 198 mg/dL — ABNORMAL HIGH (ref 70–99)

## 2020-08-17 LAB — C-REACTIVE PROTEIN: CRP: 11.2 mg/dL — ABNORMAL HIGH (ref <1.0)

## 2020-08-17 LAB — D-DIMER, QUANTITATIVE: D-Dimer, Quant: 7.91 ug/mL-FEU — ABNORMAL HIGH (ref 0.00–0.50)

## 2020-08-17 MED ORDER — LABETALOL HCL 5 MG/ML IV SOLN
INTRAVENOUS | Status: AC
  Filled 2020-08-17: qty 4

## 2020-08-17 MED ORDER — SODIUM CHLORIDE 0.9 % IV SOLN
100.0000 mg | Freq: Every day | INTRAVENOUS | Status: DC
  Administered 2020-08-17: 100 mg via INTRAVENOUS
  Filled 2020-08-17: qty 100

## 2020-08-17 MED ORDER — LABETALOL HCL 5 MG/ML IV SOLN
10.0000 mg | INTRAVENOUS | Status: DC | PRN
  Administered 2020-08-17 (×2): 20 mg via INTRAVENOUS
  Filled 2020-08-17: qty 4

## 2020-08-17 MED ORDER — APIXABAN 2.5 MG PO TABS
2.5000 mg | ORAL_TABLET | Freq: Two times a day (BID) | ORAL | Status: DC
  Administered 2020-08-17: 2.5 mg via ORAL
  Filled 2020-08-17: qty 1

## 2020-08-17 NOTE — Progress Notes (Signed)
Notified E-link patient BP elevated despite PRN labetalol 10mg  given. Awaiting new PRN BP medicine.

## 2020-08-17 NOTE — Progress Notes (Addendum)
Patient refused PRN blood pressure medicine for BP 175/123. Asked patient why he refused and explained risks, he stated "At [facility] they told me after my aneurysm my blood pressure would never be the same." Notifying E-link of refusal.  Blood pressure now 193/167, again explained risks of elevated blood pressure to patient and he said he is okay with getting BP medicine.

## 2020-08-17 NOTE — Progress Notes (Cosign Needed)
I have called the patient's mother, Velora Heckler. I have updated her on her son's condition. I explained that he had dialysis last night and that we were able to pull off 3 L of fluid, so his breathing is better. I also explained that he was no longer on medication to keep him calm. I told her he would be moving out of the ICU today, and that he would have a second dialysis treatment today or tomorrow morning. She verbalized understanding of the above and had no further questions at completion of the call.   Magdalen Spatz, MSN, AGACNP-BC [place] for personal pager 08/17/2020 2:38 PM

## 2020-08-17 NOTE — Progress Notes (Signed)
eLink Physician-Brief Progress Note Patient Name: Angel [name] DOB: 1971/05/08 MRN: [id number]   Date of Service  08/17/2020  HPI/Events of Note  Nursing states that patient was told he could be d/c home after HD.  Got HD early (tonight) and wants to go home now.  Has ride here waiting on him.  Review of the chart reveals no plans to D/C the patient to home. PCCM notes only mention transfer out of ICU to [facility] service.   eICU Interventions  Continue management as directed by rounding PCCM team.      Intervention Category Major Interventions: Other:  [name] 08/17/2020, 11:10 PM

## 2020-08-17 NOTE — Progress Notes (Signed)
eLink Physician-Brief Progress Note Patient Name: Angel [name] DOB: 1971-02-02 MRN: [id number]   Date of Service  08/17/2020  HPI/Events of Note  Hypertension - BP  = 191/79. HR = 90.   eICU Interventions  Plan: 1. Change Labetalol to 10-20 gm IV Q 2 hours PRN SBP > 180 or DBP > 110.      Intervention Category Major Interventions: Hypertension - evaluation and management  Farrie Sann [place] 08/17/2020, 4:14 AM

## 2020-08-17 NOTE — Progress Notes (Signed)
Renal Navigator received call from Charge RN/Dawn at his outpatient clinic/[name] requesting an update on patient. Update given based on progress notes. Charge RN states behavior is very uncharacteristic of patient. H&P and most recent renal note faxed. Navigator requested outpatient records be faxed in inpt HD unit.   Alphonzo Cruise, [place] Renal Navigator [phone]

## 2020-08-17 NOTE — Progress Notes (Signed)
[facility] Progress Note  Subjective: pt seen in ICU, looks calmer. Off precedex at the moment but the precedex helped a lot last night according to the staff.   Vitals:   08/17/20 0800 08/17/20 0900 08/17/20 1000 08/17/20 1100  BP: (!) 166/124 (!) 149/97 (!) 156/120 (!) 155/104  Pulse: 76 86 90 (!) 217  Resp: (!) 22 20 (!) 30 18  Temp: 99.2 F (37.3 C)     TempSrc: Oral     SpO2: 100% (!) 59% (!) 89% (!) 79%  Weight:      Height:        Exam:   alert, awake, interactive, nasal O2  no jvd  Chest cta bilat to base  Cor reg no RG  Abd soft ntnd no ascites   Ext no LE edema   Alert, nonfocal, moves all ext, +asterixis persists   R IJ TDC     OP HD: [name]  - 3h 52min  63kg  Hep none   CXR - hazy bilateral airspace opacities suggestive of mild interstitial edema. An atypical infectious process is difficult to entirely exclude   CTA chest - diffuse bilateral ground-glass airspace opacities, which may be secondary to developing pulmonary edema or an atypical infectious Process. Small bilateral pleural effusions.     Assessment/ Plan: # ESRD - on HD TTS - pt refused HD yest and required IVC and ICU admit w/ precedex drip to enable dialysis. Had HD yest. Next HD tonight or tomorrow.   # AMS / uremia - very high numbers on admit - looks much better, still myoclonic jerking though - labs much better - plan HD again , hopefully tonight but might not be until tomorrow  # COVID + - Abx per primary team  - getting steroids and remdesivir per pmd  # Acute hypoxic respiratory failure - on 3-4 L [place], patchy ground glass areas on CTA chest, suspect fluid - 3 L off w/ HD yest - at dry wt, no vol ^ on exam - down to 1 L [place]  # Hyperkalemia  -  # HTN with CKD  - down to dry wt - refusing po BP meds unfortunately, getting IV meds  # Anemia of CKD  - No emergent need for PRBC's - Hb mid to high 8's, follow  # Metabolic bone disease -  Will need to get outpatient rx   Rob Angel [name] 08/17/2020, 12:07 PM   Recent Labs  Lab 08/16/20 1333 08/16/20 2345 08/17/20 0643  K 5.9* 3.1* 5.2*  BUN 144* 44* 64*  CREATININE 34.53* 11.32* 18.93*  CALCIUM 9.4 9.4 9.5  PHOS 10.3*  --   --   HGB 8.2*  --  8.8*   Inpatient medications: . amLODipine  10 mg Oral Daily  . apixaban  2.5 mg Oral BID  . carvedilol  25 mg Oral BID WC  . Chlorhexidine Gluconate Cloth  6 each Topical Q0600  . labetalol      . mouth rinse  15 mL Mouth Rinse BID  . methylPREDNISolone (SOLU-MEDROL) injection  60 mg Intravenous Q12H  . OLANZapine  2.5 mg Oral QHS   . sodium chloride    . sodium chloride    . ceFEPime (MAXIPIME) IV Stopped (08/16/20 2345)  . dexmedetomidine (PRECEDEX) IV infusion Stopped (08/16/20 1944)  . remdesivir 100 mg in NS 100 mL     sodium chloride, sodium chloride, acetaminophen, haloperidol lactate, labetalol, lidocaine (PF), lidocaine-prilocaine, ondansetron **OR** ondansetron (ZOFRAN) IV, pentafluoroprop-tetrafluoroeth

## 2020-08-17 NOTE — Progress Notes (Signed)
Bilateral lower extremity venous duplex has been completed. Preliminary results can be found in CV Proc through chart review.   08/17/20 12:38 PM Carlos Levering RVT

## 2020-08-17 NOTE — Progress Notes (Addendum)
Doppler negative for DVT. Pt. Started on Eliquis at prophylactic dose for DVT prevention  Magdalen Spatz, MSN, AGACNP-BC [place] for personal pager 08/17/2020 1:55 PM

## 2020-08-17 NOTE — Consult Note (Signed)
NAME:  Alphonso Gregson, MRN:  [id number], DOB:  20-Oct-1970, LOS: 1 ADMISSION DATE:  08/15/2020, CONSULTATION DATE:  08/16/20 REFERRING MD:  Sarajane Jews - TR[facility] , CHIEF COMPLAINT:  Encephalopathy    Brief History:  50 yo COVID positive M with ESRD who presents to ED with encephalopathy, likely uremic in setting of missed HDs.  PCCM consult for possible dexmedatomidine for compliance with HD   History of Present Illness:  50 yo M PMH ESRD, prior CVA, seizure, HFrEF presents to ED 1/9 with AMS, hypoxia. Pt missed at least 1 HD session outpatient, onset of AMS and hypoxia is unclear. In ED pt with new O2 requirement of 5L, found to be COVID-19 positive. BMP reveals BUN 130 Cr 34, and encephalopathy felt likely to be uremic in etiology.  Admitted to [facility], nephrology consulted for HD.   Unfortunately, degree of pts encephalopathy is limiting ability to provide needed medical care such as HD -- the patient has reportedly declined HD stating "there is nothing wrong with me" and has tried to leave AMA. The patient is not felt to have capacity at this time given degree of encephalopathy.   PCCM consulted for consideration of possible precedex to allow for medical treatment of this patient.   Past Medical History:  ESRD CVA HTN HFrEF  AAA GERD HLD NSTEMI Pericardial effusion Seizure   Significant Hospital Events:  1/9 presented to ED for encephalopathy, likely uremic.  COVID +. Started on abx for possible sepsis 1/10 admitted to [facility]. Nephro consulted. Pt declining medical interventions, wants to leave AMA, but lacking capacity. PCCM consulted for   possible sedation.   Consults:  Nephro PCCM  Procedures:    Significant Diagnostic Tests:  1/10 CTA chest> no PE. bilat GGO, pulm edema. Small bilateral pleural effusions. RLL atelectasis  Large hepatic cyst   Micro Data:  1/9 COVID-19 positive  1/9 BCx>  1/9 UCx>  Antimicrobials:  1/9 cefepime> 1/9 flagyl>  1/9 vanc>  Interim History /  Subjective:  Creatinine 34.53>> 11.32>> 18.93 BUN 144>>44>>64 Net negative 1792 K >> 5.2 CRP 11.2 HGB 8.8, PLTS 199 WBC is 4, T Max 99.2 Mag 2.3 Pt. Is continuing to refuse HTN meds. States his tremor is 2/2 his HTN meds, which is why he is refusing them.  Still does not believe he has Covid. Blood Culture pending D dimer is 7.91, PAS hose at present due to patient reported allergy to Heparin. Pharmacy is investigating heparin allergy   Objective   Blood pressure (!) 166/124, pulse 76, temperature 99.2 F (37.3 C), temperature source Oral, resp. rate (!) 22, height 5\' 5"  (1.651 m), weight 62.2 kg, SpO2 100 %.        Intake/Output Summary (Last 24 hours) at 08/17/2020 0844 Last data filed at 08/17/2020 0600 Gross per 24 hour  Intake 734.96 ml  Output 3027 ml  Net -2292.04 ml   Filed Weights   08/15/20 2017 08/16/20 1300  Weight: 61.2 kg 62.2 kg    Examination: General: Acutely and chronically ill appearing middle aged M, sitting on BSC, less combative HENT: NCAT, pink mm. Patent nares with Brodnax in place at 1 L. R HD cath, dressing is clean dry and intact Lungs: Bilateral chest excursion, Coarse bilaterally. Diminished basilar sounds No accessory use  Cardiovascular: rrr s1s2 no rgm  Abdomen: soft ndnt + bowel sounds Extremities: No obvious  deformity. No pitting edema. No cyanosis or clubbing Neuro: Awake, alert to self and place, more oriented. + asterixis  Psych:  Answers questions, still does not believe he has Covid GU: defer   Resolved Hospital Problem list     Assessment & Plan:   Acute encephalopathy, likely uremic Creatinine better after HD, but has quickly worsened since HD Off precedex since pm 1/11 P -IVC paperwork completed  -needs HD again 1/11 per renal -psych has ordered zyprexa PO qHS for psychosis>> Continue -with degree of encephalopathy , more co-operative 1/11,  - Can transfer back to [facility] 1/11 as off precedex, and can transfer out of  ICU  ESRD Hyperkalemia - iHD per nephro again 1/11 - BMET daily and prn  Acute hypoxic respiratory failure, multifactorial  Due to: COVID-19 infection Small bilateral pleural effusions  Pulmonary edema >> weaned to 1 L [place] as volume pulled off P -Continue HD for volume removal -contact/airborne -Supplemental O2  -when encephalopathy improves will need IS, encourage self prone etc  -solumedrol, remdesivir  -trend inflammatory markers -Has heparin allergy   HTN Refusing po meds May need to keep IV prn as option  -norvasc, coreg -PRN labetalol   Possible sepsis, is known COVID -19 pos Blood Cx pending -broad abx started in ED -elevated PCT not hugely convincing in setting of renal failure P -follow cx data, can likely de-escalate  -trend fever curve WBC   Elevated D dimer Heparin Allergy Plan Will treat with po agent as prophylaxis Bilateral lower extremity dopplers Will transition to treatment dose if +  Best practice (evaluated daily)  Diet: Renal with fluid restriction 1200 cc Pain/Anxiety/Delirium protocol (if indicated): Haldol VAP protocol (if indicated): n/a DVT prophylaxis: SCD - heparin allergy.  GI prophylaxis: na Glucose control: monior Mobility: BR Disposition:ICU   Goals of Care:  Last date of multidisciplinary goals of care discussion: Family and staff present Summary of discussion:  Follow up goals of care discussion due: 1/17 Code Status: Full   Labs   CBC: Recent Labs  Lab 08/15/20 1921 08/15/20 2007 08/16/20 1333 08/17/20 0643  WBC 6.1  --  3.1* PENDING  NEUTROABS 5.3  --   --  PENDING  HGB 8.4* 8.8* 8.2* 8.8*  HCT 24.6* 26.0* 24.8* 26.3*  MCV 79.9*  --  79.2* 78.3*  PLT 145*  --  157 324    Basic Metabolic Panel: Recent Labs  Lab 08/15/20 1921 08/15/20 2007 08/16/20 1333 08/16/20 2345 08/17/20 0643  NA 138 136 137 135 137  K 5.7* 5.7* 5.9* 3.1* 5.2*  CL 97*  --  93* 94* 95*  CO2 18*  --  17* 22 22  GLUCOSE 118*  --   130* 145* 162*  BUN 130*  --  144* 44* 64*  CREATININE 34.09*  --  34.53* 11.32* 18.93*  CALCIUM 8.9  --  9.4 9.4 9.5  MG  --   --   --  2.3  --   PHOS  --   --  10.3*  --   --    GFR: Estimated Creatinine Clearance: 4.1 mL/min (A) (by C-G formula based on SCr of 18.93 mg/dL (H)). Recent Labs  Lab 08/15/20 1921 08/15/20 1955 08/15/20 2234 08/15/20 2251 08/16/20 1333 08/17/20 0643  PROCALCITON  --   --  5.61  --   --   --   WBC 6.1  --   --   --  3.1* PENDING  LATICACIDVEN  --  1.3  --  1.0  --   --     Liver Function Tests: Recent Labs  Lab 08/15/20 1921 08/16/20 1333 08/17/20  0643  AST 19  --  22  ALT 14  --  16  ALKPHOS 81  --  82  BILITOT 1.1  --  1.1  PROT 6.6  --  6.8  ALBUMIN 2.9* 2.7* 2.7*   No results for input(s): LIPASE, AMYLASE in the last 168 hours. No results for input(s): AMMONIA in the last 168 hours.  ABG    Component Value Date/Time   HCO3 20.4 08/15/2020 2007   TCO2 21 (L) 08/15/2020 2007   ACIDBASEDEF 3.0 (H) 08/15/2020 2007   O2SAT 98.0 08/15/2020 2007     Coagulation Profile: Recent Labs  Lab 08/15/20 1955  INR 1.2    Cardiac Enzymes: No results for input(s): CKTOTAL, CKMB, CKMBINDEX, TROPONINI in the last 168 hours.  HbA1C: No results found for: HGBA1C  CBG: Recent Labs  Lab 08/16/20 1629  GLUCAP 117*     Past Medical History:  He,  has a past medical history of AAA (abdominal aortic aneurysm) ([place]), Chronic anemia, Chronic combined systolic and diastolic CHF (congestive heart failure) ([place]), End stage renal disease ([place]), ESRD on dialysis ([place]), FSGS (focal segmental glomerulosclerosis), GERD (gastroesophageal reflux disease), HLD (hyperlipidemia), Hypertension, NSTEMI (non-ST elevated myocardial infarction) ([place]) (07/2017), Pericardial effusion, Renal disorder, Seizure ([place]), and Stroke (cerebrum) ([place]).   Surgical History:   Past Surgical History:  Procedure Laterality Date  . ABDOMINAL AORTIC ANEURYSM REPAIR    .  KIDNEY SURGERY       Social History:   reports that he has never smoked. He has never used smokeless tobacco. He reports that he does not drink alcohol and does not use drugs.   Family History:  His family history includes Asthma in his father; COPD in his brother; Heart disease in his brother and sister; Hypertension in his brother, father, mother, and sister; Kidney disease in his father, mother, and sister.   Allergies Allergies  Allergen Reactions  . Clonidine Derivatives Swelling and Other (See Comments)    Leg swelling  . Epoetin ([place])   . Heparin   . Hydralazine Hcl Swelling and Other (See Comments)    Leg swelling- patient wants this stopped as early as possible if it's been ordered- can only tolerate for a short window of time  . Irbesartan Other (See Comments)    Cannot tolerate this in higher doses- causes fluid retention  . Losartan Potassium-Hctz Other (See Comments)    "Raised my potassium"     Home Medications  Prior to Admission medications   Medication Sig Start Date End Date Taking? Authorizing Provider  amLODipine (NORVASC) 10 MG tablet Take 10 mg by mouth daily.   Yes [provider]  aspirin EC 81 MG tablet Take 81 mg by mouth daily.   Yes [provider]  atorvastatin (LIPITOR) 80 MG tablet Take 80 mg by mouth at bedtime.   Yes [provider]  calcitRIOL (ROCALTROL) 0.5 MCG capsule Take 0.5 mcg by mouth daily.    Yes [provider]  carvedilol (COREG) 25 MG tablet Take 25 mg 2 (two) times daily with a meal by mouth.   Yes [provider]  cloNIDine (CATAPRES) 0.2 MG tablet Take 0.2 mg by mouth at bedtime as needed (sleep). Pt states he takes as needed for sleep,this med makes him sleepy 10/19/17  Yes [provider]  furosemide (LASIX) 80 MG tablet Take 80 mg at bedtime by mouth.   Yes [provider]  hydrOXYzine (ATARAX/VISTARIL) 10 MG tablet Take 10 mg by mouth  daily as needed for itching or  anxiety.    Yes [provider]  Lactobacillus Rhamnosus, GG, (CULTURELLE) CAPS Take 1 capsule by mouth daily.   Yes [provider]  minoxidil (LONITEN) 2.5 MG tablet Take 2.5 mg by mouth 2 (two) times daily. 12/02/19  Yes [provider]  multivitamin (RENA-VIT) TABS tablet Take 1 tablet by mouth 2 (two) times daily.   Yes [provider]  OLANZapine (ZYPREXA) 10 MG tablet Take 10 mg by mouth at bedtime. 01/02/19  Yes [provider]  Omega-3 Fatty Acids (OMEGA III EPA+DHA) 1000 MG CAPS Take 1,000 mg by mouth daily.    Yes [provider]  pantoprazole (PROTONIX) 40 MG tablet Take 40 mg by mouth daily.  07/03/16  Yes [provider]  sevelamer carbonate (RENVELA) 800 MG tablet Take 1,600 mg by mouth See admin instructions. Take 1,600 mg by mouth three times a day with meals and 1,600 mg with each snack   Yes [provider]  tamsulosin (FLOMAX) 0.4 MG CAPS capsule Take 0.4 mg by mouth at bedtime. 12/09/18  Yes [provider]  Vitamin D, Ergocalciferol, (DRISDOL) 1.25 MG (50000 UNIT) CAPS capsule Take 50,000 Units by mouth every Friday.   Yes [provider]  WHITE PETROLATUM-MINERAL OIL OP Apply 1 application topically 2 (two) times daily as needed (itching).  03/12/17  Yes [provider]     Critical care time: 45 min     CRITICAL CARE Performed by: Magdalen Spatz   Total critical care time: 45 minutes  Critical care time was exclusive of separately billable procedures and treating other patients. Critical care was necessary to treat or prevent imminent or life-threatening deterioration.  Critical care was time spent personally by me on the following activities: development of treatment plan with patient and/or surrogate as well as nursing, discussions with consultants, evaluation of patient's response to treatment, examination of patient, obtaining history from patient or surrogate, ordering and  performing treatments and interventions, ordering and review of laboratory studies, ordering and review of radiographic studies, pulse oximetry and re-evaluation of patient's condition.   Magdalen Spatz, MSN, AGACNP-BC [place] for personal pager/ CCM pager 08/17/2020, 8:44 AM

## 2020-08-18 ENCOUNTER — Inpatient Hospital Stay (HOSPITAL_COMMUNITY): Payer: Medicare (Managed Care)

## 2020-08-18 DIAGNOSIS — G9349 Other encephalopathy: Secondary | ICD-10-CM | POA: Diagnosis not present

## 2020-08-18 DIAGNOSIS — N19 Unspecified kidney failure: Secondary | ICD-10-CM | POA: Diagnosis not present

## 2020-08-18 LAB — COMPREHENSIVE METABOLIC PANEL
ALT: 16 U/L (ref 0–44)
AST: 25 U/L (ref 15–41)
Albumin: 2.8 g/dL — ABNORMAL LOW (ref 3.5–5.0)
Alkaline Phosphatase: 78 U/L (ref 38–126)
Anion gap: 18 — ABNORMAL HIGH (ref 5–15)
BUN: 35 mg/dL — ABNORMAL HIGH (ref 6–20)
CO2: 24 mmol/L (ref 22–32)
Calcium: 9.6 mg/dL (ref 8.9–10.3)
Chloride: 96 mmol/L — ABNORMAL LOW (ref 98–111)
Creatinine, Ser: 11.38 mg/dL — ABNORMAL HIGH (ref 0.61–1.24)
GFR, Estimated: 5 mL/min — ABNORMAL LOW (ref >60)
Glucose, Bld: 202 mg/dL — ABNORMAL HIGH (ref 70–99)
Potassium: 4.1 mmol/L (ref 3.5–5.1)
Sodium: 138 mmol/L (ref 135–145)
Total Bilirubin: 0.8 mg/dL (ref 0.3–1.2)
Total Protein: 6.6 g/dL (ref 6.5–8.1)

## 2020-08-18 LAB — FERRITIN: Ferritin: 2634 ng/mL — ABNORMAL HIGH (ref 24–336)

## 2020-08-18 LAB — PROCALCITONIN: Procalcitonin: 6.66 ng/mL

## 2020-08-18 LAB — CBC WITH DIFFERENTIAL/PLATELET
Abs Immature Granulocytes: 0 K/uL (ref 0.00–0.07)
Basophils Absolute: 0 K/uL (ref 0.0–0.1)
Basophils Relative: 0 %
Eosinophils Absolute: 0 K/uL (ref 0.0–0.5)
Eosinophils Relative: 0 %
HCT: 27.1 % — ABNORMAL LOW (ref 39.0–52.0)
Hemoglobin: 9 g/dL — ABNORMAL LOW (ref 13.0–17.0)
Lymphocytes Relative: 5 %
Lymphs Abs: 0.2 K/uL — ABNORMAL LOW (ref 0.7–4.0)
MCH: 25.9 pg — ABNORMAL LOW (ref 26.0–34.0)
MCHC: 33.2 g/dL (ref 30.0–36.0)
MCV: 78.1 fL — ABNORMAL LOW (ref 80.0–100.0)
Monocytes Absolute: 0.2 K/uL (ref 0.1–1.0)
Monocytes Relative: 4 %
Neutro Abs: 3.6 K/uL (ref 1.7–7.7)
Neutrophils Relative %: 90 %
Platelets: 231 K/uL (ref 150–400)
RBC: 3.47 MIL/uL — ABNORMAL LOW (ref 4.22–5.81)
RDW: 15.9 % — ABNORMAL HIGH (ref 11.5–15.5)
WBC: 4 K/uL (ref 4.0–10.5)
nRBC: 0 % (ref 0.0–0.2)

## 2020-08-18 LAB — C-REACTIVE PROTEIN: CRP: 10 mg/dL — ABNORMAL HIGH

## 2020-08-18 LAB — LACTATE DEHYDROGENASE: LDH: 287 U/L — ABNORMAL HIGH (ref 98–192)

## 2020-08-18 LAB — HEPATITIS B SURFACE ANTIBODY, QUANTITATIVE: Hep B S AB Quant (Post): 5.9 m[IU]/mL — ABNORMAL LOW (ref >9.9)

## 2020-08-18 LAB — FIBRINOGEN: Fibrinogen: 417 mg/dL (ref 210–475)

## 2020-08-18 LAB — D-DIMER, QUANTITATIVE: D-Dimer, Quant: 6.01 ug{FEU}/mL — ABNORMAL HIGH (ref 0.00–0.50)

## 2020-08-18 NOTE — Discharge Summary (Signed)
Physician Discharge Summary  Angel [name] MWU:[id number] DOB: 1971-07-29 DOA: 08/15/2020  PCP: Gwenlyn Saran, MD  Admit date: 08/15/2020 Discharge date: 08/18/2020  Admitted From: Home Disposition: Home  Recommendations for Outpatient Follow-up:  1. Follow up with PCP in 1-2 weeks 2. We will continue hemodialysis in the [facility] clinic on TTS schedule due to COVID 3. Please obtain BMP in one week  Home Health: No Equipment/Devices: None  Discharge Condition: Stable CODE STATUS: Full code Diet recommendation: Renal diet  History of present illness:  Angel [name] is a 50 year old male with past medical history significant for ESRD on HD TTS, history of CVA, history of seizure disorder, chronic systolic congestive heart failure who presented to the ED with confusion likely in the setting of uremia from missed HD sessions.  In the ED, patient was noted to have a new oxygen requirement of 5 L and incidentally found to be COVID-19 positive.  BUN was 130 with a creatinine of 34.  Initially admitted to the hospitalist service, and then required PCCM involvement as he was combative and required dexmedatomidine for compliance with medical therapy.  Patient was weaned off of dexmedatomidine and transferred from the PCCM service to hospitalist service on 08/18/2020.   Hospital course:  Acute metabolic encephalopathy 2/2 uremia ESRD on HD TTS Patient presented to ED with confusion.  Has missed several HD sessions recently.  BUN 130 and Creatinine 34.09 on admission.  Nephrology was consulted and patient underwent dialysis during his hospitalization with improvement of his creatinine down to 11.38 and BUN to 35.  Patient's confusion has resolved and he is alert and oriented and appropriate at time of discharge.  Discussed with nephrology, can continue HD outpatient, patient will be dialyzing at the [facility] clinic secondary to his COVID status.  Plan Next HD session on 08/19/2020.  Acute  hypoxic respiratory failure Acute pulmonary edema/volume overload 2/2 missed HD Patient presenting with encephalopathy with hypoxia requiring 5 L nasal count on presentation.  Etiology likely secondary to missed HD sessions as above.  Patient was dialyzed by nephrology while inpatient with resolution of his encephalopathy and hypoxia.  Patient will continue HD outpatient with next session planned 08/19/2020.  Covid-19 viral infection Patient was found to be positive for Covid-19 on 08/15/2020.  Chest x-ray consistent with pulmonary edema and CT angiogram chest negative for PE but with diffuse bilateral groundglass airspace opacities and small bilateral pleural effusions.  Etiology of his hypoxemia as above likely secondary to volume overload and pulmonary edema from missed HD rather than multifocal pneumonia from COVID-19 viral infection.  Patient received IV steroids while in the ICU and remdesivir.  We will not continue treatment outpatient as patient oxygenating well on room air without any significant dyspnea.  Essential hypertension Continue home amlodipine 10 mg p.o. daily, carvedilol 25 mg p.o. twice daily, furosemide 80 mg p.o. daily.  Continue aspirin and statin  Hyperlipidemia: Continue atorvastatin 80 mg p.o. daily  Discharge Diagnoses:  Active Problems:   ESRD (end stage renal disease) on dialysis ([name])   Anemia in ESRD (end-stage renal disease) ([name])   HTN (hypertension)   Dialysis patient, noncompliant ([name])   Chronic combined systolic and diastolic CHF (congestive heart failure) [facility])    Discharge Instructions  Discharge Instructions    Call MD for:  difficulty breathing, headache or visual disturbances   Complete by: As directed    Call MD for:  extreme fatigue   Complete by: As directed    Call MD for:  persistant dizziness or light-headedness   Complete by: As directed    Call MD for:  persistant nausea and vomiting   Complete by: As directed    Call MD for:  severe  uncontrolled pain   Complete by: As directed    Call MD for:  temperature >100.4   Complete by: As directed    Diet - low sodium heart healthy   Complete by: As directed    Increase activity slowly   Complete by: As directed      Allergies as of 08/18/2020      Reactions   Clonidine Derivatives Swelling, Other (See Comments)   Leg swelling   Epoetin (alfa)    Heparin Hives   Per dialysis records   Hydralazine Hcl Swelling, Other (See Comments)   Leg swelling- patient wants this stopped as early as possible if it's been ordered- can only tolerate for a short window of time   Irbesartan Other (See Comments)   Cannot tolerate this in higher doses- causes fluid retention   Losartan Potassium-hctz Other (See Comments)   "Raised my potassium"      Medication List    TAKE these medications   amLODipine 10 MG tablet Commonly known as: NORVASC Take 10 mg by mouth daily.   aspirin EC 81 MG tablet Take 81 mg by mouth daily.   atorvastatin 80 MG tablet Commonly known as: LIPITOR Take 80 mg by mouth at bedtime.   calcitRIOL 0.5 MCG capsule Commonly known as: ROCALTROL Take 0.5 mcg by mouth daily.   carvedilol 25 MG tablet Commonly known as: COREG Take 25 mg 2 (two) times daily with a meal by mouth.   cloNIDine 0.2 MG tablet Commonly known as: CATAPRES Take 0.2 mg by mouth at bedtime as needed (sleep). Pt states he takes as needed for sleep,this med makes him sleepy   Culturelle Caps Take 1 capsule by mouth daily.   furosemide 80 MG tablet Commonly known as: LASIX Take 80 mg at bedtime by mouth.   hydrOXYzine 10 MG tablet Commonly known as: ATARAX/VISTARIL Take 10 mg by mouth daily as needed for itching or anxiety.   minoxidil 2.5 MG tablet Commonly known as: LONITEN Take 2.5 mg by mouth 2 (two) times daily.   multivitamin Tabs tablet Take 1 tablet by mouth 2 (two) times daily.   OLANZapine 10 MG tablet Commonly known as: ZYPREXA Take 10 mg by mouth at  bedtime.   Omega III EPA+DHA 1000 MG Caps Take 1,000 mg by mouth daily.   pantoprazole 40 MG tablet Commonly known as: PROTONIX Take 40 mg by mouth daily.   sevelamer carbonate 800 MG tablet Commonly known as: RENVELA Take 1,600 mg by mouth See admin instructions. Take 1,600 mg by mouth three times a day with meals and 1,600 mg with each snack   tamsulosin 0.4 MG Caps capsule Commonly known as: FLOMAX Take 0.4 mg by mouth at bedtime.   Vitamin D (Ergocalciferol) 1.25 MG (50000 UNIT) Caps capsule Commonly known as: DRISDOL Take 50,000 Units by mouth every Friday.   WHITE PETROLATUM-MINERAL OIL OP Apply 1 application topically 2 (two) times daily as needed (itching).       Follow-up Information    Gwenlyn Saran, MD. Schedule an appointment as soon as possible for a visit in 1 week(s).   Specialty: Family Medicine Contact information: [address] [place] [address] [phone]        Burnell Blanks, MD .   Specialty:  Cardiology Contact information: [place] [place] [address] [phone]              Allergies  Allergen Reactions  . Clonidine Derivatives Swelling and Other (See Comments)    Leg swelling  . Epoetin (Alfa)   . Heparin Hives    Per dialysis records  . Hydralazine Hcl Swelling and Other (See Comments)    Leg swelling- patient wants this stopped as early as possible if it's been ordered- can only tolerate for a short window of time  . Irbesartan Other (See Comments)    Cannot tolerate this in higher doses- causes fluid retention  . Losartan Potassium-Hctz Other (See Comments)    "Raised my potassium"    Consultations:  Nephrology  Psychiatry   Procedures/Studies: CT ANGIO CHEST PE W OR WO CONTRAST  Result Date: 08/16/2020 CLINICAL DATA:  Fever and cough.  Lethargy. EXAM: CT ANGIOGRAPHY CHEST WITH CONTRAST TECHNIQUE: Multidetector CT imaging of the chest was performed using the  standard protocol during bolus administration of intravenous contrast. Multiplanar CT image reconstructions and MIPs were obtained to evaluate the vascular anatomy. CONTRAST:  16mL OMNIPAQUE IOHEXOL 350 MG/ML SOLN COMPARISON:  None. FINDINGS: Cardiovascular: Contrast injection is sufficient to demonstrate satisfactory opacification of the pulmonary arteries to the segmental level. There is no pulmonary embolus or evidence of right heart strain. The size of the main pulmonary artery is enlarged, measuring 4 cm. Cardiomegaly with coronary artery calcification. The course and caliber of the aorta are normal. There is mild atherosclerotic calcification. Opacification decreased due to pulmonary arterial phase contrast bolus timing. There is a well-positioned tunneled dialysis catheters. Mediastinum/Nodes: -- No mediastinal lymphadenopathy. -- No hilar lymphadenopathy. -- No axillary lymphadenopathy. -- No supraclavicular lymphadenopathy. -- Normal thyroid gland where visualized. -  Unremarkable esophagus. Lungs/Pleura: There are diffuse bilateral ground-glass airspace opacities. There is mixed atelectasis and consolidation at the right lung base. There are small bilateral pleural effusions. There are prominent interstitial lung markings. There is no pneumothorax. Upper Abdomen: Contrast bolus timing is not optimized for evaluation of the abdominal organs. As the partially visualized right kidney is atrophic. There is a large cystic structure in the right upper quadrant favored to represent a large hepatic cyst. Musculoskeletal: No chest wall abnormality. No bony spinal canal stenosis. Review of the MIP images confirms the above findings. IMPRESSION: 1. No acute pulmonary embolism. 2. Diffuse bilateral ground-glass airspace opacities, which may be secondary to developing pulmonary edema or an atypical infectious process. 3. Small bilateral pleural effusions. 4. Cardiomegaly with coronary artery disease. 5. Enlarged size  of the main pulmonary artery, which can be seen in patients with elevated pulmonary artery pressures. Aortic Atherosclerosis (ICD10-I70.0). Electronically Signed   By: Constance Holster M.D.   On: 08/16/2020 03:23   DG CHEST PORT 1 VIEW  Result Date: 08/18/2020 CLINICAL DATA:  Pneumonia.  COVID. EXAM: PORTABLE CHEST 1 VIEW COMPARISON:  CT 08/16/2020.  Chest x-ray 08/15/2020. FINDINGS: Right central line tip again noted over the right atrium. Stable cardiomegaly. No pulmonary venous congestion. Interim improvement of bilateral interstitial prominence. No focal alveolar infiltrate. No pleural effusion or pneumothorax. IMPRESSION: 1. Stable cardiomegaly. 2. Interim improvement of bilateral interstitial infiltrates/edema. Minimal residual. Electronically Signed   By: Marcello Moores  Register   On: 08/18/2020 06:56   DG Chest Portable 1 View  Result Date: 08/15/2020 CLINICAL DATA:  Shortness of breath and fever EXAM: PORTABLE CHEST 1 VIEW COMPARISON:  August 20, 2018 FINDINGS: There is a well-positioned tunneled  dialysis catheter on the right. There is cardiomegaly. There is vascular congestion with hazy airspace opacities bilaterally suggestive of mild interstitial edema. There is no large pleural effusion. No pneumothorax. IMPRESSION: 1. Cardiomegaly with findings of vascular congestion. 2. Hazy bilateral airspace opacities suggestive of mild interstitial edema. An atypical infectious process is difficult to entirely exclude. Electronically Signed   By: Constance Holster M.D.   On: 08/15/2020 19:15   VAS [place] LOWER EXTREMITY VENOUS (DVT)  Result Date: 08/17/2020  Lower Venous DVT Study Indications: Elevated Ddimer.  Risk Factors: COVID 19 positive. Limitations: Body habitus, poor ultrasound/tissue interface and patient positioning, patient movement. Comparison Study: No prior studies. Performing Technologist: Oliver Hum RVT  Examination Guidelines: A complete evaluation includes B-mode imaging, spectral  Doppler, color Doppler, and power Doppler as needed of all accessible portions of each vessel. Bilateral testing is considered an integral part of a complete examination. Limited examinations for reoccurring indications may be performed as noted. The reflux portion of the exam is performed with the patient in reverse Trendelenburg.  +---------+---------------+---------+-----------+----------+--------------+ RIGHT    CompressibilityPhasicitySpontaneityPropertiesThrombus Aging +---------+---------------+---------+-----------+----------+--------------+ CFV      Full           Yes      Yes                                 +---------+---------------+---------+-----------+----------+--------------+ SFJ      Full                                                        +---------+---------------+---------+-----------+----------+--------------+ FV Prox  Full                                                        +---------+---------------+---------+-----------+----------+--------------+ FV Mid   Full                                                        +---------+---------------+---------+-----------+----------+--------------+ FV DistalFull                                                        +---------+---------------+---------+-----------+----------+--------------+ PFV      Full                                                        +---------+---------------+---------+-----------+----------+--------------+ POP      Full           Yes      Yes                                 +---------+---------------+---------+-----------+----------+--------------+  PTV      Full                                                        +---------+---------------+---------+-----------+----------+--------------+ PERO     Full                                                        +---------+---------------+---------+-----------+----------+--------------+    +---------+---------------+---------+-----------+----------+--------------+ LEFT     CompressibilityPhasicitySpontaneityPropertiesThrombus Aging +---------+---------------+---------+-----------+----------+--------------+ CFV      Full           Yes      Yes                                 +---------+---------------+---------+-----------+----------+--------------+ SFJ      Full                                                        +---------+---------------+---------+-----------+----------+--------------+ FV Prox  Full                                                        +---------+---------------+---------+-----------+----------+--------------+ FV Mid   Full                                                        +---------+---------------+---------+-----------+----------+--------------+ FV DistalFull                                                        +---------+---------------+---------+-----------+----------+--------------+ PFV      Full                                                        +---------+---------------+---------+-----------+----------+--------------+ POP      Full           Yes      Yes                                 +---------+---------------+---------+-----------+----------+--------------+ PTV      Full                                                        +---------+---------------+---------+-----------+----------+--------------+  PERO     Full                                                        +---------+---------------+---------+-----------+----------+--------------+     Summary: RIGHT: - There is no evidence of deep vein thrombosis in the lower extremity. However, portions of this examination were limited- see technologist comments above.  - No cystic structure found in the popliteal fossa.  LEFT: - There is no evidence of deep vein thrombosis in the lower extremity. However, portions of this examination were  limited- see technologist comments above.  - No cystic structure found in the popliteal fossa.  *See table(s) above for measurements and observations. Electronically signed by Deitra Mayo MD on 08/17/2020 at 2:19:19 PM.    Final       Subjective: Patient seen and examined bedside, resting comfortably.  Standing at sink.  States ready to go home, no other complaints or concerns at this time.  Understands etiology related to his hospitalization with confusion and missed HD sessions.  Patient understands and has a plan to get to his next HD which is planned tomorrow.  Discussed with nephrology with no plans for HD today.  Denies headache, no fever/chills/night sweats, no nausea/vomiting/diarrhea, no chest pain, no palpitations, no shortness of breath, no abdominal pain, no weakness, no fatigue, no paresthesias.  No acute events overnight per nursing staff.  Discharge Exam: Vitals:   08/17/20 2238 08/18/20 0334  BP: (!) 135/110 (!) 104/59  Pulse: 89 84  Resp: 20 20  Temp: 98.2 F (36.8 C) 98 F (36.7 C)  SpO2: 93% 98%   Vitals:   08/17/20 2212 08/17/20 2217 08/17/20 2238 08/18/20 0334  BP: (!) 137/93 (!) 146/113 (!) 135/110 (!) 104/59  Pulse: 62 92 89 84  Resp:   20 20  Temp:  98.2 F (36.8 C) 98.2 F (36.8 C) 98 F (36.7 C)  TempSrc:      SpO2: 98%  93% 98%  Weight: 57.1 kg     Height:        General: Pt is alert, awake, not in acute distress Cardiovascular: RRR, S1/S2 +, no rubs, no gallops Respiratory: CTA bilaterally, no wheezing, no rhonchi Abdominal: Soft, NT, ND, bowel sounds + Extremities: no edema, no cyanosis    The results of significant diagnostics from this hospitalization (including imaging, microbiology, ancillary and laboratory) are listed below for reference.     Microbiology: Recent Results (from the past 240 hour(s))  Culture, blood (Routine x 2)     Status: None (Preliminary result)   Collection Time: 08/15/20  7:03 PM   Specimen: BLOOD RIGHT  FOREARM  Result Value Ref Range Status   Specimen Description BLOOD RIGHT FOREARM  Final   Special Requests   Final    BOTTLES DRAWN AEROBIC AND ANAEROBIC Blood Culture results may not be optimal due to an inadequate volume of blood received in culture bottles   Culture   Final    NO GROWTH 3 DAYS Performed at [facility], [place] [address], [place], [place] [address]    Report Status PENDING  Incomplete  Culture, blood (Routine x 2)     Status: None (Preliminary result)   Collection Time: 08/15/20  7:50 PM   Specimen: BLOOD  Result Value Ref Range Status   Specimen  Description BLOOD LEFT ANTECUBITAL  Final   Special Requests   Final    BOTTLES DRAWN AEROBIC AND ANAEROBIC Blood Culture results may not be optimal due to an inadequate volume of blood received in culture bottles   Culture   Final    NO GROWTH 3 DAYS Performed at [facility], [place] [address], [place], [place] [address]    Report Status PENDING  Incomplete  Resp Panel by RT-PCR (Flu A&B, Covid) Nasopharyngeal Swab     Status: Abnormal   Collection Time: 08/15/20  7:55 PM   Specimen: Nasopharyngeal Swab; Nasopharyngeal(NP) swabs in vial transport medium  Result Value Ref Range Status   SARS Coronavirus 2 by RT PCR POSITIVE (A) NEGATIVE Final    Comment: RESULT CALLED TO, READ BACK BY AND VERIFIED WITH: RN CHRISCO CHRIS BY MESSAN H. AT 2100 ON 08/15/2020 (NOTE) SARS-CoV-2 target nucleic acids are DETECTED.  The SARS-CoV-2 RNA is generally detectable in upper respiratory specimens during the acute phase of infection. Positive results are indicative of the presence of the identified virus, but do not rule out bacterial infection or co-infection with other pathogens not detected by the test. Clinical correlation with patient history and other diagnostic information is necessary to determine patient infection status. The expected result is Negative.  Fact Sheet for  Patients: EntrepreneurPulse.com.au  Fact Sheet for Healthcare Providers: IncredibleEmployment.be  This test is not yet approved or cleared by the [place] FDA and  has been authorized for detection and/or diagnosis of SARS-CoV-2 by FDA under an Emergency Use Authorization (EUA).  This EUA will remain in effect (meaning  this test can be used) for the duration of  the COVID-19 declaration under Section 564(b)(1) of the Act, 21 U.S.C. section 360bbb-3(b)(1), unless the authorization is terminated or revoked sooner.     Influenza A by PCR NEGATIVE NEGATIVE Final   Influenza B by PCR NEGATIVE NEGATIVE Final    Comment: (NOTE) The Xpert Xpress SARS-CoV-2/FLU/RSV plus assay is intended as an aid in the diagnosis of influenza from Nasopharyngeal swab specimens and should not be used as a sole basis for treatment. Nasal washings and aspirates are unacceptable for Xpert Xpress SARS-CoV-2/FLU/RSV testing.  Fact Sheet for Patients: EntrepreneurPulse.com.au  Fact Sheet for Healthcare Providers: IncredibleEmployment.be  This test is not yet approved or cleared by the [place] FDA and has been authorized for detection and/or diagnosis of SARS-CoV-2 by FDA under an Emergency Use Authorization (EUA). This EUA will remain in effect (meaning this test can be used) for the duration of the COVID-19 declaration under Section 564(b)(1) of the Act, 21 U.S.C. section 360bbb-3(b)(1), unless the authorization is terminated or revoked.  Performed at [facility], [place] [address], [place], [place] [address]   MRSA PCR Screening     Status: None   Collection Time: 08/16/20  2:00 PM   Specimen: Nasopharyngeal  Result Value Ref Range Status   MRSA by PCR NEGATIVE NEGATIVE Final    Comment:        The GeneXpert MRSA Assay (FDA approved for NASAL specimens only), is one component of a comprehensive MRSA  colonization surveillance program. It is not intended to diagnose MRSA infection nor to guide or monitor treatment for MRSA infections. Performed at [facility], [place] [address], [place], [place] [address]      Labs: BNP (last 3 results) No results for input(s): BNP in the last 8760 hours. Basic Metabolic Panel: Recent Labs  Lab 08/15/20 1921 08/15/20  2007 08/16/20 1333 08/16/20 2345 08/17/20 0643 08/18/20 0258  NA 138 136 137 135 137 138  K 5.7* 5.7* 5.9* 3.1* 5.2* 4.1  CL 97*  --  93* 94* 95* 96*  CO2 18*  --  17* 22 22 24   GLUCOSE 118*  --  130* 145* 162* 202*  BUN 130*  --  144* 44* 64* 35*  CREATININE 34.09*  --  34.53* 11.32* 18.93* 11.38*  CALCIUM 8.9  --  9.4 9.4 9.5 9.6  MG  --   --   --  2.3  --   --   PHOS  --   --  10.3*  --   --   --    Liver Function Tests: Recent Labs  Lab 08/15/20 1921 08/16/20 1333 08/17/20 0643 08/18/20 0258  AST 19  --  22 25  ALT 14  --  16 16  ALKPHOS 81  --  82 78  BILITOT 1.1  --  1.1 0.8  PROT 6.6  --  6.8 6.6  ALBUMIN 2.9* 2.7* 2.7* 2.8*   No results for input(s): LIPASE, AMYLASE in the last 168 hours. No results for input(s): AMMONIA in the last 168 hours. CBC: Recent Labs  Lab 08/15/20 1921 08/15/20 2007 08/16/20 1333 08/17/20 0643 08/18/20 0258  WBC 6.1  --  3.1* 4.0 4.0  NEUTROABS 5.3  --   --  3.7 3.6  HGB 8.4* 8.8* 8.2* 8.8* 9.0*  HCT 24.6* 26.0* 24.8* 26.3* 27.1*  MCV 79.9*  --  79.2* 78.3* 78.1*  PLT 145*  --  157 199 231   Cardiac Enzymes: No results for input(s): CKTOTAL, CKMB, CKMBINDEX, TROPONINI in the last 168 hours. BNP: Invalid input(s): POCBNP CBG: Recent Labs  Lab 08/16/20 1629 08/17/20 1216  GLUCAP 117* 198*   D-Dimer Recent Labs    08/17/20 0643 08/18/20 0258  DDIMER 7.91* 6.01*   Hgb A1c No results for input(s): HGBA1C in the last 72 hours. Lipid Profile Recent Labs    08/15/20 2234  TRIG 410*   Thyroid function studies No results for input(s): TSH, T4TOTAL,  T3FREE, THYROIDAB in the last 72 hours.  Invalid input(s): FREET3 Anemia work up Recent Labs    08/15/20 2234 08/18/20 0258  FERRITIN 2,962* 2,634*   Urinalysis    Component Value Date/Time   COLORURINE YELLOW (A) 01/04/2018 1030   APPEARANCEUR CLEAR (A) 01/04/2018 1030   LABSPEC 1.004 (L) 01/04/2018 1030   PHURINE 9.0 (H) 01/04/2018 1030   GLUCOSEU NEGATIVE 01/04/2018 1030   HGBUR NEGATIVE 01/04/2018 1030   [place] 01/04/2018 1030   [place] 01/04/2018 1030   PROTEINUR 100 (A) 01/04/2018 1030   NITRITE NEGATIVE 01/04/2018 1030   LEUKOCYTESUR NEGATIVE 01/04/2018 1030   Sepsis Labs Invalid input(s): PROCALCITONIN,  WBC,  LACTICIDVEN Microbiology Recent Results (from the past 240 hour(s))  Culture, blood (Routine x 2)     Status: None (Preliminary result)   Collection Time: 08/15/20  7:03 PM   Specimen: BLOOD RIGHT FOREARM  Result Value Ref Range Status   Specimen Description BLOOD RIGHT FOREARM  Final   Special Requests   Final    BOTTLES DRAWN AEROBIC AND ANAEROBIC Blood Culture results may not be optimal due to an inadequate volume of blood received in culture bottles   Culture   Final    NO GROWTH 3 DAYS Performed at [facility], [place] [address] [place], [place] [id number]    Report Status PENDING  Incomplete  Culture,  blood (Routine x 2)     Status: None (Preliminary result)   Collection Time: 08/15/20  7:50 PM   Specimen: BLOOD  Result Value Ref Range Status   Specimen Description BLOOD LEFT ANTECUBITAL  Final   Special Requests   Final    BOTTLES DRAWN AEROBIC AND ANAEROBIC Blood Culture results may not be optimal due to an inadequate volume of blood received in culture bottles   Culture   Final    NO GROWTH 3 DAYS Performed at [facility], [place] [address], [place], [place] [address]    Report Status PENDING  Incomplete  Resp Panel by RT-PCR (Flu A&B, Covid) Nasopharyngeal Swab     Status: Abnormal   Collection Time:  08/15/20  7:55 PM   Specimen: Nasopharyngeal Swab; Nasopharyngeal(NP) swabs in vial transport medium  Result Value Ref Range Status   SARS Coronavirus 2 by RT PCR POSITIVE (A) NEGATIVE Final    Comment: RESULT CALLED TO, READ BACK BY AND VERIFIED WITH: RN CHRISCO CHRIS BY MESSAN H. AT 2100 ON 08/15/2020 (NOTE) SARS-CoV-2 target nucleic acids are DETECTED.  The SARS-CoV-2 RNA is generally detectable in upper respiratory specimens during the acute phase of infection. Positive results are indicative of the presence of the identified virus, but do not rule out bacterial infection or co-infection with other pathogens not detected by the test. Clinical correlation with patient history and other diagnostic information is necessary to determine patient infection status. The expected result is Negative.  Fact Sheet for Patients: EntrepreneurPulse.com.au  Fact Sheet for Healthcare Providers: IncredibleEmployment.be  This test is not yet approved or cleared by the [place] FDA and  has been authorized for detection and/or diagnosis of SARS-CoV-2 by FDA under an Emergency Use Authorization (EUA).  This EUA will remain in effect (meaning  this test can be used) for the duration of  the COVID-19 declaration under Section 564(b)(1) of the Act, 21 U.S.C. section 360bbb-3(b)(1), unless the authorization is terminated or revoked sooner.     Influenza A by PCR NEGATIVE NEGATIVE Final   Influenza B by PCR NEGATIVE NEGATIVE Final    Comment: (NOTE) The Xpert Xpress SARS-CoV-2/FLU/RSV plus assay is intended as an aid in the diagnosis of influenza from Nasopharyngeal swab specimens and should not be used as a sole basis for treatment. Nasal washings and aspirates are unacceptable for Xpert Xpress SARS-CoV-2/FLU/RSV testing.  Fact Sheet for Patients: EntrepreneurPulse.com.au  Fact Sheet for Healthcare  Providers: IncredibleEmployment.be  This test is not yet approved or cleared by the [place] FDA and has been authorized for detection and/or diagnosis of SARS-CoV-2 by FDA under an Emergency Use Authorization (EUA). This EUA will remain in effect (meaning this test can be used) for the duration of the COVID-19 declaration under Section 564(b)(1) of the Act, 21 U.S.C. section 360bbb-3(b)(1), unless the authorization is terminated or revoked.  Performed at [facility], [place] [address] [place], [place] [address]   MRSA PCR Screening     Status: None   Collection Time: 08/16/20  2:00 PM   Specimen: Nasopharyngeal  Result Value Ref Range Status   MRSA by PCR NEGATIVE NEGATIVE Final    Comment:        The GeneXpert MRSA Assay (FDA approved for NASAL specimens only), is one component of a comprehensive MRSA colonization surveillance program. It is not intended to diagnose MRSA infection nor to guide or monitor treatment for MRSA infections. Performed at [facility], [place] [address],  [place], [place] [address]      Time coordinating discharge: Over 30 minutes  SIGNED:   Shadell Brenn J [place] DO  [facility] 08/18/2020, 10:22 AM

## 2020-08-18 NOTE — Discharge Instructions (Signed)
COVID-19 Quarantine vs. Isolation QUARANTINE keeps someone who was in close contact with someone who has COVID-19 away from others. Quarantine if you have been in close contact with someone who has COVID-19, unless you have been fully vaccinated. If you are fully vaccinated  You do NOT need to quarantine unless they have symptoms  Get tested 3-5 days after your exposure, even if you don't have symptoms  Wear a mask indoors in public for 14 days following exposure or until your test result is negative If you are not fully vaccinated  Stay home for 14 days after your last contact with a person who has COVID-19  Watch for fever (100.23F), cough, shortness of breath, or other symptoms of COVID-19  If possible, stay away from people you live with, especially people who are at higher risk for getting very sick from COVID-19  Contact your local public health department for options in your area to possibly shorten your quarantine ISOLATION keeps someone who is sick or tested positive for COVID-19 without symptoms away from others, even in their own home. People who are in isolation should stay home and stay in a specific "sick room" or area and use a separate bathroom (if available). If you are sick and think or know you have COVID-19 Stay home until after  At least 10 days since symptoms first appeared and  At least 24 hours with no fever without the use of fever-reducing medications and  Symptoms have improved If you tested positive for COVID-19 but do not have symptoms  Stay home until after 10 days have passed since your positive viral test  If you develop symptoms after testing positive, follow the steps above for those who are sick [email or web address] 05/03/2020 This information is not intended to replace advice given to you by your health care provider. Make sure you discuss any questions you have with your health care provider. Document Revised: 06/07/2020 Document Reviewed:  06/07/2020 Elsevier Patient Education  2021 [place] Can Do to Manage Your COVID-19 Symptoms at Home If you have possible or confirmed COVID-19: 1. Stay home except to get medical care. 2. Monitor your symptoms carefully. If your symptoms get worse, call your healthcare provider immediately. 3. Get rest and stay hydrated. 4. If you have a medical appointment, call the healthcare provider ahead of time and tell them that you have or may have COVID-19. 5. For medical emergencies, call 911 and notify the dispatch personnel that you have or may have COVID-19. 6. Cover your cough and sneezes with a tissue or use the inside of your elbow. 7. Wash your hands often with soap and water for at least 20 seconds or clean your hands with an alcohol-based hand sanitizer that contains at least 60% alcohol. 8. As much as possible, stay in a specific room and away from other people in your home. Also, you should use a separate bathroom, if available. If you need to be around other people in or outside of the home, wear a mask. 9. Avoid sharing personal items with other people in your household, like dishes, towels, and bedding. 10. Clean all surfaces that are touched often, like counters, tabletops, and doorknobs. Use household cleaning sprays or wipes according to the label instructions. [email or web address] 02/20/2020 This information is not intended to replace advice given to you by your health care provider. Make sure you discuss any questions you have with your health care provider. Document Revised: 06/07/2020 Document Reviewed: 06/07/2020 Elsevier Patient Education  [place].

## 2020-08-18 NOTE — Progress Notes (Signed)
[facility] Progress Note  Subjective: pt seen in room, out of ICU, fully alert and remembers being confused 2 days ago.   Vitals:   08/17/20 2212 08/17/20 2217 08/17/20 2238 08/18/20 0334  BP: (!) 137/93 (!) 146/113 (!) 135/110 (!) 104/59  Pulse: 62 92 89 84  Resp:   20 20  Temp:  98.2 F (36.8 C) 98.2 F (36.8 C) 98 F (36.7 C)  TempSrc:      SpO2: 98%  93% 98%  Weight: 57.1 kg     Height:        Exam:   alert, Ox 3  no jvd  Chest cta bilat to base  Cor reg no RG  Abd soft ntnd no ascites   Ext no LE edema   Alert, nonfocal, minimal asterixis   R IJ TDC     OP HD: [name]  - 3h 70min  63kg  Hep none   CXR - hazy bilateral airspace opacities suggestive of mild interstitial edema. An atypical infectious process is difficult to entirely exclude   CTA chest - diffuse bilateral ground-glass airspace opacities, which may be secondary to developing pulmonary edema or an atypical infectious Process. Small bilateral pleural effusions.     Assessment/ Plan: # ESRD - on HD TTS - pt refused HD yest and required ICU admit w/ precedex drip to enable dialysis. Had HD 1/10 and again on 1/11 yesterday. He is much better and okay for dc today, have d/w pmd. He has outpt HD tomorrow at his usual center.   # AMS / uremia - resolved w/ HD x 2  # COVID + - per pmd  # Acute hypoxic respiratory failure - on 3-4 L [place], patchy ground glass areas on CTA chest, suspect fluid overload  - down to RA today after vol removal w/ HD  # HTN with CKD  - resume home meds at dc  # Anemia of CKD - Hb mid to high 8's  # Metabolic bone disease - resume home meds  Rob Jossalyn Forgione 08/18/2020, 10:22 AM   Recent Labs  Lab 08/16/20 1333 08/16/20 2345 08/17/20 0643 08/18/20 0258  K 5.9*   < > 5.2* 4.1  BUN 144*   < > 64* 35*  CREATININE 34.53*   < > 18.93* 11.38*  CALCIUM 9.4   < > 9.5 9.6  PHOS 10.3*  --   --   --   HGB 8.2*  --  8.8* 9.0*   < > = values  in this interval not displayed.   Inpatient medications: . amLODipine  10 mg Oral Daily  . apixaban  2.5 mg Oral BID  . carvedilol  25 mg Oral BID WC  . Chlorhexidine Gluconate Cloth  6 each Topical Q0600  . mouth rinse  15 mL Mouth Rinse BID  . methylPREDNISolone (SOLU-MEDROL) injection  60 mg Intravenous Q12H  . OLANZapine  2.5 mg Oral QHS   . ceFEPime (MAXIPIME) IV Stopped (08/16/20 2345)  . dexmedetomidine (PRECEDEX) IV infusion Stopped (08/16/20 1944)  . remdesivir 100 mg in NS 100 mL Stopped (08/17/20 1343)   acetaminophen, haloperidol lactate, labetalol, ondansetron **OR** ondansetron (ZOFRAN) IV

## 2020-08-18 NOTE — TOC Progression Note (Signed)
Transition of Care [facility]) - Progression Note    Patient Details  Name: Hesham Womac MRN: [id number] Date of Birth: 20-Nov-1970  Transition of Care [facility]) CM/SW Contact  Joanne Chars, LCSW Phone Number: 08/18/2020, 1:07 PM  Clinical Narrative: IVC commitment change form completed and faxed to magistrate.  Confirmed with magistrate that this is all they need.  Original placed on chart.           Expected Discharge Plan and Services           Expected Discharge Date: 08/18/20                                     Social Determinants of Health (SDOH) Interventions    Readmission Risk Interventions No flowsheet data found.

## 2020-08-18 NOTE — Progress Notes (Signed)
Patient noncompliant with medications. Refused all blood pressure meds and blood thjinners.  Verbalized understanding of d/c instructions. D/c home with Mother

## 2020-08-18 NOTE — Progress Notes (Signed)
Renal Navigator appreciates update from Attending/Dr. [place] that patient will be discharged today. Navigator contacted [facility] in [place] to inquire about schedule and learned that it is TTS 12:45pm. Navigator will fax discharge records to clinic and asked that they expect him for treatment starting tomorrow. He will need to treat in isolation for 21 days. Navigator contacted patient to inform. He stated understanding and that he drives himself to dialysis. He states his mother is "on stand-by."  No barriers to discharge today.  Alphonzo Cruise,  Renal Navigator [phone]

## 2020-08-20 LAB — CULTURE, BLOOD (ROUTINE X 2)
Culture: NO GROWTH
Culture: NO GROWTH

## 2020-09-07 ENCOUNTER — Emergency Department (HOSPITAL_COMMUNITY)
Admission: EM | Admit: 2020-09-07 | Discharge: 2020-09-07 | Disposition: A | Discharge status: Home / Self Care | Payer: Medicare (Managed Care) | Attending: Emergency Medicine | Admitting: Emergency Medicine

## 2020-09-07 ENCOUNTER — Encounter (HOSPITAL_COMMUNITY): Payer: Self-pay | Admitting: Emergency Medicine

## 2020-09-07 ENCOUNTER — Other Ambulatory Visit: Payer: Self-pay

## 2020-09-07 DIAGNOSIS — Z5321 Procedure and treatment not carried out due to patient leaving prior to being seen by health care provider: Secondary | ICD-10-CM | POA: Insufficient documentation

## 2020-09-07 DIAGNOSIS — Z01812 Encounter for preprocedural laboratory examination: Secondary | ICD-10-CM | POA: Insufficient documentation

## 2020-09-07 LAB — CBC
HCT: 16.7 % — ABNORMAL LOW (ref 39.0–52.0)
Hemoglobin: 5.4 g/dL — CL (ref 13.0–17.0)
MCH: 26.9 pg (ref 26.0–34.0)
MCHC: 32.3 g/dL (ref 30.0–36.0)
MCV: 83.1 fL (ref 80.0–100.0)
Platelets: 172 10*3/uL (ref 150–400)
RBC: 2.01 MIL/uL — ABNORMAL LOW (ref 4.22–5.81)
RDW: 16.1 % — ABNORMAL HIGH (ref 11.5–15.5)
WBC: 4.7 10*3/uL (ref 4.0–10.5)
nRBC: 0 % (ref 0.0–0.2)

## 2020-09-07 LAB — BASIC METABOLIC PANEL
Anion gap: 26 — ABNORMAL HIGH (ref 5–15)
BUN: 157 mg/dL — ABNORMAL HIGH (ref 6–20)
CO2: 18 mmol/L — ABNORMAL LOW (ref 22–32)
Calcium: 9.4 mg/dL (ref 8.9–10.3)
Chloride: 96 mmol/L — ABNORMAL LOW (ref 98–111)
Creatinine, Ser: 35.49 mg/dL — ABNORMAL HIGH (ref 0.61–1.24)
GFR, Estimated: 1 mL/min — ABNORMAL LOW (ref >60)
Glucose, Bld: 99 mg/dL (ref 70–99)
Potassium: 6 mmol/L — ABNORMAL HIGH (ref 3.5–5.1)
Sodium: 140 mmol/L (ref 135–145)

## 2020-09-07 NOTE — ED Notes (Signed)
Pt no answer for ready room.

## 2020-09-07 NOTE — ED Triage Notes (Signed)
Pt arrives to ED with c/o of needing labwork to return to dialysis. Pt states his dialysis center needs to know his K before return. D/c on 1/12 with dx of metabolic encephalopathy. Has missed last dialysis treatment.

## 2020-09-16 ENCOUNTER — Other Ambulatory Visit: Payer: Self-pay

## 2020-09-16 ENCOUNTER — Encounter: Payer: Self-pay | Admitting: *Deleted

## 2020-09-16 ENCOUNTER — Emergency Department
Admission: EM | Admit: 2020-09-16 | Discharge: 2020-09-16 | Disposition: A | Discharge status: Home / Self Care | Payer: Medicare (Managed Care) | Attending: Emergency Medicine | Admitting: Emergency Medicine

## 2020-09-16 DIAGNOSIS — Z7982 Long term (current) use of aspirin: Secondary | ICD-10-CM | POA: Insufficient documentation

## 2020-09-16 DIAGNOSIS — D649 Anemia, unspecified: Secondary | ICD-10-CM

## 2020-09-16 DIAGNOSIS — Z992 Dependence on renal dialysis: Secondary | ICD-10-CM | POA: Diagnosis not present

## 2020-09-16 DIAGNOSIS — N186 End stage renal disease: Secondary | ICD-10-CM | POA: Diagnosis not present

## 2020-09-16 DIAGNOSIS — Z79899 Other long term (current) drug therapy: Secondary | ICD-10-CM | POA: Insufficient documentation

## 2020-09-16 DIAGNOSIS — I5042 Chronic combined systolic (congestive) and diastolic (congestive) heart failure: Secondary | ICD-10-CM | POA: Insufficient documentation

## 2020-09-16 DIAGNOSIS — I132 Hypertensive heart and chronic kidney disease with heart failure and with stage 5 chronic kidney disease, or end stage renal disease: Secondary | ICD-10-CM | POA: Insufficient documentation

## 2020-09-16 LAB — CBC WITH DIFFERENTIAL/PLATELET
Abs Immature Granulocytes: 0.02 10*3/uL (ref 0.00–0.07)
Basophils Absolute: 0 10*3/uL (ref 0.0–0.1)
Basophils Relative: 1 %
Eosinophils Absolute: 0.2 10*3/uL (ref 0.0–0.5)
Eosinophils Relative: 2 %
HCT: 18.5 % — ABNORMAL LOW (ref 39.0–52.0)
Hemoglobin: 6 g/dL — ABNORMAL LOW (ref 13.0–17.0)
Immature Granulocytes: 0 %
Lymphocytes Relative: 12 %
Lymphs Abs: 0.8 10*3/uL (ref 0.7–4.0)
MCH: 27 pg (ref 26.0–34.0)
MCHC: 32.4 g/dL (ref 30.0–36.0)
MCV: 83.3 fL (ref 80.0–100.0)
Monocytes Absolute: 0.8 10*3/uL (ref 0.1–1.0)
Monocytes Relative: 12 %
Neutro Abs: 4.7 10*3/uL (ref 1.7–7.7)
Neutrophils Relative %: 73 %
Platelets: 238 10*3/uL (ref 150–400)
RBC: 2.22 MIL/uL — ABNORMAL LOW (ref 4.22–5.81)
RDW: 16.2 % — ABNORMAL HIGH (ref 11.5–15.5)
WBC: 6.6 10*3/uL (ref 4.0–10.5)
nRBC: 0 % (ref 0.0–0.2)

## 2020-09-16 LAB — BASIC METABOLIC PANEL
Anion gap: 18 — ABNORMAL HIGH (ref 5–15)
BUN: 56 mg/dL — ABNORMAL HIGH (ref 6–20)
CO2: 24 mmol/L (ref 22–32)
Calcium: 9.1 mg/dL (ref 8.9–10.3)
Chloride: 100 mmol/L (ref 98–111)
Creatinine, Ser: 14 mg/dL — ABNORMAL HIGH (ref 0.61–1.24)
GFR, Estimated: 4 mL/min — ABNORMAL LOW (ref >60)
Glucose, Bld: 153 mg/dL — ABNORMAL HIGH (ref 70–99)
Potassium: 3.8 mmol/L (ref 3.5–5.1)
Sodium: 142 mmol/L (ref 135–145)

## 2020-09-16 LAB — TYPE AND SCREEN

## 2020-09-16 MED ORDER — SODIUM CHLORIDE 0.9 % IV SOLN: 10.0000 mL/h | Freq: Once | INTRAVENOUS | Status: DC

## 2020-09-16 NOTE — Discharge Instructions (Addendum)
Go to dialysis on 11

## 2020-09-16 NOTE — ED Notes (Signed)
.  E-signature not working at this time. Pt verbalized understanding of D/C instructions, prescriptions and follow up care with no further questions at this time. Pt in NAD and ambulatory at time of D/C. Pt instructed to go straight to dialysis.

## 2020-09-16 NOTE — ED Provider Notes (Addendum)
[facility] Emergency Department Provider Note  ____________________________________________  Time seen: Approximately 5:40 AM  I have reviewed the triage vital signs and the nursing notes.   HISTORY  Chief Complaint Abnormal Lab   HPI Angel [name] is a 50 y.o. male with a history of ESRD on HD who presents for acute on chronic anemia.  Patient is supposed to go to dialysis today.  He was told by the dialysis nurse that he needed to come to the hospital for a blood transfusion since his hemoglobin on the first was 5.4.  Patient denies dizziness, chest pain, shortness of breath, fatigue.  Patient has received 7 blood transfusions in the past.  He denies any active bleeding, melena, hematochezia, hematemesis, coffee-ground emesis, epistaxis, hematuria.  He is supposed to undergo dialysis treatment this morning 9 AM.  Past Medical History:  Diagnosis Date  . AAA (abdominal aortic aneurysm) ([place])   . Chronic anemia   . Chronic combined systolic and diastolic CHF (congestive heart failure) ([place])   . End stage renal disease ([place])   . ESRD on dialysis ([place])   . FSGS (focal segmental glomerulosclerosis)   . GERD (gastroesophageal reflux disease)   . HLD (hyperlipidemia)   . Hypertension   . NSTEMI (non-ST elevated myocardial infarction) ([place]) 07/2017   pt refused cath  . Pericardial effusion    a. small by echo 06/2017.  [place] Kitchen Renal disorder   . Seizure ([place])   . Stroke (cerebrum) [facility])     Patient Active Problem List   Diagnosis Date Noted  . Anemia 08/17/2018  . Acute renal failure (ARF) ([place]) 04/03/2018  . Chronic combined systolic and diastolic CHF (congestive heart failure) ([place]) 08/14/2017  . Thrombocytopenia ([place]) 07/30/2017  . Dialysis patient, noncompliant ([place])   . HTN (hypertension) 07/13/2017  . Non-ST elevation (NSTEMI) myocardial infarction ([place])   . Elevated troponin   . Hyperkalemia 06/20/2017  . GERD (gastroesophageal reflux  disease) 06/20/2017  . Seizure disorder ([place]) 06/20/2017  . Abnormal LFTs 06/20/2017  . Anemia in ESRD (end-stage renal disease) ([place]) 06/20/2017  . HLD (hyperlipidemia)   . Stroke (cerebrum) ([place])   . ESRD (end stage renal disease) on dialysis ([place])   . Acute on chronic diastolic CHF (congestive heart failure) ([place])   . Acute post-operative pain 02/14/2017  . Acute respiratory insufficiency 02/14/2017  . Febrile nonhemolytic transfusion reaction 02/13/2017  . FSGS (focal segmental glomerulosclerosis) 02/06/2017  . History of stroke 09/13/2016  . Palpitations 09/13/2016  . Abdominal aortic aneurysm (AAA) without rupture ([place]) 07/21/2016  . 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 Pre-transplant evaluation for kidney transplant 03/20/2016    Past Surgical History:  Procedure Laterality Date  . ABDOMINAL AORTIC ANEURYSM REPAIR    . KIDNEY SURGERY      Prior to Admission medications   Medication Sig Start Date End Date Taking? Authorizing Provider  amLODipine (NORVASC) 10 MG tablet Take 10 mg by mouth daily.    [provider]  aspirin EC 81 MG tablet Take 81 mg by mouth daily.    [provider]  atorvastatin (LIPITOR) 80 MG tablet Take 80 mg by mouth at bedtime.    [provider]  calcitRIOL (ROCALTROL) 0.5 MCG capsule Take 0.5 mcg by mouth daily.     [provider]  carvedilol (COREG) 25 MG tablet Take 25 mg 2 (two) times daily with a meal by mouth.    [provider]  cloNIDine (CATAPRES) 0.2 MG tablet  Take 0.2 mg by mouth at bedtime as needed (sleep). Pt states he takes as needed for sleep,this med makes him sleepy 10/19/17   [provider]  furosemide (LASIX) 80 MG tablet Take 80 mg at bedtime by mouth.    [provider]  hydrOXYzine (ATARAX/VISTARIL) 10 MG tablet Take 10 mg by mouth daily as needed for itching or anxiety.     [provider]  Lactobacillus Rhamnosus, GG, (CULTURELLE) CAPS Take 1  capsule by mouth daily.    [provider]  minoxidil (LONITEN) 2.5 MG tablet Take 2.5 mg by mouth 2 (two) times daily. 12/02/19   [provider]  multivitamin (RENA-VIT) TABS tablet Take 1 tablet by mouth 2 (two) times daily.    [provider]  OLANZapine (ZYPREXA) 10 MG tablet Take 10 mg by mouth at bedtime. 01/02/19   [provider]  Omega-3 Fatty Acids (OMEGA III EPA+DHA) 1000 MG CAPS Take 1,000 mg by mouth daily.     [provider]  pantoprazole (PROTONIX) 40 MG tablet Take 40 mg by mouth daily.  07/03/16   [provider]  sevelamer carbonate (RENVELA) 800 MG tablet Take 1,600 mg by mouth See admin instructions. Take 1,600 mg by mouth three times a day with meals and 1,600 mg with each snack    [provider]  tamsulosin (FLOMAX) 0.4 MG CAPS capsule Take 0.4 mg by mouth at bedtime. 12/09/18   [provider]  Vitamin D, Ergocalciferol, (DRISDOL) 1.25 MG (50000 UNIT) CAPS capsule Take 50,000 Units by mouth every Friday.    [provider]  WHITE PETROLATUM-MINERAL OIL OP Apply 1 application topically 2 (two) times daily as needed (itching).  03/12/17   [provider]    Allergies Clonidine derivatives, Epoetin (alfa), Heparin, Hydralazine hcl, Irbesartan, and Losartan potassium-hctz  Family History  Problem Relation Age of Onset  . Hypertension Mother   . Kidney disease Mother   . Asthma Father   . Hypertension Father   . Kidney disease Father   . Hypertension Sister   . Heart disease Sister   . Kidney disease Sister   . Heart disease Brother   . Hypertension Brother   . COPD Brother     Social History Social History   Tobacco Use  . Smoking status: Never Smoker  . Smokeless tobacco: Never Used  Vaping Use  . Vaping Use: Never used  Substance Use Topics  . Alcohol use: No  . Drug use: No    Review of Systems  Constitutional: Negative for fever. Eyes: Negative for visual  changes. ENT: Negative for sore throat. Neck: No neck pain  Cardiovascular: Negative for chest pain. Respiratory: Negative for shortness of breath. Gastrointestinal: Negative for abdominal pain, vomiting or diarrhea. Genitourinary: Negative for dysuria. Musculoskeletal: Negative for back pain. Skin: Negative for rash. Neurological: Negative for headaches, weakness or numbness. Psych: No SI or HI  ____________________________________________   PHYSICAL EXAM:  VITAL SIGNS: ED Triage Vitals  Enc Vitals Group     BP 09/16/20 0513 (!) 153/106     Pulse Rate 09/16/20 0513 (!) 109     Resp 09/16/20 0513 16     Temp 09/16/20 0513 98.6 F (37 C)     Temp Source 09/16/20 0513 Oral     SpO2 09/16/20 0513 100 %     Weight 09/16/20 0511 130 lb (59 kg)     Height 09/16/20 0511 '5\' 5"'$  (1.651 m)     Head Circumference --  Peak Flow --      Pain Score 09/16/20 0511 0     Pain Loc --      Pain Edu? --      Excl. in [place]? --     Constitutional: Alert and oriented. Well appearing and in no apparent distress. HEENT:      Head: Normocephalic and atraumatic.         Eyes: Conjunctivae are normal. Sclera is non-icteric.       Mouth/Throat: Mucous membranes are moist.       Neck: Supple with no signs of meningismus. Cardiovascular: Regular rate and rhythm. No murmurs, gallops, or rubs. Respiratory: Normal respiratory effort. Lungs are clear to auscultation bilaterally.  Gastrointestinal: Soft, non tender. Musculoskeletal: No edema, cyanosis, or erythema of extremities. Neurologic: Normal speech and language. Face is symmetric. Moving all extremities. No gross focal neurologic deficits are appreciated. Skin: Skin is warm, dry and intact. No rash noted. Psychiatric: Mood and affect are normal. Speech and behavior are normal.  ____________________________________________   LABS (all labs ordered are listed, but only abnormal results are displayed)  Labs Reviewed  CBC WITH  DIFFERENTIAL/PLATELET - Abnormal; Notable for the following components:      Result Value   RBC 2.22 (*)    Hemoglobin 6.0 (*)    HCT 18.5 (*)    RDW 16.2 (*)    All other components within normal limits  BASIC METABOLIC PANEL - Abnormal; Notable for the following components:   Glucose, Bld 153 (*)    BUN 56 (*)    Creatinine, Ser 14.00 (*)    GFR, Estimated 4 (*)    Anion gap 18 (*)    All other components within normal limits  TYPE AND SCREEN  PREPARE RBC (CROSSMATCH)  TYPE AND SCREEN   ____________________________________________  EKG  none  ____________________________________________  RADIOLOGY  none  ____________________________________________   PROCEDURES  Procedure(s) performed: None Procedures Critical Care performed:  Yes  CRITICAL CARE Performed by: Rudene Re  ?  Total critical care time: 30 min  Critical care time was exclusive of separately billable procedures and treating other patients.  Critical care was necessary to treat or prevent imminent or life-threatening deterioration.  Critical care was time spent personally by me on the following activities: development of treatment plan with patient and/or surrogate as well as nursing, discussions with consultants, evaluation of patient's response to treatment, examination of patient, obtaining history from patient or surrogate, ordering and performing treatments and interventions, ordering and review of laboratory studies, ordering and review of radiographic studies, pulse oximetry and re-evaluation of patient's condition.  ____________________________________________   INITIAL IMPRESSION / ASSESSMENT AND PLAN / ED COURSE   50 y.o. male with a history of ESRD on HD who presents for acute on chronic anemia.  Patient told to come to the ED for blood transfusion since his hemoglobin 9 days ago was 5.4.  Repeat hemoglobin today 6.0.  Patient is asymptomatic.  Will discuss with his nephrologist,  Dr. Holley Raring for treatment recommendations since patient is due for HD in 3 hours.  At this time there is no indications of emergent dialysis with K of 3.8, normal work of breathing, normal sats.  Old medical records reviewed.  _________________________ 5:55 AM on 09/16/2020 -----------------------------------------  Discussed with Dr. Holley Raring who recommended transfusing 1 unit of pRBC and discharging patient to be dialyzed at his center at [facility] discussed with patient who is in agreement.    _____________________________________________ Please note:  Patient  was evaluated in Emergency Department today for the symptoms described in the history of present illness. Patient was evaluated in the context of the global COVID-19 pandemic, which necessitated consideration that the patient might be at risk for infection with the SARS-CoV-2 virus that causes COVID-19. Institutional protocols and algorithms that pertain to the evaluation of patients at risk for COVID-19 are in a state of rapid change based on information released by regulatory bodies including the CDC and federal and state organizations. These policies and algorithms were followed during the patient's care in the ED.  Some ED evaluations and interventions may be delayed as a result of limited staffing during the pandemic.   [place] Controlled Substance Database was reviewed by me. ____________________________________________   FINAL CLINICAL IMPRESSION(S) / ED DIAGNOSES   Final diagnoses:  Acute on chronic anemia      NEW MEDICATIONS STARTED DURING THIS VISIT:  ED Discharge Orders    None       Note:  This document was prepared using Dragon voice recognition software and may include unintentional dictation errors.    Alfred Levins, [place], MD 09/16/20 [place], [place], MD 09/25/20 [id number]

## 2020-09-16 NOTE — ED Provider Notes (Signed)
7:11 AM Assumed care for off going team.   Blood pressure (!) 153/106, pulse (!) 109, temperature 98.6 F (37 C), temperature source Oral, resp. rate 16, height '5\' 5"'$  (1.651 m), weight 59 kg, SpO2 100 %.  See their HPI for full report but in brief Dialysis pt, hemoglobin 5.4 9 days ago--plan to transfuse 1 unit per Dr. Holley Raring. Dialysis at 11AM.   10:19 AM patient is doing well post transfusion.  He understands the plan to get dialysis.  This time we will discharge patient home.  Patient is well-appearing at time of discharge          Vanessa Gladwin, MD 09/16/20 1019

## 2020-09-16 NOTE — ED Notes (Signed)
Contacted by lab and notified of hemolyzed sample for type and screen. Sample redrawn and sent to lab.

## 2020-09-16 NOTE — ED Triage Notes (Signed)
Patient ambulatory to triage with steady gait, without difficulty or distress noted; pt reports that he was told his hgb is low (5.4) and that he needed to come to hosp; dialysis due today

## 2020-09-16 NOTE — ED Notes (Signed)
Redraw of pink tube sent to lab.

## 2020-09-17 LAB — TYPE AND SCREEN
ABO/RH(D): A POS
Antibody Screen: NEGATIVE
Unit division: 0
Unit division: 0

## 2020-09-17 LAB — BPAM RBC
Blood Product Expiration Date: 202203112359
Blood Product Expiration Date: 202203112359
ISSUE DATE / TIME: 202202100904
ISSUE DATE / TIME: 202202110842
Unit Type and Rh: 6200
Unit Type and Rh: 6200

## 2020-09-17 LAB — PREPARE RBC (CROSSMATCH)

## 2021-04-07 DEATH — deceased
